# Patient Record
Sex: Male | Born: 1965 | Race: White | Hispanic: No | Smoking: Former smoker
Health system: Southern US, Community
[De-identification: ages and names within clinical notes are randomized; demographics above are authoritative.]

## PROBLEM LIST (undated history)

## (undated) DIAGNOSIS — E785 Hyperlipidemia, unspecified: Secondary | ICD-10-CM

## (undated) DIAGNOSIS — R350 Frequency of micturition: Secondary | ICD-10-CM

## (undated) DIAGNOSIS — B20 Human immunodeficiency virus [HIV] disease: Secondary | ICD-10-CM

## (undated) DIAGNOSIS — M1990 Unspecified osteoarthritis, unspecified site: Secondary | ICD-10-CM

## (undated) DIAGNOSIS — Z9889 Other specified postprocedural states: Secondary | ICD-10-CM

## (undated) DIAGNOSIS — K219 Gastro-esophageal reflux disease without esophagitis: Secondary | ICD-10-CM

## (undated) DIAGNOSIS — Z21 Asymptomatic human immunodeficiency virus [HIV] infection status: Secondary | ICD-10-CM

## (undated) DIAGNOSIS — I10 Essential (primary) hypertension: Secondary | ICD-10-CM

## (undated) DIAGNOSIS — R21 Rash and other nonspecific skin eruption: Secondary | ICD-10-CM

## (undated) DIAGNOSIS — K648 Other hemorrhoids: Secondary | ICD-10-CM

## (undated) DIAGNOSIS — N3941 Urge incontinence: Secondary | ICD-10-CM

## (undated) DIAGNOSIS — B181 Chronic viral hepatitis B without delta-agent: Secondary | ICD-10-CM

## (undated) DIAGNOSIS — R112 Nausea with vomiting, unspecified: Secondary | ICD-10-CM

## (undated) HISTORY — DX: Other hemorrhoids: K64.8

## (undated) HISTORY — PX: FOOT SURGERY: SHX648

## (undated) HISTORY — DX: Asymptomatic human immunodeficiency virus (hiv) infection status: Z21

## (undated) HISTORY — DX: Chronic viral hepatitis B without delta-agent: B18.1

## (undated) HISTORY — DX: Essential (primary) hypertension: I10

## (undated) HISTORY — DX: Hyperlipidemia, unspecified: E78.5

## (undated) HISTORY — DX: Human immunodeficiency virus (HIV) disease: B20

---
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
## 1995-11-25 HISTORY — PX: OTHER SURGICAL HISTORY: SHX169

## 1998-03-19 ENCOUNTER — Encounter: Admission: RE | Admit: 1998-03-19 | Discharge: 1998-03-19 | Payer: Self-pay | Admitting: Internal Medicine

## 1998-04-13 ENCOUNTER — Emergency Department (HOSPITAL_COMMUNITY): Admission: EM | Admit: 1998-04-13 | Discharge: 1998-04-13 | Payer: Self-pay | Admitting: *Deleted

## 1998-05-11 ENCOUNTER — Encounter: Admission: RE | Admit: 1998-05-11 | Discharge: 1998-05-11 | Payer: Self-pay | Admitting: Internal Medicine

## 1998-06-26 ENCOUNTER — Emergency Department (HOSPITAL_COMMUNITY): Admission: EM | Admit: 1998-06-26 | Discharge: 1998-06-26 | Payer: Self-pay | Admitting: Emergency Medicine

## 1998-07-09 ENCOUNTER — Encounter: Admission: RE | Admit: 1998-07-09 | Discharge: 1998-07-09 | Payer: Self-pay | Admitting: Internal Medicine

## 1998-08-14 ENCOUNTER — Emergency Department (HOSPITAL_COMMUNITY): Admission: EM | Admit: 1998-08-14 | Discharge: 1998-08-14 | Payer: Self-pay | Admitting: Emergency Medicine

## 1998-09-12 ENCOUNTER — Ambulatory Visit (HOSPITAL_COMMUNITY): Admission: RE | Admit: 1998-09-12 | Discharge: 1998-09-12 | Payer: Self-pay | Admitting: Internal Medicine

## 1998-09-16 ENCOUNTER — Emergency Department (HOSPITAL_COMMUNITY): Admission: EM | Admit: 1998-09-16 | Discharge: 1998-09-16 | Payer: Self-pay | Admitting: Emergency Medicine

## 1998-09-17 ENCOUNTER — Encounter: Payer: Self-pay | Admitting: Emergency Medicine

## 1998-09-19 ENCOUNTER — Emergency Department (HOSPITAL_COMMUNITY): Admission: EM | Admit: 1998-09-19 | Discharge: 1998-09-19 | Payer: Self-pay | Admitting: Emergency Medicine

## 1998-11-24 HISTORY — PX: OTHER SURGICAL HISTORY: SHX169

## 2004-11-24 HISTORY — PX: CHOLECYSTECTOMY: SHX55

## 2006-03-25 ENCOUNTER — Encounter: Payer: Self-pay | Admitting: Emergency Medicine

## 2008-11-24 HISTORY — PX: UMBILICAL HERNIA REPAIR: SHX196

## 2009-08-24 HISTORY — PX: HAMMER TOE SURGERY: SHX385

## 2012-02-09 ENCOUNTER — Emergency Department (HOSPITAL_COMMUNITY)
Admission: EM | Admit: 2012-02-09 | Discharge: 2012-02-10 | Disposition: A | Discharge status: Home / Self Care | Payer: PRIVATE HEALTH INSURANCE | Attending: Emergency Medicine | Admitting: Emergency Medicine

## 2012-02-09 ENCOUNTER — Other Ambulatory Visit (HOSPITAL_COMMUNITY): Payer: Self-pay | Admitting: Pharmacy Technician

## 2012-02-09 ENCOUNTER — Encounter (HOSPITAL_COMMUNITY): Payer: Self-pay | Admitting: *Deleted

## 2012-02-09 DIAGNOSIS — M79609 Pain in unspecified limb: Secondary | ICD-10-CM | POA: Insufficient documentation

## 2012-02-09 DIAGNOSIS — L299 Pruritus, unspecified: Secondary | ICD-10-CM | POA: Insufficient documentation

## 2012-02-09 DIAGNOSIS — Z21 Asymptomatic human immunodeficiency virus [HIV] infection status: Secondary | ICD-10-CM | POA: Insufficient documentation

## 2012-02-09 DIAGNOSIS — L0390 Cellulitis, unspecified: Secondary | ICD-10-CM

## 2012-02-09 HISTORY — DX: Asymptomatic human immunodeficiency virus (hiv) infection status: Z21

## 2012-02-09 LAB — POCT I-STAT, CHEM 8
BUN: 19 mg/dL (ref 6–23)
Creatinine, Ser: 1.2 mg/dL (ref 0.50–1.35)
Sodium: 144 mEq/L (ref 135–145)
TCO2: 22 mmol/L (ref 0–100)

## 2012-02-09 LAB — CBC
HCT: 40.7 % (ref 39.0–52.0)
Hemoglobin: 14.1 g/dL (ref 13.0–17.0)
MCH: 30.9 pg (ref 26.0–34.0)
MCHC: 34.6 g/dL (ref 30.0–36.0)
MCV: 89.3 fL (ref 78.0–100.0)
Platelets: 207 10*3/uL (ref 150–400)
RBC: 4.56 MIL/uL (ref 4.22–5.81)
RDW: 13 % (ref 11.5–15.5)
WBC: 10.4 10*3/uL (ref 4.0–10.5)

## 2012-02-09 LAB — DIFFERENTIAL
Basophils Absolute: 0 10*3/uL (ref 0.0–0.1)
Eosinophils Absolute: 0.2 10*3/uL (ref 0.0–0.7)
Eosinophils Relative: 2 % (ref 0–5)
Lymphocytes Relative: 37 % (ref 12–46)
Monocytes Absolute: 1.1 10*3/uL — ABNORMAL HIGH (ref 0.1–1.0)

## 2012-02-09 MED ORDER — HYDROMORPHONE HCL PF 1 MG/ML IJ SOLN
0.5000 mg | Freq: Once | INTRAMUSCULAR | Status: AC
  Administered 2012-02-09: 0.5 mg via INTRAVENOUS
  Filled 2012-02-09: qty 1

## 2012-02-09 MED ORDER — SODIUM CHLORIDE 0.9 % IV SOLN
Freq: Once | INTRAVENOUS | Status: AC
  Administered 2012-02-09: via INTRAVENOUS

## 2012-02-09 NOTE — ED Notes (Signed)
Pt states he has re occuring skin lesion.pt states they are painful at times and want he them examine

## 2012-02-10 ENCOUNTER — Ambulatory Visit (HOSPITAL_COMMUNITY)
Admission: RE | Admit: 2012-02-10 | Discharge: 2012-02-10 | Disposition: A | Discharge status: Home / Self Care | Payer: Medicare Other | Source: Ambulatory Visit | Attending: Emergency Medicine | Admitting: Emergency Medicine

## 2012-02-10 DIAGNOSIS — M79609 Pain in unspecified limb: Secondary | ICD-10-CM

## 2012-02-10 DIAGNOSIS — M79606 Pain in leg, unspecified: Secondary | ICD-10-CM

## 2012-02-10 DIAGNOSIS — M7989 Other specified soft tissue disorders: Secondary | ICD-10-CM | POA: Insufficient documentation

## 2012-02-10 MED ORDER — CEPHALEXIN 500 MG PO CAPS: 500.0000 mg | ORAL_CAPSULE | Freq: Four times a day (QID) | ORAL | Status: AC

## 2012-02-10 MED ORDER — OXYCODONE-ACETAMINOPHEN 10-325 MG PO TABS: 1.0000 | ORAL_TABLET | ORAL | Status: AC | PRN

## 2012-02-10 MED ORDER — ENOXAPARIN SODIUM 100 MG/ML ~~LOC~~ SOLN
100.0000 mg | Freq: Once | SUBCUTANEOUS | Status: AC
  Administered 2012-02-10: 100 mg via SUBCUTANEOUS
  Filled 2012-02-10: qty 1

## 2012-02-10 MED ORDER — CEPHALEXIN 500 MG PO CAPS
500.0000 mg | ORAL_CAPSULE | Freq: Once | ORAL | Status: AC
  Administered 2012-02-10: 500 mg via ORAL
  Filled 2012-02-10: qty 1

## 2012-02-10 NOTE — ED Provider Notes (Signed)
Pt had surgery on his left foot to repair a hammer toe and had a donor site from his left calf. Since then has had lesions/infections that have been treated by dermatologists in [place] and [place]. He states he has been on keflex/clindamycin oral and topical and other topical antibiotics, and he gets better but it returns. This episode started a week ago, no trauma or fever. He states he has itching deep inside and pain. He states his leg swells and gets better depending on how much he is up on it.  Pt has an area of firmness around the site that he states they obtained tissue to repair his hammer toe with mild redness and warmth.   Medical screening examination/treatment/procedure(s) were conducted as a shared visit with non-physician practitioner(s) and myself.  I personally evaluated the patient during the encounter Devoria Albe, MD, Franz Dell, MD 02/10/12 [phone]

## 2012-02-10 NOTE — ED Provider Notes (Signed)
See prior note   Ward Givens, MD 02/10/12 [phone]

## 2012-02-10 NOTE — Progress Notes (Signed)
VASCULAR LAB PRELIMINARY  PRELIMINARY  PRELIMINARY  PRELIMINARY  Left lower extremity venous duplex completed.    Preliminary report:  Left:  No evidence of DVT, superficial thrombosis, or Baker's cyst.  Carneshia Raker D, RVS 02/10/2012, 2:19 PM

## 2012-02-10 NOTE — ED Provider Notes (Signed)
History     CSN: [id number]  Arrival date & time 02/09/12  1819   First MD Initiated Contact with Patient 02/09/12 2115      Chief Complaint  Patient presents with  . Leg Pain    (Consider location/radiation/quality/duration/timing/severity/associated sxs/prior treatment) Patient is a 46 y.o. male presenting with leg pain. The history is provided by the patient.  Leg Pain  The incident occurred more than 2 days ago. There was no injury mechanism. Associated symptoms comments: The patient had a previous orthopedic procedure (left hammer toe correction with donor site from left leg) over 2 years ago and has had recurrent infections at or near the donor site since that time. He has been given topical as well as oral antibiotics at different times. Over the last week, he has had increasing pain and tightness in the leg similar to previous presentations without fever. He reports the pain is worse than usual. .    Past Medical History  Diagnosis Date  . HIV positive     Past Surgical History  Procedure Date  . Foot surgery     History reviewed. No pertinent family history.  History  Substance Use Topics  . Smoking status: Never Smoker   . Smokeless tobacco: Not on file  . Alcohol Use: No      Review of Systems  Constitutional: Negative for fever and chills.  HENT: Negative.   Respiratory: Negative.   Cardiovascular: Negative.   Gastrointestinal: Negative.   Musculoskeletal: Negative.        See HPI  Skin: Negative.   Neurological: Negative.     Allergies  Doxycycline and Lyrica  Home Medications   Current Outpatient Rx  Name Route Sig Dispense Refill  . DIAZEPAM 5 MG PO TABS Oral Take 10 mg by mouth at bedtime as needed.    . DULOXETINE HCL 60 MG PO CPEP Oral Take 120 mg by mouth at bedtime.    . EFAVIRENZ-EMTRICITAB-TENOFOVIR 600-200-300 MG PO TABS Oral Take 1 tablet by mouth at bedtime.    Marland Kitchen ESOMEPRAZOLE MAGNESIUM 40 MG PO CPDR Oral Take 40 mg by mouth daily  before breakfast.    . HYDROCODONE-ACETAMINOPHEN 7.5-325 MG PO TABS Oral Take 1 tablet by mouth every 6 (six) hours as needed. For pain relief    . ADULT MULTIVITAMIN W/MINERALS CH Oral Take 1 tablet by mouth daily.      BP 165/84  Pulse 86  Temp(Src) 98.8 F (37.1 C) (Oral)  Resp 16  Wt 221 lb (100.245 kg)  SpO2 98%  Physical Exam  Constitutional: He is oriented to person, place, and time. He appears well-developed and well-nourished.  Neck: Normal range of motion.  Pulmonary/Chest: Effort normal.  Musculoskeletal: Normal range of motion. He exhibits tenderness.       Left lower leg without gross swelling, or redness. There is a lesion medially at mid-shaft that is tender, tense in the immediate area surrounding the lesion. There is no drainage. Calf is soft but extremely tender. Distal pulses are present.   Neurological: He is alert and oriented to person, place, and time.  Skin: Skin is warm and dry. No erythema.  Psychiatric: He has a normal mood and affect.    ED Course  Procedures (including critical care time)  Labs Reviewed  DIFFERENTIAL - Abnormal; Notable for the following:    Monocytes Absolute 1.1 (*)    All other components within normal limits  CBC  D-DIMER, QUANTITATIVE  POCT I-STAT, CHEM 8  SEDIMENTATION  RATE  C-REACTIVE PROTEIN   No results found.   No diagnosis found.    MDM  Feel likely that this is recurrence of previous cutaneous infections given normal lab studies and history of same. Consider DVT despite negative D-dimer given degree of pain of calf so will get doppler study in the morning. Will discharge home with Keflex and increase pain medication to Percocet for better control.        Rodena Medin, PA-C 02/10/12 [phone]

## 2012-02-10 NOTE — Discharge Instructions (Signed)
GO TO [facility] TOMORROW AT 1:00 TO HAVE A DOPPLER STUDY OF LOWER LEG. STOP VICODIN AND USE PERCOCET FOR PAIN AS DIRECTED. TAKE KEFLEX AS DIRECTED FOR INFECTION. RETURN HERE WITH ANY SEVERE PAIN, HIGH FEVER OR NEW CONCERN.

## 2012-05-04 ENCOUNTER — Other Ambulatory Visit: Payer: Self-pay | Admitting: Urology

## 2012-06-11 ENCOUNTER — Encounter (HOSPITAL_BASED_OUTPATIENT_CLINIC_OR_DEPARTMENT_OTHER): Payer: Self-pay | Admitting: *Deleted

## 2012-06-11 NOTE — Progress Notes (Signed)
NPO AFTER MN. ARRIVES AT 0615. NEEDS ISTAT (HIV +)  AND EKG.

## 2012-06-16 NOTE — Anesthesia Preprocedure Evaluation (Addendum)
Anesthesia Evaluation  Patient identified by MRN, date of birth, ID band Patient awake    Reviewed: Allergy & Precautions, H&P , NPO status , Patient's Chart, lab work & pertinent test results  History of Anesthesia Complications (+) PONV  Airway Mallampati: II TM Distance: >3 FB Neck ROM: Full    Dental  (+) Teeth Intact, Caps, Partial Lower and Partial Upper   Pulmonary former smoker,  breath sounds clear to auscultation  Pulmonary exam normal       Cardiovascular negative cardio ROS  Rhythm:Regular Rate:Normal     Neuro/Psych negative neurological ROS  negative psych ROS   GI/Hepatic Neg liver ROS, GERD-  Medicated,  Endo/Other  Morbid obesity  Renal/GU negative Renal ROS  negative genitourinary   Musculoskeletal negative musculoskeletal ROS (+)   Abdominal   Peds negative pediatric ROS (+)  Hematology  (+) HIV,   Anesthesia Other Findings   Reproductive/Obstetrics negative OB ROS                           Anesthesia Physical Anesthesia Plan  ASA: III  Anesthesia Plan: MAC   Post-op Pain Management:    Induction: Intravenous  Airway Management Planned: Simple Face Mask  Additional Equipment:   Intra-op Plan:   Post-operative Plan:   Informed Consent: I have reviewed the patients History and Physical, chart, labs and discussed the procedure including the risks, benefits and alternatives for the proposed anesthesia with the patient or authorized representative who has indicated his/her understanding and acceptance.   Dental advisory given  Plan Discussed with: CRNA  Anesthesia Plan Comments:         Anesthesia Quick Evaluation

## 2012-06-16 NOTE — H&P (Signed)
History of Present Illness   Mr. Sky is ecstatic about the Interstim PNE test stimulation on the left side. He never tried the right. He is now voiding only 7 times a day and his urge incontinence and frequency was really severe, voiding up to 3-4 times in an hour. His urgency has settling as well. He has failed multiple medications. His mild decrease in flow is stable. Review of systems: No change in bowel or neurologic status.   Urinalysis: I reviewed, negative.  Incisions look fine and the PNE was removed.   He is allergic to penicillin and Vancomycin will be given preoperatively.    Past Medical History Problems  1. History of  Arthritis V13.4 2. History of  Hepatitis, C Virus 070.70 3. History of  HIV Infection 042 4. 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 History of  Nephrolithiasis V13.01 6. History of  Nocturia 788.43  Current Meds 1. Acetaminophen 325 MG Oral Tablet; Therapy: (Recorded:20Mar2013) to 2. Amitriptyline HCl 10 MG Oral Tablet; Therapy: (Recorded:20Mar2013) to 3. Atripla 600-200-300 MG Oral Tablet; Therapy: (Recorded:20Mar2013) to 4. Cialis 20 MG Oral Tablet; TAKE 1 TABLET 1 HOUR BEFORE ACTIVITY AS NEEDED; Therapy:  08May2013 to (Last Rx:08May2013) 5. Diazepam 5 MG Oral Tablet; Therapy: (Recorded:20Mar2013) to 6. Ditropan XL 15 MG Oral Tablet Extended Release 24 Hour; Therapy: (Recorded:20Mar2013) to 7. Flomax 0.4 MG Oral Capsule; Therapy: (Recorded:20Mar2013) to 8. Hydrocodone-Acetaminophen 7.5-500 MG Oral Tablet; Therapy: (Recorded:20Mar2013) to 9. NexIUM 40 MG Oral Capsule Delayed Release; Therapy: (Recorded:20Mar2013) to  Allergies Medication  1. Ampicillin CAPS 2. Doxycycline Hyclate CAPS 3. Penicillins 4. Tetracyclines  Social History Problems  1. Marital History - Single 2. Occupation: disabled  Results/Data  Urine [Data Includes: Last 1 Day]   10Jun2013  COLOR YELLOW   APPEARANCE CLEAR   SPECIFIC GRAVITY 1.030   pH 6.0     GLUCOSE NEG mg/dL  BILIRUBIN NEG   KETONE NEG mg/dL  BLOOD NEG   PROTEIN NEG mg/dL  UROBILINOGEN 0.2 mg/dL  NITRITE NEG   LEUKOCYTE ESTERASE NEG    Assessment Assessed  1. Urinary Frequency 788.41 2. 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 Brandis would like to have Interstim. It will be scheduled.   After a thorough review of the management options for the patient's condition the patient  elected to proceed with surgical therapy as noted above. We have discussed the potential benefits and risks of the procedure, side effects of the proposed treatment, the likelihood of the patient achieving the goals of the procedure, and any potential problems that might occur during the procedure or recuperation. Informed consent has been obtained.

## 2012-06-17 ENCOUNTER — Ambulatory Visit (HOSPITAL_BASED_OUTPATIENT_CLINIC_OR_DEPARTMENT_OTHER): Payer: Medicare Other | Admitting: Anesthesiology

## 2012-06-17 ENCOUNTER — Encounter (HOSPITAL_BASED_OUTPATIENT_CLINIC_OR_DEPARTMENT_OTHER)
Admission: RE | Disposition: A | Discharge status: Home / Self Care | Payer: Self-pay | Source: Ambulatory Visit | Attending: Urology

## 2012-06-17 ENCOUNTER — Encounter (HOSPITAL_BASED_OUTPATIENT_CLINIC_OR_DEPARTMENT_OTHER): Payer: Self-pay | Admitting: *Deleted

## 2012-06-17 ENCOUNTER — Ambulatory Visit (HOSPITAL_BASED_OUTPATIENT_CLINIC_OR_DEPARTMENT_OTHER)
Admission: RE | Admit: 2012-06-17 | Discharge: 2012-06-17 | Disposition: A | Discharge status: Home / Self Care | Payer: Medicare Other | Source: Ambulatory Visit | Attending: Urology | Admitting: Urology

## 2012-06-17 ENCOUNTER — Encounter (HOSPITAL_BASED_OUTPATIENT_CLINIC_OR_DEPARTMENT_OTHER): Payer: Self-pay | Admitting: Anesthesiology

## 2012-06-17 ENCOUNTER — Ambulatory Visit (HOSPITAL_COMMUNITY): Payer: Medicare Other

## 2012-06-17 DIAGNOSIS — Z87442 Personal history of urinary calculi: Secondary | ICD-10-CM | POA: Insufficient documentation

## 2012-06-17 DIAGNOSIS — B1920 Unspecified viral hepatitis C without hepatic coma: Secondary | ICD-10-CM | POA: Insufficient documentation

## 2012-06-17 DIAGNOSIS — R351 Nocturia: Secondary | ICD-10-CM | POA: Insufficient documentation

## 2012-06-17 DIAGNOSIS — N3941 Urge incontinence: Secondary | ICD-10-CM | POA: Insufficient documentation

## 2012-06-17 DIAGNOSIS — B20 Human immunodeficiency virus [HIV] disease: Secondary | ICD-10-CM | POA: Insufficient documentation

## 2012-06-17 DIAGNOSIS — R350 Frequency of micturition: Secondary | ICD-10-CM | POA: Insufficient documentation

## 2012-06-17 DIAGNOSIS — Z79899 Other long term (current) drug therapy: Secondary | ICD-10-CM | POA: Insufficient documentation

## 2012-06-17 DIAGNOSIS — N318 Other neuromuscular dysfunction of bladder: Secondary | ICD-10-CM | POA: Insufficient documentation

## 2012-06-17 HISTORY — DX: Rash and other nonspecific skin eruption: R21

## 2012-06-17 HISTORY — DX: Nausea with vomiting, unspecified: R11.2

## 2012-06-17 HISTORY — DX: Urge incontinence: N39.41

## 2012-06-17 HISTORY — DX: Other specified postprocedural states: Z98.890

## 2012-06-17 HISTORY — DX: Unspecified osteoarthritis, unspecified site: M19.90

## 2012-06-17 HISTORY — DX: Gastro-esophageal reflux disease without esophagitis: K21.9

## 2012-06-17 HISTORY — PX: INTERSTIM IMPLANT PLACEMENT: SHX5130

## 2012-06-17 HISTORY — DX: Frequency of micturition: R35.0

## 2012-06-17 LAB — POCT I-STAT 4, (NA,K, GLUC, HGB,HCT)
Glucose, Bld: 96 mg/dL (ref 70–99)
HCT: 45 % (ref 39.0–52.0)
Hemoglobin: 15.3 g/dL (ref 13.0–17.0)
Potassium: 3.9 mEq/L (ref 3.5–5.1)
Sodium: 144 mEq/L (ref 135–145)

## 2012-06-17 SURGERY — INSERTION, SACRAL NERVE STIMULATOR, INTERSTIM, STAGE 1
Anesthesia: Monitor Anesthesia Care | Site: Back | Wound class: Clean

## 2012-06-17 MED ORDER — CEPHALEXIN 250 MG PO CAPS: 250.0000 mg | ORAL_CAPSULE | Freq: Three times a day (TID) | ORAL | Status: AC

## 2012-06-17 MED ORDER — LACTATED RINGERS IV SOLN: INTRAVENOUS | Status: DC

## 2012-06-17 MED ORDER — HYDROCODONE-ACETAMINOPHEN 5-500 MG PO TABS: 1.0000 | ORAL_TABLET | Freq: Four times a day (QID) | ORAL | Status: DC | PRN

## 2012-06-17 MED ORDER — FENTANYL CITRATE 0.05 MG/ML IJ SOLN
INTRAMUSCULAR | Status: DC | PRN
  Administered 2012-06-17: 25 ug via INTRAVENOUS
  Administered 2012-06-17: 50 ug via INTRAVENOUS
  Administered 2012-06-17 (×3): 25 ug via INTRAVENOUS

## 2012-06-17 MED ORDER — PROMETHAZINE HCL 25 MG/ML IJ SOLN: 6.2500 mg | INTRAMUSCULAR | Status: DC | PRN

## 2012-06-17 MED ORDER — HYDROMORPHONE HCL PF 1 MG/ML IJ SOLN
0.2500 mg | INTRAMUSCULAR | Status: DC | PRN
  Administered 2012-06-17: 0.25 mg via INTRAVENOUS

## 2012-06-17 MED ORDER — SCOPOLAMINE 1 MG/3DAYS TD PT72
1.0000 | MEDICATED_PATCH | TRANSDERMAL | Status: DC
  Administered 2012-06-17: 1.5 mg via TRANSDERMAL

## 2012-06-17 MED ORDER — PROPOFOL 10 MG/ML IV EMUL
INTRAVENOUS | Status: DC | PRN
  Administered 2012-06-17: 25 ug/kg/min via INTRAVENOUS

## 2012-06-17 MED ORDER — PROPOFOL 10 MG/ML IV EMUL
INTRAVENOUS | Status: DC | PRN
  Administered 2012-06-17: 40 mg via INTRAVENOUS

## 2012-06-17 MED ORDER — BUPIVACAINE-EPINEPHRINE 0.5% -1:200000 IJ SOLN
INTRAMUSCULAR | Status: DC | PRN
  Administered 2012-06-17: 30 mL

## 2012-06-17 MED ORDER — VANCOMYCIN HCL IN DEXTROSE 1-5 GM/200ML-% IV SOLN
1000.0000 mg | INTRAVENOUS | Status: AC
  Administered 2012-06-17: 1000 mg via INTRAVENOUS

## 2012-06-17 MED ORDER — LACTATED RINGERS IV SOLN
INTRAVENOUS | Status: DC
  Administered 2012-06-17: 07:00:00 via INTRAVENOUS

## 2012-06-17 MED ORDER — STERILE WATER FOR IRRIGATION IR SOLN
Status: DC | PRN
  Administered 2012-06-17: 500 mL/h

## 2012-06-17 MED ORDER — ONDANSETRON HCL 4 MG/2ML IJ SOLN
INTRAMUSCULAR | Status: DC | PRN
  Administered 2012-06-17: 4 mg via INTRAVENOUS

## 2012-06-17 MED ORDER — MIDAZOLAM HCL 5 MG/5ML IJ SOLN
INTRAMUSCULAR | Status: DC | PRN
  Administered 2012-06-17: 2 mg via INTRAVENOUS

## 2012-06-17 MED ORDER — GENTAMICIN IN SALINE 1.6-0.9 MG/ML-% IV SOLN
80.0000 mg | INTRAVENOUS | Status: AC
  Administered 2012-06-17: 80 mg via INTRAVENOUS

## 2012-06-17 MED ORDER — LIDOCAINE-EPINEPHRINE (PF) 1 %-1:200000 IJ SOLN
INTRAMUSCULAR | Status: DC | PRN
  Administered 2012-06-17: 30 mL

## 2012-06-17 SURGICAL SUPPLY — 53 items
BAG URINE DRAINAGE (UROLOGICAL SUPPLIES) ×2 IMPLANT
BAG URINE LEG 500ML (DRAIN) IMPLANT
BANDAGE ADHESIVE 1X3 (GAUZE/BANDAGES/DRESSINGS) IMPLANT
BENZOIN TINCTURE PRP APPL 2/3 (GAUZE/BANDAGES/DRESSINGS) ×4 IMPLANT
BLADE HEX COATED 2.75 (ELECTRODE) ×2 IMPLANT
BLADE SURG 15 STRL LF DISP TIS (BLADE) ×1 IMPLANT
BLADE SURG 15 STRL SS (BLADE) ×1
CATH FOLEY 2WAY SLVR  5CC 16FR (CATHETERS) ×1
CATH FOLEY 2WAY SLVR 5CC 16FR (CATHETERS) ×1 IMPLANT
CLOTH BEACON ORANGE TIMEOUT ST (SAFETY) ×2 IMPLANT
COVER MAYO STAND STRL (DRAPES) ×2 IMPLANT
COVER PROBE W GEL 5X96 (DRAPES) ×2 IMPLANT
COVER TABLE BACK 60X90 (DRAPES) ×2 IMPLANT
DERMABOND ADVANCED (GAUZE/BANDAGES/DRESSINGS) ×1
DERMABOND ADVANCED .7 DNX12 (GAUZE/BANDAGES/DRESSINGS) ×1 IMPLANT
DRAPE C-ARM 42X72 X-RAY (DRAPES) ×2 IMPLANT
DRAPE INCISE 23X17 IOBAN STRL (DRAPES) ×1
DRAPE INCISE IOBAN 23X17 STRL (DRAPES) ×1 IMPLANT
DRAPE LAPAROSCOPIC ABDOMINAL (DRAPES) ×2 IMPLANT
DRAPE LG THREE QUARTER DISP (DRAPES) ×2 IMPLANT
DRESSING TELFA 8X3 (GAUZE/BANDAGES/DRESSINGS) ×2 IMPLANT
DRSG TEGADERM 4X4.75 (GAUZE/BANDAGES/DRESSINGS) ×2 IMPLANT
ELECT REM PT RETURN 9FT ADLT (ELECTROSURGICAL) ×2
ELECTRODE REM PT RTRN 9FT ADLT (ELECTROSURGICAL) ×1 IMPLANT
GAUZE SPONGE 4X4 12PLY STRL LF (GAUZE/BANDAGES/DRESSINGS) ×4 IMPLANT
GLOVE BIO SURGEON STRL SZ7.5 (GLOVE) ×2 IMPLANT
GLOVE INDICATOR 6.5 STRL GRN (GLOVE) ×2 IMPLANT
GOWN PREVENTION PLUS LG XLONG (DISPOSABLE) ×2 IMPLANT
GOWN STRL REIN XL XLG (GOWN DISPOSABLE) ×2 IMPLANT
HOLDER FOLEY CATH W/STRAP (MISCELLANEOUS) IMPLANT
INTRODUCER GUIDE DILATR SHEATH (SET/KITS/TRAYS/PACK) ×2 IMPLANT
LEAD (Lead) ×2 IMPLANT
NEEDLE FORAMEN 20GA 3.5  9CM (NEEDLE) IMPLANT
NEEDLE FORAMEN 20GA 5  12.5CM (NEEDLE) IMPLANT
NEEDLE HYPO 22GX1.5 SAFETY (NEEDLE) ×2 IMPLANT
PACK BASIN DAY SURGERY FS (CUSTOM PROCEDURE TRAY) ×2 IMPLANT
PENCIL BUTTON HOLSTER BLD 10FT (ELECTRODE) ×2 IMPLANT
PROGRAMMER ANTENNA EXT (UROLOGICAL SUPPLIES) ×2 IMPLANT
PROGRAMMER STIMUL 2.2X1.1X3.7 (UROLOGICAL SUPPLIES) ×2 IMPLANT
STAPLER VISISTAT 35W (STAPLE) IMPLANT
STIMULATOR INTERSTIM 2X1.7X.3 (Orthopedic Implant) ×2 IMPLANT
STRIP CLOSURE SKIN 1/2X4 (GAUZE/BANDAGES/DRESSINGS) ×2 IMPLANT
SUT SILK 2 0 (SUTURE) ×1
SUT SILK 2-0 18XBRD TIE 12 (SUTURE) ×1 IMPLANT
SUT VIC AB 3-0 SH 27 (SUTURE) ×2
SUT VIC AB 3-0 SH 27X BRD (SUTURE) ×2 IMPLANT
SUT VICRYL 4-0 PS2 18IN ABS (SUTURE) ×4 IMPLANT
SYR BULB IRRIGATION 50ML (SYRINGE) ×2 IMPLANT
SYR CONTROL 10ML LL (SYRINGE) ×2 IMPLANT
SYRINGE 10CC LL (SYRINGE) ×2 IMPLANT
TOWEL OR 17X24 6PK STRL BLUE (TOWEL DISPOSABLE) ×4 IMPLANT
TRAY DSU PREP LF (CUSTOM PROCEDURE TRAY) ×2 IMPLANT
WATER STERILE IRR 500ML POUR (IV SOLUTION) ×2 IMPLANT

## 2012-06-17 NOTE — Transfer of Care (Addendum)
Immediate Anesthesia Transfer of Care Note  Patient: Kevin Pena  Procedure(s) Performed: Procedure(s) (LRB): INTERSTIM IMPLANT FIRST STAGE (N/A) INTERSTIM IMPLANT SECOND STAGE (N/A)  Patient Location: PACU  Anesthesia Type: MAC  Level of Consciousness: awake, alert , oriented and patient cooperative  Airway & Oxygen Therapy: Patient Spontanous Breathing and Patient connected to face mask oxygen  Post-op Assessment: Report given to PACU RN and Post -op Vital signs reviewed and stable  Post vital signs: Reviewed and stable  Complications: No apparent anesthesia complications

## 2012-06-17 NOTE — Progress Notes (Signed)
Kevin Pena w [facility] @ bedside to adjust & discuss neurostimulator w pt. Pt voices understanding.

## 2012-06-17 NOTE — Interval H&P Note (Signed)
History and Physical Interval Note:  06/17/2012 7:26 AM  Kevin Pena  has presented today for surgery, with the diagnosis of URGE INCONTINENCE AND FREQUENCY  The various methods of treatment have been discussed with the patient and family. After consideration of risks, benefits and other options for treatment, the patient has consented to  Procedure(s) (LRB): INTERSTIM IMPLANT FIRST STAGE (N/A) INTERSTIM IMPLANT SECOND STAGE (N/A) as a surgical intervention .  The patient's history has been reviewed, patient examined, no change in status, stable for surgery.  I have reviewed the patient's chart and labs.  Questions were answered to the patient's satisfaction.     Kevin Pena A

## 2012-06-17 NOTE — H&P (View-Only) (Signed)
NPO AFTER MN. ARRIVES AT 0615. NEEDS ISTAT (HIV +)  AND EKG.  

## 2012-06-17 NOTE — Progress Notes (Signed)
Ready for d/c to home.  Waiting for Dr. Sherron Monday to see. Pt aware Dr. Sherron Monday in OR at this time.

## 2012-06-17 NOTE — Op Note (Signed)
Preoperative diagnosis: Refractory urge incontinence Postoperative diagnosis: Refractory urge incontinence Surgery: Implantation of InterStim device stage I and 2 Surgeon: Dr. Lorin Picket Merrill Villarruel  The patient has the above diagnoses. Preoperative antibiotics were given. He has hepatitis C and HIV positive. Preoperative skin prep was performed with the patient in the prone position.-like anesthesia was utilized as the patient had a lot of reflux and or coughing during the procedure  Under fluoroscopic guidance I marked the S3 foramina with 3.5 inch framing needles. With AP and lateral views I easily placed the framing needle through the S3 foramina. He had excellent toe and bellows response   I removed the inner wire and placed the introducer to the appropriate depth removing the formamin needle. I passed the white introducer to the appropriate depth. I removed the inner core and placed the lead to the appropriate depth with positioning to in 3 bridging the bone table. He had excellent bellows and toe at all 4-lead position. Fluoroscopically a removed the inner needle of the lead and the introducer sheath. Lead was retested  I made a 4 cm left buttock incision approximately at his hand pocket position. I made an appropriate pocket after instilling 15 cc of and epinephrine lidocaine mixture. I delivered the lead with the delivery device and connected to the IPG with a screwdriver. He was laid in place nicely. Impedance was checked in all 4 positions and there were normal.  All incisions were irrigated. I closed the midline incision with interrupted 4-0 Vicryl. I closed the lateral incision with 3-0 Vicryl subcutaneous followed by 4-0 subcuticular. Dermabond dressing was applied since the patient is not tolerate tape very well  X-rays were taken. I was very pleased the procedure.

## 2012-06-17 NOTE — Progress Notes (Signed)
Dr. Sherron Monday in and talked w/pt .  Verbal d/c instructions reviewed by dr. Sherron Monday.

## 2012-06-17 NOTE — Anesthesia Postprocedure Evaluation (Signed)
Anesthesia Post Note  Patient: Kevin Pena  Procedure(s) Performed: Procedure(s) (LRB): INTERSTIM IMPLANT FIRST STAGE (N/A) INTERSTIM IMPLANT SECOND STAGE (N/A)  Anesthesia type: MAC  Patient location: PACU  Post pain: Pain level controlled  Post assessment: Post-op Vital signs reviewed  Last Vitals:  Filed Vitals:   06/17/12 0930  BP: 110/72  Pulse: 73  Temp:   Resp: 24    Post vital signs: Reviewed  Level of consciousness: sedated  Complications: No apparent anesthesia complications

## 2012-06-19 ENCOUNTER — Encounter (HOSPITAL_COMMUNITY): Payer: Self-pay | Admitting: *Deleted

## 2012-06-19 ENCOUNTER — Other Ambulatory Visit: Payer: Self-pay

## 2012-06-19 ENCOUNTER — Emergency Department (HOSPITAL_COMMUNITY)
Admission: EM | Admit: 2012-06-19 | Discharge: 2012-06-20 | Disposition: A | Discharge status: Home / Self Care | Payer: Medicare Other | Attending: Emergency Medicine | Admitting: Emergency Medicine

## 2012-06-19 ENCOUNTER — Emergency Department (HOSPITAL_COMMUNITY): Payer: Medicare Other

## 2012-06-19 DIAGNOSIS — R05 Cough: Secondary | ICD-10-CM | POA: Insufficient documentation

## 2012-06-19 DIAGNOSIS — R059 Cough, unspecified: Secondary | ICD-10-CM | POA: Insufficient documentation

## 2012-06-19 DIAGNOSIS — Z79899 Other long term (current) drug therapy: Secondary | ICD-10-CM | POA: Insufficient documentation

## 2012-06-19 DIAGNOSIS — K219 Gastro-esophageal reflux disease without esophagitis: Secondary | ICD-10-CM | POA: Insufficient documentation

## 2012-06-19 DIAGNOSIS — Z21 Asymptomatic human immunodeficiency virus [HIV] infection status: Secondary | ICD-10-CM | POA: Insufficient documentation

## 2012-06-19 NOTE — ED Notes (Signed)
Patient transported to X-ray 

## 2012-06-19 NOTE — ED Notes (Signed)
Per pt report: pt had surgery earlier this week and had a stent put.  He now is c/o of coughing up white mucus and has pain in this epigastric area and radiates to the middle of his back every time he coughs.  No pain when he breathes in deeply.

## 2012-06-19 NOTE — ED Notes (Signed)
Pt reports coughing up white mucus.

## 2012-06-20 NOTE — ED Provider Notes (Signed)
History     CSN: [id number]  Arrival date & time 06/19/12  2133   First MD Initiated Contact with Patient 06/19/12 2248      No chief complaint on file.   (Consider location/radiation/quality/duration/timing/severity/associated sxs/prior treatment) HPI Comments: Patient state he had a neurostimulator placed July, 25th he was fine until early this evening when he developed a cough with clear to white sputum.  He denies any fever.  Tachycardia, tachypnea, runny nose.  He does, state when he coughs it hurts, his throat.  States he does not know if he was intubated during his surgical procedure, but was told they would try not to close.  It was a short procedure  The history is provided by the patient.    Past Medical History  Diagnosis Date  . HIV positive   . Urge urinary incontinence   . Frequency of urination   . GERD (gastroesophageal reflux disease)   . PONV (postoperative nausea and vomiting)   . Arthritis   . Rash left leg-- using topical cream    Past Surgical History  Procedure Date  . Foot surgery   . Appendectomy 1981  . Cholecystectomy 2006  . Right inguinal hernia repair 1997  . Repair recurrent right inguinal hernia 2000  . Umbilical hernia repair 2010  . Hammer toe surgery OCT 2010    No family history on file.  History  Substance Use Topics  . Smoking status: Former Smoker -- 1.0 packs/day for 30 years    Types: Cigarettes    Quit date: 06/11/2010  . Smokeless tobacco: Never Used  . Alcohol Use: Yes     RARE      Review of Systems  Constitutional: Negative for fever and chills.  HENT: Negative for rhinorrhea.   Respiratory: Positive for cough. Negative for shortness of breath.   Neurological: Negative for weakness.    Allergies  Adhesive; Ampicillin; Doxycycline; and Lyrica  Home Medications   Current Outpatient Rx  Name Route Sig Dispense Refill  . CEPHALEXIN 250 MG PO CAPS Oral Take 1 capsule (250 mg total) by mouth 3 (three) times  daily. 15 capsule 0  . DIAZEPAM 5 MG PO TABS Oral Take 10 mg by mouth at bedtime. scheduled    . DULOXETINE HCL 60 MG PO CPEP Oral Take 120 mg by mouth at bedtime.    . EFAVIRENZ-EMTRICITAB-TENOFOVIR 600-200-300 MG PO TABS Oral Take 1 tablet by mouth at bedtime.    Marland Kitchen ESOMEPRAZOLE MAGNESIUM 40 MG PO CPDR Oral Take 40 mg by mouth every evening.     Marland Kitchen HYDROCODONE-ACETAMINOPHEN 7.5-325 MG PO TABS Oral Take 2 tablets by mouth every 6 (six) hours as needed. For pain relief    . IBUPROFEN 800 MG PO TABS Oral Take 800 mg by mouth every 8 (eight) hours as needed. For knee pain      BP 137/87  Pulse 72  Temp 98.4 F (36.9 C) (Oral)  Resp 16  SpO2 99%  Physical Exam  Constitutional: He appears well-developed.  HENT:  Head: Normocephalic.  Eyes: Pupils are equal, round, and reactive to light.  Neck: Normal range of motion.  Cardiovascular: Normal rate.   Pulmonary/Chest: Effort normal. No respiratory distress. He has no wheezes. He exhibits no tenderness.  Abdominal: Soft. He exhibits no distension.  Musculoskeletal: Normal range of motion.  Neurological: He is alert.  Skin: Skin is warm and dry. No rash noted. No erythema.    ED Course  Procedures (including critical care time)  Labs Reviewed - No data to display Dg Chest 2 View  06/20/2012  *RADIOLOGY REPORT*  Clinical Data: Surgery.  Chest tightness.  CHEST - 2 VIEW  Comparison: 11/14/2011  Findings: Stable nodular density projecting over the posterolateral right fifth rib. Lungs otherwise clear.  Normal heart size.  No pneumothorax.  No pleural fluid.  IMPRESSION: No active cardiopulmonary disease.  Stable chronic changes.  Original Report Authenticated By: Donavan Burnet, M.D.     1. Cough       MDM  Physical exam, is normal.  No sign of any respiratory distress.  No rales, rhonchi, no coughing, while examination, taking, place, will obtain chest x-ray to to patient's history and recent surgical procedure if this is, normal.   We'll discharge home        Arman Filter, NP 06/20/12 0125  Arman Filter, NP 06/20/12 [phone]

## 2012-06-20 NOTE — ED Provider Notes (Signed)
Medical screening examination/treatment/procedure(s) were performed by non-physician practitioner and as supervising physician I was immediately available for consultation/collaboration.   Celene Kras, MD 06/20/12 [phone]

## 2012-06-21 ENCOUNTER — Encounter (HOSPITAL_BASED_OUTPATIENT_CLINIC_OR_DEPARTMENT_OTHER): Payer: Self-pay | Admitting: Urology

## 2012-06-22 ENCOUNTER — Encounter (HOSPITAL_BASED_OUTPATIENT_CLINIC_OR_DEPARTMENT_OTHER): Payer: Self-pay

## 2014-03-29 DIAGNOSIS — E785 Hyperlipidemia, unspecified: Secondary | ICD-10-CM | POA: Insufficient documentation

## 2014-03-29 DIAGNOSIS — E782 Mixed hyperlipidemia: Secondary | ICD-10-CM | POA: Insufficient documentation

## 2014-03-29 DIAGNOSIS — I10 Essential (primary) hypertension: Secondary | ICD-10-CM

## 2014-03-29 DIAGNOSIS — L03119 Cellulitis of unspecified part of limb: Secondary | ICD-10-CM | POA: Insufficient documentation

## 2014-03-29 HISTORY — DX: Essential (primary) hypertension: I10

## 2014-03-29 HISTORY — DX: Mixed hyperlipidemia: E78.2

## 2014-06-12 ENCOUNTER — Encounter: Payer: Self-pay | Admitting: Gastroenterology

## 2014-07-27 ENCOUNTER — Telehealth: Payer: Self-pay | Admitting: Gastroenterology

## 2014-07-27 ENCOUNTER — Ambulatory Visit (AMBULATORY_SURGERY_CENTER): Payer: Self-pay

## 2014-07-27 VITALS — Ht 68.5 in | Wt 211.4 lb

## 2014-07-27 DIAGNOSIS — Z1211 Encounter for screening for malignant neoplasm of colon: Secondary | ICD-10-CM

## 2014-07-27 MED ORDER — SUPREP BOWEL PREP KIT 17.5-3.13-1.6 GM/177ML PO SOLN: 1.0000 | Freq: Once | ORAL | Status: DC

## 2014-07-27 NOTE — Progress Notes (Signed)
No allergies to eggs or soy No past problems with anesthesia except PONV with general and cons sed No home oxygen No diet/weight loss meds  Has email  Emmi instructions given for colonoscopy

## 2014-07-27 NOTE — Telephone Encounter (Signed)
Pt will need to be cancelled until MD can receive and review records.  Pt plans to fax now to 3rd floor.  Attn Merri Ray.

## 2014-07-28 NOTE — Telephone Encounter (Signed)
We received records. Will put on Dr Nita Sells desk

## 2014-08-03 ENCOUNTER — Telehealth: Payer: Self-pay | Admitting: Gastroenterology

## 2014-08-03 NOTE — Telephone Encounter (Signed)
Rec'd from Dr. Lorita Officer forward 21 pages to Dr.Kaplan

## 2014-08-04 NOTE — Telephone Encounter (Signed)
Sent records to be scanned in [facility]

## 2014-08-04 NOTE — Telephone Encounter (Signed)
Put recall in for 2020  L/M for patient

## 2014-08-04 NOTE — Telephone Encounter (Signed)
Called pt to inform per Dr Arlyce Dice he is not due for a colonoscopy until 2020  L/M for him to return call

## 2014-08-10 ENCOUNTER — Encounter: Payer: PRIVATE HEALTH INSURANCE | Admitting: Gastroenterology

## 2014-09-01 ENCOUNTER — Encounter: Payer: Self-pay | Admitting: Diagnostic Neuroimaging

## 2014-09-01 ENCOUNTER — Encounter (INDEPENDENT_AMBULATORY_CARE_PROVIDER_SITE_OTHER): Payer: Self-pay

## 2014-09-01 ENCOUNTER — Ambulatory Visit (INDEPENDENT_AMBULATORY_CARE_PROVIDER_SITE_OTHER): Payer: PRIVATE HEALTH INSURANCE | Admitting: Diagnostic Neuroimaging

## 2014-09-01 VITALS — BP 147/85 | HR 71 | Ht 68.5 in | Wt 210.2 lb

## 2014-09-01 DIAGNOSIS — M545 Low back pain, unspecified: Secondary | ICD-10-CM

## 2014-09-01 DIAGNOSIS — R194 Change in bowel habit: Secondary | ICD-10-CM

## 2014-09-01 DIAGNOSIS — F419 Anxiety disorder, unspecified: Secondary | ICD-10-CM

## 2014-09-01 DIAGNOSIS — B20 Human immunodeficiency virus [HIV] disease: Secondary | ICD-10-CM | POA: Insufficient documentation

## 2014-09-01 DIAGNOSIS — N3281 Overactive bladder: Secondary | ICD-10-CM

## 2014-09-01 DIAGNOSIS — A609 Anogenital herpesviral infection, unspecified: Secondary | ICD-10-CM

## 2014-09-01 DIAGNOSIS — K589 Irritable bowel syndrome without diarrhea: Secondary | ICD-10-CM | POA: Insufficient documentation

## 2014-09-01 HISTORY — DX: Anxiety disorder, unspecified: F41.9

## 2014-09-01 HISTORY — DX: Overactive bladder: N32.81

## 2014-09-01 HISTORY — DX: Anogenital herpesviral infection, unspecified: A60.9

## 2014-09-01 NOTE — Progress Notes (Signed)
[facility]  PATIENT: Kevin Pena DOB: [birth year]  REFERRING CLINICIAN: Henrene Pastor HISTORY FROM: patient  REASON FOR VISIT: new consult    HISTORICAL  CHIEF COMPLAINT:  Chief Complaint  Patient presents with  . Fall    HISTORY OF PRESENT ILLNESS:   48 year old right-handed male with HIV, hepatitis B, hypertension, here for evaluation of low back pain, bowel urgency.  08/19/2014 patient was standing on concrete floor, went to sit down but missed the chair. He fell straight down onto his buttocks. It severe low back pain near his tailbone. Since that time he has had pain in his low back, numbness radiating into the legs, as well as increased urge to have bowel movements. He increased number of bowel movements over last 2 weeks. He had one or 2 bowel movements accidents. No change in bladder symptoms.  Patient developed increased urgency for urination, and was treated by urology with bladder stimulator which has significantly improved symptoms.  Patient had CT of the lumbar spine on 08/28/14 which showed mild disc bulging and facet hypertrophy at L3-4 with mild spinal stenosis. No evidence of fracture.   REVIEW OF SYSTEMS: Full 14 system review of systems performed and notable only for swelling of legs blood in stool feeling hot joint pain and numbness but no sleep changes appetite.   ALLERGIES: Allergies  Allergen Reactions  . Adhesive [Tape] Other (See Comments)    SEVERE IRRITAION  . Ampicillin Nausea And Vomiting  . Doxycycline Rash  . Lyrica [Pregabalin] Nausea And Vomiting and Rash    HOME MEDICATIONS: Outpatient Prescriptions Prior to Visit  Medication Sig Dispense Refill  . atorvastatin (LIPITOR) 20 MG tablet Take 20 mg by mouth daily.      . diazepam (VALIUM) 5 MG tablet Take 10 mg by mouth at bedtime. scheduled      . efavirenz-emtrictabine-tenofovir (ATRIPLA) 600-200-300 MG per tablet Take 1 tablet by mouth at bedtime.      Marland Kitchen  esomeprazole (NEXIUM) 40 MG capsule Take 40 mg by mouth every evening.       . hydrochlorothiazide (HYDRODIURIL) 25 MG tablet Take 25 mg by mouth daily.      Marland Kitchen HYDROcodone-acetaminophen (NORCO) 7.5-325 MG per tablet Take 2 tablets by mouth every 6 (six) hours as needed. For pain relief      . ibuprofen (ADVIL,MOTRIN) 800 MG tablet Take 800 mg by mouth every 8 (eight) hours as needed. For knee pain      . SUPREP BOWEL PREP SOLN Take 1 kit by mouth once.  354 mL  0   No facility-administered medications prior to visit.    PAST MEDICAL HISTORY: Past Medical History  Diagnosis Date  . HIV positive 1986  . Urge urinary incontinence   . Frequency of urination   . GERD (gastroesophageal reflux disease)   . PONV (postoperative nausea and vomiting)   . Arthritis   . Rash left leg-- using topical cream  . Hepatitis B carrier 1986  . Hypertension     PAST SURGICAL HISTORY: Past Surgical History  Procedure Laterality Date  . Foot surgery    . Appendectomy  1981  . Cholecystectomy  2006  . Right inguinal hernia repair  1997  . Repair recurrent right inguinal hernia  2000  . Umbilical hernia repair  2010  . Hammer toe surgery  OCT 2010  . Interstim implant placement  06/17/2012    Procedure: INTERSTIM IMPLANT FIRST STAGE;  Surgeon: Reece Packer, MD;  Location: [facility];  Service: Urology;  Laterality: N/A;  RAD TECH OK PER VICKIE AT MAIN OR   . Interstim implant placement  06/17/2012    Procedure: INTERSTIM IMPLANT SECOND STAGE;  Surgeon: Reece Packer, MD;  Location: [facility];  Service: Urology;  Laterality: N/A;    FAMILY HISTORY: Family History  Problem Relation Age of Onset  . Colon cancer Neg Hx   . Pancreatic cancer Neg Hx   . Rectal cancer Neg Hx   . Stomach cancer Neg Hx   . Other Mother     colon resection, tumor near liver  . Heart Problems Father     SOCIAL HISTORY:  History   Social History  . Marital Status:  Significant Other    Spouse Name: Huntley Dec    Number of Children: 1  . Years of Education: College   Occupational History  .  Other    disability   Social History Main Topics  . Smoking status: Former Smoker -- 1.00 packs/day for 30 years    Types: Cigarettes    Quit date: 06/11/2010  . Smokeless tobacco: Former Systems developer  . Alcohol Use: Yes     Comment: RARE  . Drug Use: No  . Sexual Activity: Not on file   Other Topics Concern  . Not on file   Social History Narrative   Patient lives at home with partner.   Caffeine Use: 32oz daily coffee/tea     PHYSICAL EXAM  Filed Vitals:   09/01/14 1112  BP: 147/85  Pulse: 71  Height: 5' 8.5" (1.74 m)  Weight: 210 lb 3.2 oz (95.346 kg)    Not recorded    Body mass index is 31.49 kg/(m^2).  GENERAL EXAM: Patient is in no distress; well developed, nourished and groomed; neck is supple  CARDIOVASCULAR: Regular rate and rhythm, no murmurs, no carotid bruits  NEUROLOGIC: MENTAL STATUS: awake, alert, oriented to person, place and time, recent and remote memory intact, normal attention and concentration, language fluent, comprehension intact, naming intact, fund of knowledge appropriate CRANIAL NERVE: no papilledema on fundoscopic exam, pupils equal and reactive to light, visual fields full to confrontation, extraocular muscles intact, no nystagmus, facial sensation and strength symmetric, hearing intact, palate elevates symmetrically, uvula midline, shoulder shrug symmetric, tongue midline. MOTOR: normal bulk and tone, full strength in the BUE; LEFT HF LIMITED BY PAIN (4+). RIGHT DF (4). SENSORY: normal and symmetric to light touch, pinprick, temperature, vibration and proprioception COORDINATION: finger-nose-finger, fine finger movements normal REFLEXES: BUE TRACE, BLE 0. GAIT/STATION: SLIGHTLY ANTALGIC GAIT; DIFF WITH TOE WALK ON RIGHT; HEEL WALK STABLE. Romberg is negative    DIAGNOSTIC DATA (LABS, IMAGING, TESTING) - I  reviewed patient records, labs, notes, testing and imaging myself where available.  Lab Results  Component Value Date   WBC 10.4 02/09/2012   HGB 15.3 06/17/2012   HCT 45.0 06/17/2012   MCV 89.3 02/09/2012   PLT 207 02/09/2012      Component Value Date/Time   NA 144 06/17/2012 0656   K 3.9 06/17/2012 0656   CL 110 02/09/2012 2359   GLUCOSE 96 06/17/2012 0656   BUN 19 02/09/2012 2359   CREATININE 1.20 02/09/2012 2359   No results found for this basename: CHOL, HDL, LDLCALC, LDLDIRECT, TRIG, CHOLHDL   No results found for this basename: HGBA1C   No results found for this basename: VITAMINB12   No results found for this basename: TSH    I reviewed images myself and agree with  interpretation. -VRP  08/28/14 CT LUMBAR SPINE - No acute abnormality. Negative for fracture. Mild convex right scoliosis, unchanged. Shallow disc bulge and ligamentum flavum thickening at L3-4 cause mild central canal narrowing.   ASSESSMENT AND PLAN  48 y.o. year old male here with increased bowel urgency, ever since fall on 08/19/2014 on this bottom. CT lumbar spine shows no acute findings. However there is mild spinal stenosis at L3-4 related to disc bulging, facet and ligamentum flavum hypertrophy. Bowel symptoms may be related to lumbosacral root strain/stretch injury vs other GI etiology. Overall symptoms stable to slightly improving.   PLAN: - Advised observation for now. Patient cannot have MRI lumbar spine due to bladder stimulator. If symptoms significantly worsen may consider referral to neurosurgery for consideration of CT myelogram and surgical options. - consider GI evaluation for altered bowel habits; of note patient has history of lower GI tumor resection from 1992, which may need follow up  Return in about 6 weeks (around 10/13/2014).    Penni Bombard, MD 33/03/8250, 89:84 PM Certified in Neurology, Neurophysiology and Neuroimaging  [facility] [address] [place], [place] [address] [phone]

## 2014-09-12 ENCOUNTER — Telehealth: Payer: Self-pay | Admitting: Gastroenterology

## 2014-09-12 NOTE — Telephone Encounter (Signed)
Patient fell on a concrete floor a few weeks ago.  He has been having excess BMs, they feel may be some compression from the fall.  Last two days he has been having bright red rectal bleeding, several times a day.  He will come in and see Willette Clusteraula Guenther RNP on 09/13/14 2:30

## 2014-09-13 ENCOUNTER — Ambulatory Visit (INDEPENDENT_AMBULATORY_CARE_PROVIDER_SITE_OTHER): Payer: PRIVATE HEALTH INSURANCE | Admitting: Nurse Practitioner

## 2014-09-13 ENCOUNTER — Encounter: Payer: Self-pay | Admitting: Nurse Practitioner

## 2014-09-13 VITALS — BP 140/76 | HR 72 | Ht 68.5 in | Wt 212.2 lb

## 2014-09-13 DIAGNOSIS — R152 Fecal urgency: Secondary | ICD-10-CM

## 2014-09-13 DIAGNOSIS — R198 Other specified symptoms and signs involving the digestive system and abdomen: Secondary | ICD-10-CM

## 2014-09-13 DIAGNOSIS — B20 Human immunodeficiency virus [HIV] disease: Secondary | ICD-10-CM

## 2014-09-13 DIAGNOSIS — K625 Hemorrhage of anus and rectum: Secondary | ICD-10-CM

## 2014-09-13 DIAGNOSIS — Z21 Asymptomatic human immunodeficiency virus [HIV] infection status: Secondary | ICD-10-CM

## 2014-09-13 MED ORDER — PRAMOXINE HCL 1 % RE FOAM: 1.0000 "application " | Freq: Two times a day (BID) | RECTAL | Status: DC

## 2014-09-13 NOTE — Patient Instructions (Signed)
You have been scheduled for a flexible sigmoidoscopy. Please follow the written instructions given to you at your visit today. If you use inhalers (even only as needed), please bring them with you on the day of your procedure.   We have sent the following medications to your pharmacy for you to pick up at your convenience: Proctofoam , If this is too expensive call us back.  We are going to obtain your records from your PCP- Dr. Verdia KubaLawrence E. Perry for Willette ClusterPaula Guenther NP to review.   I appreciate the opportunity to care for you.

## 2014-09-15 ENCOUNTER — Encounter: Payer: Self-pay | Admitting: Nurse Practitioner

## 2014-09-15 DIAGNOSIS — R152 Fecal urgency: Secondary | ICD-10-CM | POA: Insufficient documentation

## 2014-09-15 HISTORY — DX: Fecal urgency: R15.2

## 2014-09-15 NOTE — Progress Notes (Signed)
HPI :  Patient is a 48 year old male, new to this practice, referred for evaluation of rectal bleeding and bowel changes. Patient Patient is HIV positive, on treatment and tells me his viral load if undetectable. He has a history of adenomatous polyps. He was followed for years by Dr.   Jennye BoroughsMisenheimer in [place]. He has also been seen by Dr. Chales AbrahamsGupta. Per patient, PCP wanted him to have a second opinion for urgency / ongoing bleeding.       Patient gives a history of IBS. He has had several colonoscopies with findings of hemorrhoids and small adenomatous polyps. Patient fell on his buttocks about one month ago. Since then he has developed problems with frequent urges to defecate and sometimes it so urgent that he has accidents. Stools are formed, no diarrhea. Rectal bleeding, a chronic problem, is random.  Post fall a CTscan of lumbar spine revealed mild disc bulging and facet hypertrophy at L3-L4. No fractures seen. Patient was evaluated by Neurology who felt bowel symptoms could be related to lumbosacral root strain / stretch injury but recommended GI evaluation.   Patient is HIV +, on meds. He denies history of AIDS. He hasn't had anal intercourse since 1990's                    Past Medical History  Diagnosis Date  . HIV positive 1986  . Urge urinary incontinence   . Frequency of urination   . GERD (gastroesophageal reflux disease)   . PONV (postoperative nausea and vomiting)   . Arthritis   . Rash left leg-- using topical cream  . Hepatitis B carrier 1986  . Hypertension     Family History  Problem Relation Age of Onset  . Colon cancer Neg Hx   . Pancreatic cancer Neg Hx   . Rectal cancer Neg Hx   . Stomach cancer Neg Hx   . Other Mother     colon resection, tumor near liver  . Heart Problems Father    History  Substance Use Topics  . Smoking status: Former Smoker -- 1.00 packs/day for 30 years    Types: Cigarettes    Quit date: 06/11/2010  . Smokeless tobacco: Former NeurosurgeonUser  .  Alcohol Use: Yes     Comment: RARE   Current Outpatient Prescriptions  Medication Sig Dispense Refill  . atorvastatin (LIPITOR) 20 MG tablet Take 20 mg by mouth daily.      . diazepam (VALIUM) 5 MG tablet Take 10 mg by mouth at bedtime. scheduled      . efavirenz-emtrictabine-tenofovir (ATRIPLA) 600-200-300 MG per tablet Take 1 tablet by mouth at bedtime.      Marland Kitchen. esomeprazole (NEXIUM) 40 MG capsule Take 40 mg by mouth every evening.       . hydrochlorothiazide (HYDRODIURIL) 25 MG tablet Take 25 mg by mouth daily.      Marland Kitchen. HYDROcodone-acetaminophen (NORCO) 7.5-325 MG per tablet Take 2 tablets by mouth every 6 (six) hours as needed. For pain relief      . ibuprofen (ADVIL,MOTRIN) 800 MG tablet Take 800 mg by mouth every 8 (eight) hours as needed. For knee pain      . pramoxine (PROCTOFOAM) 1 % foam Place 1 application rectally 2 (two) times daily.  15 g  0   No current facility-administered medications for this visit.   Allergies  Allergen Reactions  . Adhesive [Tape] Other (See Comments)    SEVERE IRRITAION  . Ampicillin Nausea And  Vomiting  . Doxycycline Rash  . Lyrica [Pregabalin] Nausea And Vomiting and Rash   Endoscopic procedures:  Reports to be scanned in EPIC  1. EGD with biopsy July 2005 (Dr. Jennye BoroughsMisenheimer). Findings included the erosive esophagitis and gastritis  2. Flexible sigmoidoscopy July 2005 for diarrhea (Misenheimer) Findings included 2 small sigmoid polyps, exam otherwise normal. Polyp path compatible with adenoma  3. Colonoscopy September 2005. Exam to cecum, good prep. Findings included 3 diminutive colon and rectal polyps and small internal hemorrhoids. Colon pathology compatible with hyperplastic polyps   4. Colonoscopy for abdominal pain and diarrhea April 2007 by Dr. Estrella DeedsGibb. Internal and external hemorrhoids found, exam was normal. Random colon biopsies normal  5. Colonoscopy December 2010 indices surveillance of adenomatous polyps). Exam to cecum, good prep. 2  small polyps removed polyps were hyperplastic.  6. Colonoscopy August 2013 blood in stool and history of colon polyps. Exam to the cecum, prep fair. Findings include small internal hemorrhoids, mild perirectal inflammation, exam otherwise normal.  7. EGD August 2013 for reflux. Findings included a regular GE junction, hiatal hernia. Empirical dilation was done for dysphagia. Distal esophageal biopsies show chronically inflamed GE junction mucosa, no Barrett's esophagus.    Review of Systems: Positive for allergy/ sinus, anxiety, arthritis, back pain, vision changes, fatigue, night sweats, sleeping problems, sore throat, swelling of feet/legs and swollen lymph nodes. All other systems reviewed and negative except where noted in HPI.    Physical Exam: BP 140/76  Pulse 72  Ht 5' 8.5" (1.74 m)  Wt 212 lb 3.2 oz (96.253 kg)  BMI 31.79 kg/m2 Constitutional: Pleasant,well-developed, white male in no acute distress. HEENT: Normocephalic and atraumatic. Conjunctivae are normal. No scleral icterus. Neck supple.  Cardiovascular: Normal rate, regular rhythm.  Pulmonary/chest: Effort normal and breath sounds normal. No wheezing, rales or rhonchi. Abdominal: Soft, nondistended, nontender. Bowel sounds active throughout. There are no masses palpable. No hepatomegaly. Extremities: no edema Lymphadenopathy: No cervical adenopathy noted. Neurological: Alert and oriented to person place and time. Skin: Skin is warm and dry. No rashes noted. Psychiatric: Normal mood and affect. Behavior is normal.   ASSESSMENT AND PLAN:  191. 48 year old male with urgency / rectal discomfort over last few weeks following a fall on buttocks. Stools are formed, no diarrhea. He has IBS, symptoms could be functional. He did have some mild perirectal inflammation on last colonoscopy in 2013 so proctitis is a possibility. Neurology evaluated and mentioned bowel changes could be related to lumbosacral root strain / stretch  injury from fall but recommended GI evaluation. Trial of Protofoam. Will schedule him for a flexible sigmoidoscopy for further evaluation. If proctofoam helps patient will cancel sigmoidoscopy.    2. GERD / erosive esophagitis on EGD 2005.  3. HIV positive, on treatment and [facility].   4 Hx of adenomatous colon polyps 2005), he is up to date on surveillance colonoscopy. No polyps on last colonoscopy August 2013

## 2014-09-17 NOTE — Progress Notes (Signed)
Reviewed and agree with management. Byron Tipping D. Shaasia Odle, M.D., FACG  

## 2014-09-18 ENCOUNTER — Encounter: Payer: Self-pay | Admitting: Gastroenterology

## 2014-09-18 ENCOUNTER — Ambulatory Visit (AMBULATORY_SURGERY_CENTER): Payer: PRIVATE HEALTH INSURANCE | Admitting: Gastroenterology

## 2014-09-18 VITALS — BP 119/77 | HR 62 | Temp 96.3°F | Resp 20 | Ht 68.0 in | Wt 212.0 lb

## 2014-09-18 DIAGNOSIS — K625 Hemorrhage of anus and rectum: Secondary | ICD-10-CM

## 2014-09-18 DIAGNOSIS — K6289 Other specified diseases of anus and rectum: Secondary | ICD-10-CM

## 2014-09-18 DIAGNOSIS — K648 Other hemorrhoids: Secondary | ICD-10-CM

## 2014-09-18 MED ORDER — SODIUM CHLORIDE 0.9 % IV SOLN
500.0000 mL | INTRAVENOUS | Status: DC
Start: 2014-09-18 — End: 2014-09-19

## 2014-09-18 MED ORDER — HYDROCORTISONE ACETATE 25 MG RE SUPP: 25.0000 mg | Freq: Two times a day (BID) | RECTAL | Status: DC

## 2014-09-18 NOTE — Op Note (Signed)
[facility] [address] [place], [id number]   FLEXIBLE SIGMOIDOSCOPY PROCEDURE REPORT  PATIENT: Kevin Pena, Kevin Pena  MR#: [id number] BIRTHDATE: 1966/10/29 , 48  yrs. old GENDER: male ENDOSCOPIST: Louis Meckelobert D Kaplan, MD REFERRED BY: PROCEDURE DATE:  09/18/2014 PROCEDURE:   Sigmoidoscopy with biopsy ASA CLASS:   Class II INDICATIONS:anal bleeding. MEDICATIONS: Monitored anesthesia care and Propofol 150 mg IV  DESCRIPTION OF PROCEDURE:   After the risks benefits and alternatives of the procedure were thoroughly explained, informed consent was obtained.  Digital exam revealed no abnormalities of the rectum. The LB PFC-H190 [id number]  endoscope was introduced through the anus  and advanced to the descending colon , The exam was Without limitations.    The quality of the prep was The overall prep quality was good. .  The instrument was then slowly withdrawn as the mucosa was fully examined.         COLON FINDINGS: Internal hemorrhoids were found.   There is questionable faint erythema in the rectal vault.  Multiple biopsies were taken to rule out proctitis.  The remainder of the colon including sigmoid and descending colon were normal.    Retroflexed views revealed no abnormalities.    The scope was then withdrawn from the patient and the procedure terminated.  COMPLICATIONS: There were no immediate complications.  ENDOSCOPIC IMPRESSION: 1.   Internal hemorrhoids 2.   There is questionable faint erythema in the rectal vault. Multiple biopsies were taken to rule out proctitis.  The remainder of the colon including sigmoid and descending colon were normal  RECOMMENDATIONS: Await biopsy results Anusol HC suppositories office visit 3-4 weeks  REPEAT EXAM:  eSigned:  Louis Meckelobert D Kaplan, MD 09/18/2014 10:35 AM   CC: Jamelle RushingVikram Penumali, MD, Brent BullaLawrence Perry, MD  PATIENT NAME:  Kevin Pena, Kevin Pena MR#: [id number]

## 2014-09-18 NOTE — Patient Instructions (Addendum)

## 2014-09-18 NOTE — Progress Notes (Signed)
Procedure ends, to recovery, report given and VSS. 

## 2014-09-18 NOTE — Progress Notes (Signed)
Called to room to assist during endoscopic procedure.  Patient ID and intended procedure confirmed with present staff. Received instructions for my participation in the procedure from the performing physician.  

## 2014-09-19 ENCOUNTER — Telehealth: Payer: Self-pay | Admitting: *Deleted

## 2014-09-19 NOTE — Telephone Encounter (Signed)
  Follow up Call-  Call back number 09/18/2014  Post procedure Call Back phone  # [phone]  Permission to leave phone message Yes     Patient questions:  Do you have a fever, pain , or abdominal swelling? No. Pain Score  0 *  Have you tolerated food without any problems? Yes.    Have you been able to return to your normal activities? Yes.    Do you have any questions about your discharge instructions: Diet   No. Medications  Yes.   Follow up visit  No.  Do you have questions or concerns about your Care? No.  Actions: * If pain score is 4 or above: No action needed, pain <4.  Pt. Had question about medication and insurance. Pt. Instructed to notify office when they open concerning insurance question,he verbalize understanding.

## 2014-09-22 NOTE — Progress Notes (Signed)
Quick Note:  Please inform the patient that lab : Biopsies were normal and to continue current plan of action. There is no evidence for proctitis. ______

## 2014-10-05 ENCOUNTER — Ambulatory Visit: Payer: PRIVATE HEALTH INSURANCE | Admitting: Gastroenterology

## 2014-10-10 ENCOUNTER — Ambulatory Visit (INDEPENDENT_AMBULATORY_CARE_PROVIDER_SITE_OTHER): Payer: PRIVATE HEALTH INSURANCE | Admitting: Nurse Practitioner

## 2014-10-10 ENCOUNTER — Encounter: Payer: Self-pay | Admitting: Nurse Practitioner

## 2014-10-10 VITALS — BP 132/76 | HR 76 | Ht 69.0 in | Wt 209.2 lb

## 2014-10-10 DIAGNOSIS — R112 Nausea with vomiting, unspecified: Secondary | ICD-10-CM | POA: Insufficient documentation

## 2014-10-10 MED ORDER — ONDANSETRON HCL 4 MG PO TABS: 4.0000 mg | ORAL_TABLET | Freq: Four times a day (QID) | ORAL | Status: DC | PRN

## 2014-10-10 NOTE — Progress Notes (Signed)
Reviewed and agree with management. Robert D. Kaplan, M.D., FACG  

## 2014-10-10 NOTE — Patient Instructions (Signed)
We have sent the following medications to your pharmacy for you to pick up at your convenience:  Zofran  You have been scheduled for an endoscopy. Please follow written instructions given to you at your visit today. If you use inhalers (even only as needed), please bring them with you on the day of your procedure. Your physician has requested that you go to www.startemmi.com and enter the access code given to you at your visit today. This web site gives a general overview about your procedure. However, you should still follow specific instructions given to you by our office regarding your preparation for the procedure.   

## 2014-10-10 NOTE — Progress Notes (Signed)
     History of Present Illness:   Patient is a 48 year old male recently established care here. I saw patient several weeks back for evaluation of rectal bleeding and bowel changes. Patient is HIV positive, on treatment and tells me his viral load if undetectable. He has a history of adenomatous polyps and was followed for years by Dr. Jennye BoroughsMisenheimer in [place]. He had also been seen by Dr. Chales AbrahamsGupta but came here for a second opinion for urgency / ongoing bleeding.   He has had several colonoscopies with findings of hemorrhoids and small adenomatous polyps. Patient fell on his buttocks about one month ago. Since then he has developed problems with frequent urges to defecate and sometimes it so urgent that he has accidents. Stools are formed, no diarrhea. Rectal bleeding, a chronic problem, is random. Post fall a CTscan of lumbar spine revealed mild disc bulging and facet hypertrophy at L3-L4. No fractures seen. Patient was evaluated by Neurology who felt bowel symptoms could be related to lumbosacral root strain / stretch injury but recommended GI evaluation.   Patient underwent a sigmoidoscopy by Dr. Arlyce DiceKaplan. It was faint erythema in the rectum but biopsies were normal. Internal hemorrhoids were found. Patient cannot afford the prescription strength steroid suppositories, he's been using over-the-counter hemorrhoidal preparations. Bleeding has improved overall. He is not having diarrhea.  Patient is here to discuss nausea vomiting which started about 2 months ago. Patient takes chronic narcotics. He has no abdominal pain but complains of right upper quadrant tenderness. Emesis is nonbloody. Nausea worse in the morning. Patient has not started any new medications to account for the nausea. His testosterone level is low, will be starting replacement   Current Medications, Allergies, Past Medical History, Past Surgical History, Family History and Social History were reviewed in [facility] electronic  medical record.   Physical Exam: General: Pleasant, well developed , white male in no acute distress Head: Normocephalic and atraumatic Eyes:  sclerae anicteric, conjunctiva pink  Ears: Normal auditory acuity Lungs: Clear throughout to auscultation Heart: Regular rate and rhythm Abdomen: Soft, non distended, non-tender. No masses, no hepatomegaly. Normal bowel sounds Musculoskeletal: Symmetrical with no gross deformities  Extremities: No edema  Neurological: Alert oriented x 4, grossly nonfocal Psychological:  Alert and cooperative. Normal mood and affect  Assessment and Recommendations:  511. 48 year old male here with 2 month history of nausea vomiting. I saw the patient a few weeks back for rectal bleeding. Patient thought he discussed nausea and vomiting with me but I have no recollection of that nor did I mention it my office note. Patient takes chronic pain medications but the nausea and vomiting only started a couple of months ago.   For further evaluation patient will be scheduled for upper endoscopy. The benefits, risks, and potential complications of EGD with possible biopsies a were discussed with the patient and he agrees to proceed.   I've asked him to hold the NSAIDs until upper endoscopy complete.   Continue daily PPI.   Trial of Zofran.   Patient inquires about whether or not his stomach empties appropriately. We discussed the role of narcotics in gastroparesis. Will first rule out organic disease  2. HIV, followed [facility]. Viral load undetectable on medications per patient.

## 2014-10-13 ENCOUNTER — Ambulatory Visit: Payer: PRIVATE HEALTH INSURANCE | Admitting: Diagnostic Neuroimaging

## 2014-10-17 ENCOUNTER — Encounter: Payer: Self-pay | Admitting: Diagnostic Neuroimaging

## 2014-10-17 ENCOUNTER — Ambulatory Visit (INDEPENDENT_AMBULATORY_CARE_PROVIDER_SITE_OTHER): Payer: PRIVATE HEALTH INSURANCE | Admitting: Diagnostic Neuroimaging

## 2014-10-17 VITALS — BP 138/88 | HR 70 | Ht 69.5 in | Wt 213.0 lb

## 2014-10-17 DIAGNOSIS — R194 Change in bowel habit: Secondary | ICD-10-CM

## 2014-10-17 DIAGNOSIS — M545 Low back pain, unspecified: Secondary | ICD-10-CM

## 2014-10-17 NOTE — Patient Instructions (Signed)
Try physical therapy, massage therapy, water exercises/swimming.

## 2014-10-17 NOTE — Progress Notes (Signed)
[facility]  PATIENT: Kevin Pena DOB: 04/08/1966  REFERRING CLINICIAN: Marina Goodell HISTORY FROM: patient  REASON FOR VISIT: new consult    HISTORICAL  CHIEF COMPLAINT:  Chief Complaint  Patient presents with  . Follow-up    HISTORY OF PRESENT ILLNESS:   UPDATE 10/17/14: Since last visit, saw GI, dx'd with internal hemorrhoids. Doing a little better re: bowel urgency. Now with more nausea. On chronic opioids.  PRIOR HPI (09/01/14): 48 year old right-handed male with HIV, hepatitis B, hypertension, here for evaluation of low back pain, bowel urgency. 08/19/2014 patient was standing on concrete floor, went to sit down but missed the chair. He fell straight down onto his buttocks. Had severe low back pain near his tailbone. Since that time he has had pain in his low back, numbness radiating into the legs, as well as increased urge to have bowel movements. He increased number of bowel movements over last 2 weeks. He had one or 2 bowel movements accidents. No change in bladder symptoms. Patient developed increased urgency for urination, and was treated by urology with bladder stimulator which has significantly improved symptoms. Patient had CT of the lumbar spine on 08/28/14 which showed mild disc bulging and facet hypertrophy at L3-4 with mild spinal stenosis. No evidence of fracture.   REVIEW OF SYSTEMS: Full 14 system review of systems performed and notable only for chills fatigue nausea vomiting rectal pain joint pain back pain.    ALLERGIES: Allergies  Allergen Reactions  . Adhesive [Tape] Other (See Comments)    SEVERE IRRITAION  . Ampicillin Nausea And Vomiting  . Doxycycline Rash  . Lyrica [Pregabalin] Nausea And Vomiting and Rash    HOME MEDICATIONS: Outpatient Prescriptions Prior to Visit  Medication Sig Dispense Refill  . ANDROGEL 25 MG/2.5GM (1%) GEL   4  . cephALEXin (KEFLEX) 500 MG capsule Take 500 mg by mouth 3 (three) times daily.    .  diazepam (VALIUM) 5 MG tablet Take 10 mg by mouth at bedtime. scheduled    . efavirenz-emtrictabine-tenofovir (ATRIPLA) 600-200-300 MG per tablet Take 1 tablet by mouth at bedtime.    Marland Kitchen esomeprazole (NEXIUM) 40 MG capsule Take 40 mg by mouth every evening.     . hydrochlorothiazide (HYDRODIURIL) 25 MG tablet Take 25 mg by mouth daily.    Marland Kitchen HYDROcodone-acetaminophen (NORCO) 7.5-325 MG per tablet Take 2 tablets by mouth every 6 (six) hours as needed. For pain relief    . ibuprofen (ADVIL,MOTRIN) 800 MG tablet Take 800 mg by mouth every 8 (eight) hours as needed. For knee pain    . ondansetron (ZOFRAN) 4 MG tablet Take 1 tablet (4 mg total) by mouth every 6 (six) hours as needed for nausea or vomiting. 30 tablet 0  . Oxycodone HCl 10 MG TABS Take 10 mg by mouth 4 (four) times daily.  0  . PROAIR HFA 108 (90 BASE) MCG/ACT inhaler   11  . QVAR 40 MCG/ACT inhaler   6  . valACYclovir (VALTREX) 500 MG tablet Take 1 tablet by mouth as needed.    Marland Kitchen atorvastatin (LIPITOR) 20 MG tablet Take 20 mg by mouth daily.     No facility-administered medications prior to visit.    PAST MEDICAL HISTORY: Past Medical History  Diagnosis Date  . HIV positive 1986  . Urge urinary incontinence   . Frequency of urination   . GERD (gastroesophageal reflux disease)   . PONV (postoperative nausea and vomiting)   . Arthritis   . Rash left leg--  using topical cream  . Hepatitis B carrier 1986  . Hypertension   . Internal hemorrhoids     PAST SURGICAL HISTORY: Past Surgical History  Procedure Laterality Date  . Foot surgery    . Appendectomy  1981  . Cholecystectomy  2006  . Right inguinal hernia repair  1997  . Repair recurrent right inguinal hernia  2000  . Umbilical hernia repair  2010  . Hammer toe surgery  OCT 2010  . Interstim implant placement  06/17/2012    Procedure: INTERSTIM IMPLANT FIRST STAGE;  Surgeon: Martina SinnerScott A MacDiarmid, MD;  [facility];  Service: Urology;  Laterality:  N/A;  RAD TECH OK PER VICKIE AT MAIN OR   . Interstim implant placement  06/17/2012    Procedure: INTERSTIM IMPLANT SECOND STAGE;  Surgeon: Martina SinnerScott A MacDiarmid, MD;  [facility];  Service: Urology;  Laterality: N/A;    FAMILY HISTORY: Family History  Problem Relation Age of Onset  . Colon cancer Neg Hx   . Pancreatic cancer Neg Hx   . Rectal cancer Neg Hx   . Stomach cancer Neg Hx   . Other Mother     colon resection, tumor near liver  . Heart Problems Father     SOCIAL HISTORY:  History   Social History  . Marital Status: Significant Other    Spouse Name: Lavetta NielsenFrank Martinez    Number of Children: 1  . Years of Education: College   Occupational History  .  Other    disability   Social History Main Topics  . Smoking status: Former Smoker -- 1.00 packs/day for 30 years    Types: Cigarettes    Quit date: 06/11/2010  . Smokeless tobacco: Former NeurosurgeonUser  . Alcohol Use: 0.0 oz/week    0 Not specified per week  . Drug Use: No  . Sexual Activity: Not on file   Other Topics Concern  . Not on file   Social History Narrative   Patient lives at home with partner.   Caffeine Use: 32oz daily coffee/tea     PHYSICAL EXAM  Filed Vitals:   10/17/14 1517  BP: 138/88  Pulse: 70  Height: 5' 9.5" (1.765 m)  Weight: 213 lb (96.616 kg)    Not recorded      Body mass index is 31.01 kg/(m^2).  GENERAL EXAM: Patient is in no distress; well developed, nourished and groomed; neck is supple  CARDIOVASCULAR: Regular rate and rhythm, no murmurs, no carotid bruits  NEUROLOGIC: MENTAL STATUS: awake, alert, oriented to person, place and time, recent and remote memory intact, normal attention and concentration, language fluent, comprehension intact, naming intact, fund of knowledge appropriate CRANIAL NERVE: no papilledema on fundoscopic exam, pupils equal and reactive to light, visual fields full to confrontation, extraocular muscles intact, no nystagmus, facial  sensation and strength symmetric, hearing intact, palate elevates symmetrically, uvula midline, shoulder shrug symmetric, tongue midline. MOTOR: normal bulk and tone, full strength in the BUE and BLE; SOME PAIN WITH LOWER EXT MOVEMENTS.  SENSORY: normal and symmetric to light touch COORDINATION: finger-nose-finger, fine finger movements normal REFLEXES: BUE TRACE, BLE 0. GAIT/STATION: SLIGHTLY ANTALGIC GAIT; ABLE TO WALK ON TOES AND HEELS.    DIAGNOSTIC DATA (LABS, IMAGING, TESTING) - I reviewed patient records, labs, notes, testing and imaging myself where available.  Lab Results  Component Value Date   WBC 10.4 02/09/2012   HGB 15.3 06/17/2012   HCT 45.0 06/17/2012   MCV 89.3 02/09/2012  PLT 207 02/09/2012      Component Value Date/Time   NA 144 06/17/2012 0656   K 3.9 06/17/2012 0656   CL 110 02/09/2012 2359   GLUCOSE 96 06/17/2012 0656   BUN 19 02/09/2012 2359   CREATININE 1.20 02/09/2012 2359   No results found for: CHOL No results found for: HGBA1C No results found for: VITAMINB12 No results found for: TSH  I reviewed images myself and agree with interpretation. -VRP  08/28/14 CT LUMBAR SPINE - No acute abnormality. Negative for fracture. Mild convex right scoliosis, unchanged. Shallow disc bulge and ligamentum flavum thickening at L3-4 cause mild central canal narrowing.   ASSESSMENT AND PLAN  48 y.o. year old male here with increased bowel urgency, ever since fall on 08/19/2014 on this bottom. CT lumbar spine shows no acute findings. However there is mild spinal stenosis at L3-4 related to disc bulging, facet and ligamentum flavum hypertrophy. Bowel symptoms may be related to lumbosacral root strain/stretch injury vs other GI etiology. Overall symptoms stable to slightly improving.    PLAN: - Advised observation for now. Patient cannot have MRI lumbar spine due to bladder stimulator. If symptoms significantly worsen may consider referral to neurosurgery for  consideration of CT myelogram and surgical options. - try PT or massage therapy  Return for return to PCP.    Suanne MarkerVIKRAM R. PENUMALLI, MD 10/17/2014, 4:07 PM Certified in Neurology, Neurophysiology and Neuroimaging  [facility] [address], [address] [place], [place] [address] [phone]

## 2014-11-27 DIAGNOSIS — Z6831 Body mass index (BMI) 31.0-31.9, adult: Secondary | ICD-10-CM | POA: Diagnosis not present

## 2014-11-27 DIAGNOSIS — L03115 Cellulitis of right lower limb: Secondary | ICD-10-CM | POA: Diagnosis not present

## 2014-12-11 DIAGNOSIS — L03115 Cellulitis of right lower limb: Secondary | ICD-10-CM | POA: Diagnosis not present

## 2014-12-11 DIAGNOSIS — Z6841 Body Mass Index (BMI) 40.0 and over, adult: Secondary | ICD-10-CM | POA: Diagnosis not present

## 2014-12-12 ENCOUNTER — Ambulatory Visit (AMBULATORY_SURGERY_CENTER): Payer: Medicare Other | Admitting: Gastroenterology

## 2014-12-12 ENCOUNTER — Encounter: Payer: Self-pay | Admitting: Gastroenterology

## 2014-12-12 VITALS — BP 115/75 | HR 63 | Temp 96.8°F | Resp 19 | Ht 69.0 in | Wt 209.0 lb

## 2014-12-12 DIAGNOSIS — I10 Essential (primary) hypertension: Secondary | ICD-10-CM | POA: Diagnosis not present

## 2014-12-12 DIAGNOSIS — R112 Nausea with vomiting, unspecified: Secondary | ICD-10-CM | POA: Diagnosis not present

## 2014-12-12 DIAGNOSIS — R194 Change in bowel habit: Secondary | ICD-10-CM | POA: Diagnosis not present

## 2014-12-12 MED ORDER — SODIUM CHLORIDE 0.9 % IV SOLN: 500.0000 mL | INTRAVENOUS | Status: DC

## 2014-12-12 NOTE — Op Note (Signed)
[facility] [address] [place], [id number]   ENDOSCOPY PROCEDURE REPORT  PATIENT: Kevin Pena, Kevin Pena  MR#: [id number] BIRTHDATE: 09/14/1966 , 48  yrs. old GENDER: male ENDOSCOPIST: Louis Meckelobert D Nylen Creque, MD REFERRED BY:  Brent BullaLawrence Perry, M.D. PROCEDURE DATE:  12/12/2014 PROCEDURE:  EGD, diagnostic ASA CLASS:     Class II INDICATIONS:  nausea and vomiting. MEDICATIONS: Monitored anesthesia care and Propofol 200 mg IV TOPICAL ANESTHETIC:  DESCRIPTION OF PROCEDURE: After the risks benefits and alternatives of the procedure were thoroughly explained, informed consent was obtained.  The LB [id number] endoscope was introduced through the mouth and advanced to the third portion of the duodenum , Without limitations.  The instrument was slowly withdrawn as the mucosa was fully examined.    Normal EGD  Retroflexed views revealed no abnormalities.     The scope was then withdrawn from the patient and the procedure completed.  COMPLICATIONS: There were no immediate complications.  ENDOSCOPIC IMPRESSION: Normal appearing esophagus and GE junction, the stomach was well visualized and normal in appearance, normal appearing duodenum  RECOMMENDATIONS: 1.  Begin feeding tomorrow 2.  My office will arrange for you to have a Gastric Emptying Scan performed.  This is a radiology test that gives an idea of how well your stomach functions.  REPEAT EXAM:  eSigned:  Louis Meckelobert D Jonavin Seder, MD 12/12/2014 3:33 PM    CC:

## 2014-12-12 NOTE — Patient Instructions (Signed)
YOU HAD AN ENDOSCOPIC PROCEDURE TODAY AT THE [facility]: Refer to the procedure report that was given to you for any specific questions about what was found during the examination.  If the procedure report does not answer your questions, please call your gastroenterologist to clarify.  If you requested that your care partner not be given the details of your procedure findings, then the procedure report has been included in a sealed envelope for you to review at your convenience later.  YOU SHOULD EXPECT: Some feelings of bloating in the abdomen. Passage of more gas than usual.  Walking can help get rid of the air that was put into your GI tract during the procedure and reduce the bloating. If you had a lower endoscopy (such as a colonoscopy or flexible sigmoidoscopy) you may notice spotting of blood in your stool or on the toilet paper. If you underwent a bowel prep for your procedure, then you may not have a normal bowel movement for a few days.  DIET: Your first meal following the procedure should be a light meal and then it is ok to progress to your normal diet.  A half-sandwich or bowl of soup is an example of a good first meal.  Heavy or fried foods are harder to digest and may make you feel nauseous or bloated.  Likewise meals heavy in dairy and vegetables can cause extra gas to form and this can also increase the bloating.  Drink plenty of fluids but you should avoid alcoholic beverages for 24 hours.  ACTIVITY: Your care partner should take you home directly after the procedure.  You should plan to take it easy, moving slowly for the rest of the day.  You can resume normal activity the day after the procedure however you should NOT DRIVE or use heavy machinery for 24 hours (because of the sedation medicines used during the test).    SYMPTOMS TO REPORT IMMEDIATELY: A gastroenterologist can be reached at any hour.  During normal business hours, 8:30 AM to 5:00 PM Monday through Friday,  call [phone].  After hours and on weekends, please call the GI answering service at [phone] who will take a message and have the physician on call contact you.  Following upper endoscopy (EGD)  Vomiting of blood or coffee ground material  New chest pain or pain under the shoulder blades  Painful or persistently difficult swallowing  New shortness of breath  Fever of 100F or higher  Black, tarry-looking stools  FOLLOW UP: If any biopsies were taken you will be contacted by phone or by letter within the next 1-3 weeks.  Call your gastroenterologist if you have not heard about the biopsies in 3 weeks.  Our staff will call the home number listed on your records the next business day following your procedure to check on you and address any questions or concerns that you may have at that time regarding the information given to you following your procedure. This is a courtesy call and so if there is no answer at the home number and we have not heard from you through the emergency physician on call, we will assume that you have returned to your regular daily activities without incident.  SIGNATURES/CONFIDENTIALITY: You and/or your care partner have signed paperwork which will be entered into your electronic medical record.  These signatures attest to the fact that that the information above on your After Visit Summary has been reviewed and is understood.  Full responsibility of   the confidentiality of this discharge information lies with you and/or your care-partner. 

## 2014-12-12 NOTE — Progress Notes (Signed)
Report to PACU, RN, vss, BBS= Clear.  

## 2014-12-13 ENCOUNTER — Other Ambulatory Visit: Payer: Self-pay

## 2014-12-13 ENCOUNTER — Telehealth: Payer: Self-pay

## 2014-12-13 ENCOUNTER — Telehealth: Payer: Self-pay | Admitting: *Deleted

## 2014-12-13 DIAGNOSIS — R1110 Vomiting, unspecified: Secondary | ICD-10-CM

## 2014-12-13 NOTE — Telephone Encounter (Signed)
Patient aware.

## 2014-12-13 NOTE — Telephone Encounter (Signed)
  Follow up Call-  Call back number 12/12/2014 09/18/2014  Post procedure Call Back phone  # [phone] or [phone]  Permission to leave phone message Yes Yes     Patient questions:  Do you have a fever, pain , or abdominal swelling? No. Pain Score  0 *  Have you tolerated food without any problems? Yes.    Have you been able to return to your normal activities? Yes.    Do you have any questions about your discharge instructions: Diet   No. Medications  No. Follow up visit  No.  Do you have questions or concerns about your Care? No.  Actions: * If pain score is 4 or above: No action needed, pain <4.  Pt. St states he had a few streaks of blood in stool.  He also states he had some blood on toliet paper.  States he has hemorrhoids that could be bleeding.  Advised to call office if bleeding increases, and If he notes black tarry stools.

## 2014-12-13 NOTE — Telephone Encounter (Signed)
I have left message for the patient to call back. Need GES. Scheduled for 12/27/14 arrive at 7:15. NPO for 8 hours prior. Do not take stomach medications the day before or the day of the test.

## 2014-12-25 ENCOUNTER — Ambulatory Visit (HOSPITAL_COMMUNITY)
Admission: RE | Admit: 2014-12-25 | Discharge: 2014-12-25 | Disposition: A | Discharge status: Home / Self Care | Payer: Medicare Other | Source: Ambulatory Visit | Attending: Gastroenterology | Admitting: Gastroenterology

## 2014-12-25 DIAGNOSIS — R1110 Vomiting, unspecified: Secondary | ICD-10-CM

## 2014-12-25 DIAGNOSIS — K219 Gastro-esophageal reflux disease without esophagitis: Secondary | ICD-10-CM | POA: Insufficient documentation

## 2014-12-25 DIAGNOSIS — R109 Unspecified abdominal pain: Secondary | ICD-10-CM | POA: Diagnosis not present

## 2014-12-25 MED ORDER — TECHNETIUM TC 99M SULFUR COLLOID
2.2000 | Freq: Once | INTRAVENOUS | Status: AC
  Administered 2014-12-25: 2.2 via INTRAVENOUS

## 2015-01-09 ENCOUNTER — Encounter: Payer: Self-pay | Admitting: Gastroenterology

## 2015-01-09 ENCOUNTER — Ambulatory Visit (INDEPENDENT_AMBULATORY_CARE_PROVIDER_SITE_OTHER): Payer: Medicare Other | Admitting: Gastroenterology

## 2015-01-09 VITALS — BP 92/66 | HR 68 | Ht 69.0 in | Wt 213.0 lb

## 2015-01-09 DIAGNOSIS — Z1212 Encounter for screening for malignant neoplasm of rectum: Secondary | ICD-10-CM

## 2015-01-09 DIAGNOSIS — R1110 Vomiting, unspecified: Secondary | ICD-10-CM

## 2015-01-09 DIAGNOSIS — Z1211 Encounter for screening for malignant neoplasm of colon: Secondary | ICD-10-CM | POA: Diagnosis not present

## 2015-01-09 MED ORDER — TRAMADOL HCL 50 MG PO TABS: 50.0000 mg | ORAL_TABLET | Freq: Four times a day (QID) | ORAL | Status: DC | PRN

## 2015-01-09 MED ORDER — NA SULFATE-K SULFATE-MG SULF 17.5-3.13-1.6 GM/177ML PO SOLN
1.0000 | Freq: Once | ORAL | Status: DC
Start: 2015-01-09 — End: 2015-02-07

## 2015-01-09 NOTE — Assessment & Plan Note (Signed)
Patient does not think he has had full colonoscopy and there are no records to indicate that this has been done.  I recommended screening colonoscopy at some point this year

## 2015-01-09 NOTE — Progress Notes (Signed)
      History of Present Illness:  Mr. Kevin Pena itchiness to complain of nausea.  Nausea may occur anytime during the day and is not related to eating.  Upper endoscopy and gastric empty scan were unremarkable.  He continues to take oxycodone every 6 hours for chronic pain.    Review of Systems: Pertinent positive and negative review of systems were noted in the above HPI section. All other review of systems were otherwise negative.    Current Medications, Allergies, Past Medical History, Past Surgical History, Family History and Social History were reviewed in [facility] electronic medical record  Vital signs were reviewed in today's medical record. Physical Exam: General: Well developed , well nourished, no acute distress   See Assessment and Plan under Problem List

## 2015-01-09 NOTE — Patient Instructions (Addendum)
We are giving you a prescription of Tramadol today Call back in one week to let us know how you are doing You have been scheduled for a colonoscopy. Please follow written instructions given to you at your visit today.  Please pick up your prep kit at the pharmacy within the next 1-3 days. If you use inhalers (even only as needed), please bring them with you on the day of your procedure. Your physician has requested that you go to www.startemmi.com and enter the access code given to you at your visit today. This web site gives a general overview about your procedure. However, you should still follow specific instructions given to you by our office regarding your preparation for the procedure.

## 2015-01-09 NOTE — Assessment & Plan Note (Signed)
Chronic nausea is most likely medicine-related, specifically to oxycodone.  Recommendations #1 trial of tramadol for pain in lieu of Roxicodone

## 2015-01-09 NOTE — Addendum Note (Signed)
Addended by: Marlowe KaysSTALLINGS, Welcome Fults M on: 01/09/2015 11:01 AM   Modules accepted: Orders

## 2015-01-25 ENCOUNTER — Telehealth: Payer: Self-pay | Admitting: Gastroenterology

## 2015-01-26 NOTE — Telephone Encounter (Signed)
States he has no real improvement in his nausea, but it is not worse (no new sx's). He will awaken during his sleep feeling nauseous. His nausea is almost always mornings. Less often in the evenings. He is taking the Zofran as ordered. He has his first appointment with pain management this month. He is also scheduled for a colonoscopy. Normal GES and EGD.

## 2015-01-26 NOTE — Telephone Encounter (Signed)
Switch him from Nexium to Zegerid 40 mg daily at bedtime. Instruct him to try an acid as needed for nausea. Call back 1-2 weeks to report progress

## 2015-01-31 ENCOUNTER — Other Ambulatory Visit: Payer: Self-pay

## 2015-01-31 MED ORDER — OMEPRAZOLE-SODIUM BICARBONATE 40-1100 MG PO CAPS
1.0000 | ORAL_CAPSULE | Freq: Every day | ORAL | Status: DC
Start: 2015-01-31 — End: 2020-03-14

## 2015-01-31 NOTE — Telephone Encounter (Signed)
Patient instructed. He does mention that he wants to resume Vicodin. Stated to the patient that resuming this medication or the Percocet would be counter productive to trying to stop his nausea. Patient states he will wait and discuss options with pain management.

## 2015-02-05 DIAGNOSIS — G894 Chronic pain syndrome: Secondary | ICD-10-CM | POA: Diagnosis not present

## 2015-02-07 ENCOUNTER — Encounter: Payer: Self-pay | Admitting: Gastroenterology

## 2015-02-07 ENCOUNTER — Ambulatory Visit (AMBULATORY_SURGERY_CENTER): Payer: Medicare Other | Admitting: Gastroenterology

## 2015-02-07 VITALS — BP 155/107 | HR 87 | Temp 97.7°F | Resp 17 | Ht 69.0 in | Wt 213.0 lb

## 2015-02-07 DIAGNOSIS — F419 Anxiety disorder, unspecified: Secondary | ICD-10-CM | POA: Diagnosis not present

## 2015-02-07 DIAGNOSIS — Z1211 Encounter for screening for malignant neoplasm of colon: Secondary | ICD-10-CM

## 2015-02-07 DIAGNOSIS — I10 Essential (primary) hypertension: Secondary | ICD-10-CM | POA: Diagnosis not present

## 2015-02-07 DIAGNOSIS — G8929 Other chronic pain: Secondary | ICD-10-CM | POA: Diagnosis not present

## 2015-02-07 MED ORDER — SODIUM CHLORIDE 0.9 % IV SOLN: 500.0000 mL | INTRAVENOUS | Status: DC

## 2015-02-07 NOTE — Op Note (Signed)
[facility] [address] [place], [id number]   COLONOSCOPY PROCEDURE REPORT  PATIENT: Kevin Pena, Kevin Pena  MR#: [id number] BIRTHDATE: [birth year] , 48  yrs. old GENDER: male ENDOSCOPIST: Louis Meckelobert D Vashti Bolanos, MD REFERRED NF:[id number]:Lawrence Marina GoodellPerry, M.D. PROCEDURE DATE:  02/07/2015 PROCEDURE:   Colonoscopy, screening First Screening Colonoscopy - Avg.  risk and is 50 yrs.  old or older Yes.  Prior Negative Screening - Now for repeat screening. N/A  History of Adenoma - Now for follow-up colonoscopy & has been > or = to 3 yrs.  N/A ASA CLASS:   Class II INDICATIONS:Colorectal Neoplasm Risk Assessment for this procedure is average risk. MEDICATIONS: Monitored anesthesia care and Propofol 250 mg IV  DESCRIPTION OF PROCEDURE:   After the risks benefits and alternatives of the procedure were thoroughly explained, informed consent was obtained.  The digital rectal exam revealed no abnormalities of the rectum.   The LB [id number]  endoscope was introduced through the anus and advanced to the ileum. No adverse events experienced.   The quality of the prep  (Suprep was used) excellent.  The instrument was then slowly withdrawn as the colon was fully examined.      COLON FINDINGS: A normal appearing cecum, ileocecal valve, and appendiceal orifice were identified.  the ascending, transverse, descending, sigmoid colon, and rectum appeared unremarkable. Retroflexed views revealed no abnormalities. The time to cecum = 1.7 Withdrawal time = 6.7   The scope was withdrawn and the procedure completed. COMPLICATIONS: There were no complications.  ENDOSCOPIC IMPRESSION: Normal colonoscopy  RECOMMENDATIONS: Continue current colorectal screening recommendations for "routine risk" patients with a repeat colonoscopy in 10 years.  eSigned:  Louis Meckelobert D Jersey Espinoza, MD 02/07/2015 3:52 PM   cc:

## 2015-02-07 NOTE — Patient Instructions (Signed)
YOU HAD AN ENDOSCOPIC PROCEDURE TODAY AT THE [facility]:   Refer to the procedure report that was given to you for any specific questions about what was found during the examination.  If the procedure report does not answer your questions, please call your gastroenterologist to clarify.  If you requested that your care partner not be given the details of your procedure findings, then the procedure report has been included in a sealed envelope for you to review at your convenience later.  YOU SHOULD EXPECT: Some feelings of bloating in the abdomen. Passage of more gas than usual.  Walking can help get rid of the air that was put into your GI tract during the procedure and reduce the bloating. If you had a lower endoscopy (such as a colonoscopy or flexible sigmoidoscopy) you may notice spotting of blood in your stool or on the toilet paper. If you underwent a bowel prep for your procedure, you may not have a normal bowel movement for a few days.  Please Note:  You might notice some irritation and congestion in your nose or some drainage.  This is from the oxygen used during your procedure.  There is no need for concern and it should clear up in a day or so.  SYMPTOMS TO REPORT IMMEDIATELY:   Following lower endoscopy (colonoscopy or flexible sigmoidoscopy):  Excessive amounts of blood in the stool  Significant tenderness or worsening of abdominal pains  Swelling of the abdomen that is new, acute  Fever of 100F or higher   For urgent or emergent issues, a gastroenterologist can be reached at any hour by calling [phone].   DIET: Your first meal following the procedure should be a small meal and then it is ok to progress to your normal diet. Heavy or fried foods are harder to digest and may make you feel nauseous or bloated.  Likewise, meals heavy in dairy and vegetables can increase bloating.  Drink plenty of fluids but you should avoid alcoholic beverages for 24  hours.  ACTIVITY:  You should plan to take it easy for the rest of today and you should NOT DRIVE or use heavy machinery until tomorrow (because of the sedation medicines used during the test).    FOLLOW UP: Our staff will call the number listed on your records the next business day following your procedure to check on you and address any questions or concerns that you may have regarding the information given to you following your procedure. If we do not reach you, we will leave a message.  However, if you are feeling well and you are not experiencing any problems, there is no need to return our call.  We will assume that you have returned to your regular daily activities without incident.  If any biopsies were taken you will be contacted by phone or by letter within the next 1-3 weeks.  Please call us at [phone] if you have not heard about the biopsies in 3 weeks.    SIGNATURES/CONFIDENTIALITY: You and/or your care partner have signed paperwork which will be entered into your electronic medical record.  These signatures attest to the fact that that the information above on your After Visit Summary has been reviewed and is understood.  Full responsibility of the confidentiality of this discharge information lies with you and/or your care-partner.  Recommendations Discharge instructions given to patient and/or care partner. Next colonoscopy in 10 years. 

## 2015-02-08 ENCOUNTER — Telehealth: Payer: Self-pay | Admitting: *Deleted

## 2015-02-08 NOTE — Telephone Encounter (Signed)
No answer, message left for the patient. 

## 2015-02-12 DIAGNOSIS — F411 Generalized anxiety disorder: Secondary | ICD-10-CM | POA: Diagnosis not present

## 2015-02-12 DIAGNOSIS — E782 Mixed hyperlipidemia: Secondary | ICD-10-CM | POA: Diagnosis not present

## 2015-02-12 DIAGNOSIS — Z21 Asymptomatic human immunodeficiency virus [HIV] infection status: Secondary | ICD-10-CM | POA: Diagnosis not present

## 2015-02-13 DIAGNOSIS — E785 Hyperlipidemia, unspecified: Secondary | ICD-10-CM | POA: Diagnosis not present

## 2015-02-13 DIAGNOSIS — I739 Peripheral vascular disease, unspecified: Secondary | ICD-10-CM | POA: Diagnosis not present

## 2015-02-13 DIAGNOSIS — M519 Unspecified thoracic, thoracolumbar and lumbosacral intervertebral disc disorder: Secondary | ICD-10-CM | POA: Diagnosis not present

## 2015-02-13 DIAGNOSIS — G90529 Complex regional pain syndrome I of unspecified lower limb: Secondary | ICD-10-CM | POA: Diagnosis not present

## 2015-02-13 DIAGNOSIS — I10 Essential (primary) hypertension: Secondary | ICD-10-CM | POA: Diagnosis not present

## 2015-02-19 ENCOUNTER — Other Ambulatory Visit: Payer: Self-pay | Admitting: *Deleted

## 2015-02-19 DIAGNOSIS — M25561 Pain in right knee: Secondary | ICD-10-CM | POA: Diagnosis not present

## 2015-02-19 DIAGNOSIS — R202 Paresthesia of skin: Secondary | ICD-10-CM

## 2015-02-19 DIAGNOSIS — M79604 Pain in right leg: Secondary | ICD-10-CM

## 2015-02-19 DIAGNOSIS — G8929 Other chronic pain: Secondary | ICD-10-CM | POA: Diagnosis not present

## 2015-02-19 DIAGNOSIS — M545 Low back pain: Secondary | ICD-10-CM | POA: Diagnosis not present

## 2015-02-19 DIAGNOSIS — M79605 Pain in left leg: Secondary | ICD-10-CM | POA: Diagnosis not present

## 2015-02-21 DIAGNOSIS — F1914 Other psychoactive substance abuse with psychoactive substance-induced mood disorder: Secondary | ICD-10-CM | POA: Diagnosis not present

## 2015-02-21 DIAGNOSIS — Z5181 Encounter for therapeutic drug level monitoring: Secondary | ICD-10-CM | POA: Diagnosis not present

## 2015-02-21 DIAGNOSIS — F1190 Opioid use, unspecified, uncomplicated: Secondary | ICD-10-CM | POA: Diagnosis not present

## 2015-02-26 DIAGNOSIS — M5116 Intervertebral disc disorders with radiculopathy, lumbar region: Secondary | ICD-10-CM | POA: Diagnosis not present

## 2015-02-26 DIAGNOSIS — M6281 Muscle weakness (generalized): Secondary | ICD-10-CM | POA: Diagnosis not present

## 2015-02-26 DIAGNOSIS — S335XXD Sprain of ligaments of lumbar spine, subsequent encounter: Secondary | ICD-10-CM | POA: Diagnosis not present

## 2015-02-26 DIAGNOSIS — G90529 Complex regional pain syndrome I of unspecified lower limb: Secondary | ICD-10-CM | POA: Diagnosis not present

## 2015-02-26 DIAGNOSIS — G8929 Other chronic pain: Secondary | ICD-10-CM | POA: Diagnosis not present

## 2015-02-27 DIAGNOSIS — E291 Testicular hypofunction: Secondary | ICD-10-CM | POA: Diagnosis not present

## 2015-02-27 DIAGNOSIS — Z0000 Encounter for general adult medical examination without abnormal findings: Secondary | ICD-10-CM | POA: Diagnosis not present

## 2015-03-01 DIAGNOSIS — S335XXD Sprain of ligaments of lumbar spine, subsequent encounter: Secondary | ICD-10-CM | POA: Diagnosis not present

## 2015-03-01 DIAGNOSIS — M6281 Muscle weakness (generalized): Secondary | ICD-10-CM | POA: Diagnosis not present

## 2015-03-05 DIAGNOSIS — S335XXD Sprain of ligaments of lumbar spine, subsequent encounter: Secondary | ICD-10-CM | POA: Diagnosis not present

## 2015-03-05 DIAGNOSIS — M6281 Muscle weakness (generalized): Secondary | ICD-10-CM | POA: Diagnosis not present

## 2015-03-08 DIAGNOSIS — M6281 Muscle weakness (generalized): Secondary | ICD-10-CM | POA: Diagnosis not present

## 2015-03-08 DIAGNOSIS — S335XXD Sprain of ligaments of lumbar spine, subsequent encounter: Secondary | ICD-10-CM | POA: Diagnosis not present

## 2015-03-15 DIAGNOSIS — S335XXD Sprain of ligaments of lumbar spine, subsequent encounter: Secondary | ICD-10-CM | POA: Diagnosis not present

## 2015-03-15 DIAGNOSIS — M6281 Muscle weakness (generalized): Secondary | ICD-10-CM | POA: Diagnosis not present

## 2015-03-19 ENCOUNTER — Encounter: Payer: Self-pay | Admitting: Vascular Surgery

## 2015-03-19 DIAGNOSIS — S335XXD Sprain of ligaments of lumbar spine, subsequent encounter: Secondary | ICD-10-CM | POA: Diagnosis not present

## 2015-03-19 DIAGNOSIS — M6281 Muscle weakness (generalized): Secondary | ICD-10-CM | POA: Diagnosis not present

## 2015-03-20 ENCOUNTER — Ambulatory Visit (HOSPITAL_COMMUNITY)
Admission: RE | Admit: 2015-03-20 | Discharge: 2015-03-20 | Disposition: A | Discharge status: Home / Self Care | Payer: Medicare Other | Source: Ambulatory Visit | Attending: Vascular Surgery | Admitting: Vascular Surgery

## 2015-03-20 ENCOUNTER — Encounter: Payer: Self-pay | Admitting: Vascular Surgery

## 2015-03-20 ENCOUNTER — Ambulatory Visit (INDEPENDENT_AMBULATORY_CARE_PROVIDER_SITE_OTHER): Payer: Medicare Other | Admitting: Vascular Surgery

## 2015-03-20 VITALS — BP 138/81 | HR 75 | Resp 18 | Ht 69.0 in | Wt 213.0 lb

## 2015-03-20 DIAGNOSIS — M25561 Pain in right knee: Secondary | ICD-10-CM

## 2015-03-20 DIAGNOSIS — R202 Paresthesia of skin: Secondary | ICD-10-CM | POA: Diagnosis not present

## 2015-03-20 DIAGNOSIS — M79604 Pain in right leg: Secondary | ICD-10-CM | POA: Diagnosis not present

## 2015-03-20 NOTE — Progress Notes (Signed)
Patient name: Kevin Pena MRN: [id number] DOB: 09-27-1966 Sex: male   Referred by: Marina Goodell  Reason for referral:  Chief Complaint  Patient presents with  . New Evaluation    right leg pain  and swelling    HISTORY OF PRESENT ILLNESS: Patient is today for discussion of right leg discomfort. He is here today with his father. 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 Has a history of degenerative disc disease. Has had multiple prior venous duplexes which had been reportedly negative. I do not have these. Also has history of cellulitis in his right leg. He does report a chronic pain and cellulitis occasionally swelling as well. He has tried compression garments in the past but has difficulty wearing these. No history of lower extremity tissue loss. Denies cardiac disease. Interestingly he has been HIV positive for 30 years.  Past Medical History  Diagnosis Date  . HIV positive 1986  . Urge urinary incontinence   . Frequency of urination   . GERD (gastroesophageal reflux disease)   . PONV (postoperative nausea and vomiting)   . Arthritis   . Rash left leg-- using topical cream  . Hepatitis B carrier 1986  . Hypertension   . Internal hemorrhoids   . Hyperlipidemia     Past Surgical History  Procedure Laterality Date  . Foot surgery    . Appendectomy  1981  . Cholecystectomy  2006  . Right inguinal hernia repair  1997  . Repair recurrent right inguinal hernia  2000  . Umbilical hernia repair  2010  . Hammer toe surgery  OCT 2010  . Interstim implant placement  06/17/2012    Procedure: INTERSTIM IMPLANT FIRST STAGE;  Surgeon: Martina Sinner, MD;  Location: [facility];  Service: Urology;  Laterality: N/A;  RAD TECH OK PER VICKIE AT MAIN OR   . Interstim implant placement  06/17/2012    Procedure: INTERSTIM IMPLANT SECOND STAGE;  Surgeon: Martina Sinner, MD;  Location: [facility];   Service: Urology;  Laterality: N/A;    History   Social History  . Marital Status: Significant Other    Spouse Name: [name]  . Number of Children: 1  . Years of Education: College   Occupational History  .  Other    disability   Social History Main Topics  . Smoking status: Former Smoker -- 1.00 packs/day for 30 years    Types: Cigarettes    Quit date: 06/11/2010  . Smokeless tobacco: Former Neurosurgeon  . Alcohol Use: 0.0 oz/week    0 Standard drinks or equivalent per week     Comment: rarely  . Drug Use: No  . Sexual Activity: Not on file   Other Topics Concern  . Not on file   Social History Narrative   Patient lives at home with partner.   Caffeine Use: 32oz daily coffee/tea    Family History  Problem Relation Age of Onset  . Pancreatic cancer Neg Hx   . Rectal cancer Neg Hx   . Stomach cancer Neg Hx   . Colon cancer Neg Hx   . Other Mother     colon resection, tumor near liver  . Diabetes Mother   . Hyperlipidemia Mother   . Hypertension Mother   . Varicose Veins Mother   . Heart Problems Father   . Diabetes Father   . Heart disease Father   .  Hyperlipidemia Father   . Hypertension Father     Allergies as of 03/20/2015 - Review Complete 03/20/2015  Allergen Reaction Noted  . Adhesive [tape] Other (See Comments) 06/11/2012  . Ampicillin Nausea And Vomiting 06/11/2012  . Doxycycline Rash 02/09/2012  . Lyrica [pregabalin] Nausea And Vomiting and Rash 02/09/2012    Current Outpatient Prescriptions on File Prior to Visit  Medication Sig Dispense Refill  . ANDRODERM 4 MG/24HR PT24 patch   4  . atorvastatin (LIPITOR) 20 MG tablet Take 1 tablet by mouth daily.  2  . diazepam (VALIUM) 5 MG tablet Take 10 mg by mouth at bedtime. scheduled    . efavirenz-emtrictabine-tenofovir (ATRIPLA) 600-200-300 MG per tablet Take 1 tablet by mouth at bedtime.    . hydrochlorothiazide (HYDRODIURIL) 25 MG tablet Take 25 mg by mouth daily.    Marland Kitchen omeprazole-sodium  bicarbonate (ZEGERID) 40-1100 MG per capsule Take 1 capsule by mouth daily before breakfast. 90 capsule 3  . ondansetron (ZOFRAN) 4 MG tablet Take 1 tablet (4 mg total) by mouth every 6 (six) hours as needed for nausea or vomiting. 30 tablet 0  . PROAIR HFA 108 (90 BASE) MCG/ACT inhaler   11  . QVAR 40 MCG/ACT inhaler   6  . valACYclovir (VALTREX) 500 MG tablet Take 1 tablet by mouth as needed.    Marland Kitchen HYDROcodone-acetaminophen (NORCO) 7.5-325 MG per tablet Take 2 tablets by mouth every 6 (six) hours as needed. For pain relief    . ibuprofen (ADVIL,MOTRIN) 800 MG tablet Take 800 mg by mouth every 8 (eight) hours as needed. For knee pain     No current facility-administered medications on file prior to visit.     REVIEW OF SYSTEMS:  Positives indicated with an "X"  CARDIOVASCULAR:   chest pain    chest pressure    palpitations    orthopnea   [x ] dyspnea on exertion    claudication    rest pain    DVT    phlebitis PULMONARY:    productive cough    asthma    wheezing NEUROLOGIC:   [x ] weakness  [x ] paresthesias   aphasia   amaurosis  [x ] dizziness HEMATOLOGIC:    bleeding problems    clotting disorders MUSCULOSKELETAL:   joint pain    joint swelling GASTROINTESTINAL: [ x]  blood in stool    hematemesis GENITOURINARY:    dysuria    hematuria PSYCHIATRIC:   history of major depression INTEGUMENTARY:   rashes   ulcers CONSTITUTIONAL:   fever    chills  PHYSICAL EXAMINATION:  General: The patient is a well-nourished male, in no acute distress. Vital signs are BP 138/81 mmHg  Pulse 75  Resp 18  Ht  (1.753 m)  Wt 213 lb (96.616 kg)  BMI 31.44 kg/m2 Pulmonary: There is a good air exchange bilaterally without wheezing or rales. Abdomen: Soft and non-tender no masses noted Musculoskeletal: There are no major deformities.  There is no significant extremity pain. Neurologic: No focal weakness or paresthesias  are detected, Skin: There are no ulcer or rashes noted. No open venous ulcers. Does have some swelling in his right leg versus left leg with some mild diffuse erythema from his knee distally on the right Psychiatric: The patient has normal affect. Cardiovascular: There is a regular rate and rhythm without significant  murmur appreciated. 2+ radial and 2+ femoral and 2+ dorsalis pedis pulses bilaterally  VVS Vascular Lab Studies:  Ordered and Independently Reviewed . I discussed this with the patient and his father present. He underwent the duplex imaging of his arteries the right leg. This reveals normal triphasic waveforms from the level of his common femoral artery through his tibial vessels.  I imaged his right leg superficial veins with SonoSite ultrasound this shows no enlargement of saphenous vein  Impression and Plan:  Had long discussion with patient. Explained that his ankle arm index of 1.4 somewhat unusual. Explained anything above 1.0 is considered normal. I am unable to explain why the arterial branches in his legs were significantly higher than his arms. I explained that he has normal triphasic waveforms throughout the which essentially rules out any arterial insufficiency. I'm clear as to the cause of his leg swelling and discomfort. Do not see any evidence of arterial or venous pathology. Was reassured this discussion will see us again on as-needed basis    Emett Stapel [facility] [place] Office: [phone]

## 2015-03-23 DIAGNOSIS — M6281 Muscle weakness (generalized): Secondary | ICD-10-CM | POA: Diagnosis not present

## 2015-03-23 DIAGNOSIS — S335XXD Sprain of ligaments of lumbar spine, subsequent encounter: Secondary | ICD-10-CM | POA: Diagnosis not present

## 2015-03-26 DIAGNOSIS — S335XXD Sprain of ligaments of lumbar spine, subsequent encounter: Secondary | ICD-10-CM | POA: Diagnosis not present

## 2015-03-26 DIAGNOSIS — M6281 Muscle weakness (generalized): Secondary | ICD-10-CM | POA: Diagnosis not present

## 2015-04-04 DIAGNOSIS — S335XXD Sprain of ligaments of lumbar spine, subsequent encounter: Secondary | ICD-10-CM | POA: Diagnosis not present

## 2015-04-04 DIAGNOSIS — M6281 Muscle weakness (generalized): Secondary | ICD-10-CM | POA: Diagnosis not present

## 2015-04-06 DIAGNOSIS — M6281 Muscle weakness (generalized): Secondary | ICD-10-CM | POA: Diagnosis not present

## 2015-04-06 DIAGNOSIS — S335XXD Sprain of ligaments of lumbar spine, subsequent encounter: Secondary | ICD-10-CM | POA: Diagnosis not present

## 2015-04-16 DIAGNOSIS — L02419 Cutaneous abscess of limb, unspecified: Secondary | ICD-10-CM | POA: Diagnosis not present

## 2015-04-16 DIAGNOSIS — Z6831 Body mass index (BMI) 31.0-31.9, adult: Secondary | ICD-10-CM | POA: Diagnosis not present

## 2015-04-24 DIAGNOSIS — Z6831 Body mass index (BMI) 31.0-31.9, adult: Secondary | ICD-10-CM | POA: Diagnosis not present

## 2015-04-24 DIAGNOSIS — L02419 Cutaneous abscess of limb, unspecified: Secondary | ICD-10-CM | POA: Diagnosis not present

## 2015-04-24 DIAGNOSIS — G90529 Complex regional pain syndrome I of unspecified lower limb: Secondary | ICD-10-CM | POA: Diagnosis not present

## 2015-05-08 DIAGNOSIS — Z6831 Body mass index (BMI) 31.0-31.9, adult: Secondary | ICD-10-CM | POA: Diagnosis not present

## 2015-05-08 DIAGNOSIS — G90529 Complex regional pain syndrome I of unspecified lower limb: Secondary | ICD-10-CM | POA: Diagnosis not present

## 2015-05-08 DIAGNOSIS — M5116 Intervertebral disc disorders with radiculopathy, lumbar region: Secondary | ICD-10-CM | POA: Diagnosis not present

## 2015-05-08 DIAGNOSIS — G8929 Other chronic pain: Secondary | ICD-10-CM | POA: Diagnosis not present

## 2015-05-08 DIAGNOSIS — S33131D Dislocation of L3/L4 lumbar vertebra, subsequent encounter: Secondary | ICD-10-CM | POA: Diagnosis not present

## 2015-07-09 DIAGNOSIS — Z6832 Body mass index (BMI) 32.0-32.9, adult: Secondary | ICD-10-CM | POA: Diagnosis not present

## 2015-07-09 DIAGNOSIS — M654 Radial styloid tenosynovitis [de Quervain]: Secondary | ICD-10-CM | POA: Diagnosis not present

## 2015-07-12 DIAGNOSIS — M654 Radial styloid tenosynovitis [de Quervain]: Secondary | ICD-10-CM | POA: Diagnosis not present

## 2015-07-12 DIAGNOSIS — M25632 Stiffness of left wrist, not elsewhere classified: Secondary | ICD-10-CM | POA: Diagnosis not present

## 2015-07-12 DIAGNOSIS — M79632 Pain in left forearm: Secondary | ICD-10-CM | POA: Diagnosis not present

## 2015-07-12 DIAGNOSIS — M6281 Muscle weakness (generalized): Secondary | ICD-10-CM | POA: Diagnosis not present

## 2015-07-16 DIAGNOSIS — Z21 Asymptomatic human immunodeficiency virus [HIV] infection status: Secondary | ICD-10-CM | POA: Diagnosis not present

## 2015-07-16 DIAGNOSIS — F413 Other mixed anxiety disorders: Secondary | ICD-10-CM | POA: Diagnosis not present

## 2015-07-16 DIAGNOSIS — Z79899 Other long term (current) drug therapy: Secondary | ICD-10-CM | POA: Diagnosis not present

## 2015-07-20 DIAGNOSIS — M6281 Muscle weakness (generalized): Secondary | ICD-10-CM | POA: Diagnosis not present

## 2015-07-20 DIAGNOSIS — M79632 Pain in left forearm: Secondary | ICD-10-CM | POA: Diagnosis not present

## 2015-07-20 DIAGNOSIS — M25632 Stiffness of left wrist, not elsewhere classified: Secondary | ICD-10-CM | POA: Diagnosis not present

## 2015-07-20 DIAGNOSIS — M654 Radial styloid tenosynovitis [de Quervain]: Secondary | ICD-10-CM | POA: Diagnosis not present

## 2015-07-23 DIAGNOSIS — Z6831 Body mass index (BMI) 31.0-31.9, adult: Secondary | ICD-10-CM | POA: Diagnosis not present

## 2015-07-23 DIAGNOSIS — M654 Radial styloid tenosynovitis [de Quervain]: Secondary | ICD-10-CM | POA: Diagnosis not present

## 2015-07-25 DIAGNOSIS — M79632 Pain in left forearm: Secondary | ICD-10-CM | POA: Diagnosis not present

## 2015-07-25 DIAGNOSIS — M654 Radial styloid tenosynovitis [de Quervain]: Secondary | ICD-10-CM | POA: Diagnosis not present

## 2015-07-25 DIAGNOSIS — M25632 Stiffness of left wrist, not elsewhere classified: Secondary | ICD-10-CM | POA: Diagnosis not present

## 2015-07-25 DIAGNOSIS — M6281 Muscle weakness (generalized): Secondary | ICD-10-CM | POA: Diagnosis not present

## 2015-07-27 DIAGNOSIS — M654 Radial styloid tenosynovitis [de Quervain]: Secondary | ICD-10-CM | POA: Diagnosis not present

## 2015-07-27 DIAGNOSIS — M25632 Stiffness of left wrist, not elsewhere classified: Secondary | ICD-10-CM | POA: Diagnosis not present

## 2015-07-27 DIAGNOSIS — M79632 Pain in left forearm: Secondary | ICD-10-CM | POA: Diagnosis not present

## 2015-07-27 DIAGNOSIS — M6281 Muscle weakness (generalized): Secondary | ICD-10-CM | POA: Diagnosis not present

## 2015-07-28 DIAGNOSIS — K625 Hemorrhage of anus and rectum: Secondary | ICD-10-CM | POA: Diagnosis not present

## 2015-08-01 DIAGNOSIS — M79632 Pain in left forearm: Secondary | ICD-10-CM | POA: Diagnosis not present

## 2015-08-01 DIAGNOSIS — M25632 Stiffness of left wrist, not elsewhere classified: Secondary | ICD-10-CM | POA: Diagnosis not present

## 2015-08-01 DIAGNOSIS — M654 Radial styloid tenosynovitis [de Quervain]: Secondary | ICD-10-CM | POA: Diagnosis not present

## 2015-08-01 DIAGNOSIS — M6281 Muscle weakness (generalized): Secondary | ICD-10-CM | POA: Diagnosis not present

## 2015-08-02 DIAGNOSIS — M654 Radial styloid tenosynovitis [de Quervain]: Secondary | ICD-10-CM | POA: Diagnosis not present

## 2015-08-02 DIAGNOSIS — M25632 Stiffness of left wrist, not elsewhere classified: Secondary | ICD-10-CM | POA: Diagnosis not present

## 2015-08-02 DIAGNOSIS — M6281 Muscle weakness (generalized): Secondary | ICD-10-CM | POA: Diagnosis not present

## 2015-08-02 DIAGNOSIS — M79632 Pain in left forearm: Secondary | ICD-10-CM | POA: Diagnosis not present

## 2015-08-07 DIAGNOSIS — E785 Hyperlipidemia, unspecified: Secondary | ICD-10-CM | POA: Diagnosis not present

## 2015-08-07 DIAGNOSIS — N400 Enlarged prostate without lower urinary tract symptoms: Secondary | ICD-10-CM | POA: Diagnosis not present

## 2015-08-07 DIAGNOSIS — M5116 Intervertebral disc disorders with radiculopathy, lumbar region: Secondary | ICD-10-CM | POA: Diagnosis not present

## 2015-08-07 DIAGNOSIS — B20 Human immunodeficiency virus [HIV] disease: Secondary | ICD-10-CM | POA: Diagnosis not present

## 2015-08-07 DIAGNOSIS — G90529 Complex regional pain syndrome I of unspecified lower limb: Secondary | ICD-10-CM | POA: Diagnosis not present

## 2015-08-08 DIAGNOSIS — M25632 Stiffness of left wrist, not elsewhere classified: Secondary | ICD-10-CM | POA: Diagnosis not present

## 2015-08-08 DIAGNOSIS — M654 Radial styloid tenosynovitis [de Quervain]: Secondary | ICD-10-CM | POA: Diagnosis not present

## 2015-08-08 DIAGNOSIS — M6281 Muscle weakness (generalized): Secondary | ICD-10-CM | POA: Diagnosis not present

## 2015-08-08 DIAGNOSIS — M79632 Pain in left forearm: Secondary | ICD-10-CM | POA: Diagnosis not present

## 2015-08-09 DIAGNOSIS — M79632 Pain in left forearm: Secondary | ICD-10-CM | POA: Diagnosis not present

## 2015-08-09 DIAGNOSIS — M6281 Muscle weakness (generalized): Secondary | ICD-10-CM | POA: Diagnosis not present

## 2015-08-09 DIAGNOSIS — M25632 Stiffness of left wrist, not elsewhere classified: Secondary | ICD-10-CM | POA: Diagnosis not present

## 2015-08-09 DIAGNOSIS — M654 Radial styloid tenosynovitis [de Quervain]: Secondary | ICD-10-CM | POA: Diagnosis not present

## 2015-08-13 DIAGNOSIS — M25632 Stiffness of left wrist, not elsewhere classified: Secondary | ICD-10-CM | POA: Diagnosis not present

## 2015-08-13 DIAGNOSIS — M79632 Pain in left forearm: Secondary | ICD-10-CM | POA: Diagnosis not present

## 2015-08-13 DIAGNOSIS — M654 Radial styloid tenosynovitis [de Quervain]: Secondary | ICD-10-CM | POA: Diagnosis not present

## 2015-08-13 DIAGNOSIS — M6281 Muscle weakness (generalized): Secondary | ICD-10-CM | POA: Diagnosis not present

## 2015-08-15 DIAGNOSIS — M654 Radial styloid tenosynovitis [de Quervain]: Secondary | ICD-10-CM | POA: Diagnosis not present

## 2015-08-15 DIAGNOSIS — M25632 Stiffness of left wrist, not elsewhere classified: Secondary | ICD-10-CM | POA: Diagnosis not present

## 2015-08-15 DIAGNOSIS — M79632 Pain in left forearm: Secondary | ICD-10-CM | POA: Diagnosis not present

## 2015-08-15 DIAGNOSIS — M6281 Muscle weakness (generalized): Secondary | ICD-10-CM | POA: Diagnosis not present

## 2015-08-21 DIAGNOSIS — M6281 Muscle weakness (generalized): Secondary | ICD-10-CM | POA: Diagnosis not present

## 2015-08-21 DIAGNOSIS — M25632 Stiffness of left wrist, not elsewhere classified: Secondary | ICD-10-CM | POA: Diagnosis not present

## 2015-08-21 DIAGNOSIS — M654 Radial styloid tenosynovitis [de Quervain]: Secondary | ICD-10-CM | POA: Diagnosis not present

## 2015-08-21 DIAGNOSIS — M79632 Pain in left forearm: Secondary | ICD-10-CM | POA: Diagnosis not present

## 2015-08-28 DIAGNOSIS — M25632 Stiffness of left wrist, not elsewhere classified: Secondary | ICD-10-CM | POA: Diagnosis not present

## 2015-08-28 DIAGNOSIS — M6281 Muscle weakness (generalized): Secondary | ICD-10-CM | POA: Diagnosis not present

## 2015-08-28 DIAGNOSIS — M79632 Pain in left forearm: Secondary | ICD-10-CM | POA: Diagnosis not present

## 2015-08-28 DIAGNOSIS — M654 Radial styloid tenosynovitis [de Quervain]: Secondary | ICD-10-CM | POA: Diagnosis not present

## 2015-09-04 DIAGNOSIS — M654 Radial styloid tenosynovitis [de Quervain]: Secondary | ICD-10-CM | POA: Diagnosis not present

## 2015-09-04 DIAGNOSIS — M79632 Pain in left forearm: Secondary | ICD-10-CM | POA: Diagnosis not present

## 2015-09-04 DIAGNOSIS — M25632 Stiffness of left wrist, not elsewhere classified: Secondary | ICD-10-CM | POA: Diagnosis not present

## 2015-09-04 DIAGNOSIS — M6281 Muscle weakness (generalized): Secondary | ICD-10-CM | POA: Diagnosis not present

## 2015-09-05 DIAGNOSIS — E785 Hyperlipidemia, unspecified: Secondary | ICD-10-CM | POA: Diagnosis not present

## 2015-09-05 DIAGNOSIS — Z23 Encounter for immunization: Secondary | ICD-10-CM | POA: Diagnosis not present

## 2015-09-05 DIAGNOSIS — I10 Essential (primary) hypertension: Secondary | ICD-10-CM | POA: Diagnosis not present

## 2015-09-05 DIAGNOSIS — B20 Human immunodeficiency virus [HIV] disease: Secondary | ICD-10-CM | POA: Diagnosis not present

## 2015-09-05 DIAGNOSIS — M4722 Other spondylosis with radiculopathy, cervical region: Secondary | ICD-10-CM | POA: Diagnosis not present

## 2015-10-01 ENCOUNTER — Other Ambulatory Visit: Payer: Self-pay | Admitting: *Deleted

## 2015-10-01 DIAGNOSIS — R200 Anesthesia of skin: Secondary | ICD-10-CM

## 2015-10-04 ENCOUNTER — Encounter: Payer: Self-pay | Admitting: Diagnostic Neuroimaging

## 2015-10-08 DIAGNOSIS — M722 Plantar fascial fibromatosis: Secondary | ICD-10-CM | POA: Diagnosis not present

## 2015-10-09 ENCOUNTER — Ambulatory Visit (INDEPENDENT_AMBULATORY_CARE_PROVIDER_SITE_OTHER): Payer: Medicare Other | Admitting: Neurology

## 2015-10-09 DIAGNOSIS — R200 Anesthesia of skin: Secondary | ICD-10-CM | POA: Diagnosis not present

## 2015-10-09 NOTE — Procedures (Signed)
[facility]  [address], [address]  [place], [place] [address] Tel: [phone] Fax:  [phone] Test Date:  10/09/2015  Patient: Kevin GeraldsChristopher Pena DOB: [birth year] Physician: Nita Sickleonika Patel  Sex: Male Height: 5\' 9"  Ref Phys: Brent BullaLawrence Perry, M.D.  ID#: [id number] Temp: 32.6C Technician: Judie PetitM. Dean   Patient Complaints: This is a 49 year old gentleman referred for evaluation of bilateral arm pain and shooting paresthesias.  NCV & EMG Findings: Extensive electrodiagnostic testing of the right upper extremity and additional studies of the left shows: 1. Bilateral median, ulnar, and palmar sensory responses are within normal limits. 2. Bilateral median and ulnar motor responses are within normal limits. 3. There is no evidence of active or chronic motor axon loss changes affecting any of the tested muscles. Motor unit configuration and recruitment pattern is within normal limits.  Impression: This is a normal study of the upper extremities.   In particular, there is no evidence of a cervical radiculopathy, sensorimotor polyneuropathy, carpal tunnel syndrome.   _____________________________ Nita Sickleonika Patel, D.O.    Nerve Conduction Studies Anti Sensory Summary Table   Stim Site NR Peak (ms) Norm Peak (ms) P-T Amp (V) Norm P-T Amp  Left Median Anti Sensory (2nd Digit)  Wrist    2.9 <3.6 20.1 >15  Right Median Anti Sensory (2nd Digit)  Wrist    3.3 <3.6 20.2 >15  Left Ulnar Anti Sensory (5th Digit)  Wrist    2.8 <3.1 11.2 >10  Right Ulnar Anti Sensory (5th Digit)  Wrist    2.9 <3.1 18.1 >10   Motor Summary Table   Stim Site NR Onset (ms) Norm Onset (ms) O-P Amp (mV) Norm O-P Amp Site1 Site2 Delta-0 (ms) Dist (cm) Vel (m/s) Norm Vel (m/s)  Left Median Motor (Abd Poll Brev)  Wrist    3.2 <4.0 9.1 >6 Elbow Wrist 4.1 24.0 59 >50  Elbow    7.3  8.2         Right Median Motor (Abd Poll Brev)  Wrist    3.0 <4.0 10.2 >6 Elbow Wrist 5.0 27.0 54 >50  Elbow    8.0  9.8          Left Ulnar Motor (Abd Dig Minimi)  Wrist    2.6 <3.1 7.1 >7 B Elbow Wrist 3.5 19.0 54 >50  B Elbow    6.1  6.0  A Elbow B Elbow 1.6 10.0 62 >50  A Elbow    7.7  5.8         Right Ulnar Motor (Abd Dig Minimi)  Wrist    2.5 <3.1 7.1 >7 B Elbow Wrist 3.7 20.5 55 >50  B Elbow    6.2  7.0  A Elbow B Elbow 1.6 10.0 63 >50  A Elbow    7.8  6.9          Comparison Summary Table   Stim Site NR Peak (ms) Norm Peak (ms) P-T Amp (V) Site1 Site2 Delta-P (ms) Norm Delta (ms)  Left Median/Ulnar Palm Comparison (Wrist - 8cm)  Median Palm    1.9 <2.2 51.1 Median Palm Ulnar Palm 0.0   Ulnar Palm    1.9 <2.2 20.3      Right Median/Ulnar Palm Comparison (Wrist - 8cm)  Median Palm    1.9 <2.2 65.0 Median Palm Ulnar Palm 0.1   Ulnar Palm    1.8 <2.2 23.7       EMG   Side Muscle Ins Act Fibs Psw Fasc Number Recrt Dur  Dur. Amp Amp. Poly Poly. Comment  Right 1stDorInt Nml Nml Nml Nml Nml Nml Nml Nml Nml Nml Nml Nml N/A  Right Abd Poll Brev Nml Nml Nml Nml Nml Nml Nml Nml Nml Nml Nml Nml N/A  Right Ext Indicis Nml Nml Nml Nml Nml Nml Nml Nml Nml Nml Nml Nml N/A  Right PronatorTeres Nml Nml Nml Nml Nml Nml Nml Nml Nml Nml Nml Nml N/A  Right Biceps Nml Nml Nml Nml Nml Nml Nml Nml Nml Nml Nml Nml N/A  Right Triceps Nml Nml Nml Nml Nml Nml Nml Nml Nml Nml Nml Nml N/A  Right Deltoid Nml Nml Nml Nml Nml Nml Nml Nml Nml Nml Nml Nml N/A  Left 1stDorInt Nml Nml Nml Nml Nml Nml Nml Nml Nml Nml Nml Nml N/A  Left Ext Indicis Nml Nml Nml Nml Nml Nml Nml Nml Nml Nml Nml Nml N/A  Left PronatorTeres Nml Nml Nml Nml Nml Nml Nml Nml Nml Nml Nml Nml N/A  Left Biceps Nml Nml Nml Nml Nml Nml Nml Nml Nml Nml Nml Nml N/A  Left Triceps Nml Nml Nml Nml Nml Nml Nml Nml Nml Nml Nml Nml N/A  Left Deltoid Nml Nml Nml Nml Nml Nml Nml Nml Nml Nml Nml Nml N/A      Waveforms:

## 2015-10-17 ENCOUNTER — Telehealth: Payer: Self-pay | Admitting: Diagnostic Neuroimaging

## 2015-10-17 NOTE — Telephone Encounter (Signed)
Pt called and was just hoping that he could get the results for his NCV/EMG study.

## 2015-10-17 NOTE — Telephone Encounter (Signed)
Reviewed chart. It appears that pt has not been seen by [facility] since 2015. Pt had a NCV/EMG performed by Dr. Allena KatzPatel at [facility]. Pt would need to call [facility] at [phone] to get those results. Dr. Marina GoodellPerry was the referring physician and Dr. Allena KatzPatel performed the NCV/EMG. [facility] at [facility].  Left message asking the pt to call us back. If he calls back, please advise him that he needs to call [facility]. Thank you!

## 2015-10-24 DIAGNOSIS — M722 Plantar fascial fibromatosis: Secondary | ICD-10-CM | POA: Diagnosis not present

## 2015-10-24 DIAGNOSIS — M24571 Contracture, right ankle: Secondary | ICD-10-CM | POA: Diagnosis not present

## 2015-11-06 DIAGNOSIS — M5116 Intervertebral disc disorders with radiculopathy, lumbar region: Secondary | ICD-10-CM | POA: Diagnosis not present

## 2015-11-06 DIAGNOSIS — G90529 Complex regional pain syndrome I of unspecified lower limb: Secondary | ICD-10-CM | POA: Diagnosis not present

## 2015-11-06 DIAGNOSIS — B20 Human immunodeficiency virus [HIV] disease: Secondary | ICD-10-CM | POA: Diagnosis not present

## 2015-11-06 DIAGNOSIS — M4722 Other spondylosis with radiculopathy, cervical region: Secondary | ICD-10-CM | POA: Diagnosis not present

## 2015-11-06 DIAGNOSIS — G8929 Other chronic pain: Secondary | ICD-10-CM | POA: Diagnosis not present

## 2015-11-28 DIAGNOSIS — M722 Plantar fascial fibromatosis: Secondary | ICD-10-CM | POA: Diagnosis not present

## 2015-11-28 DIAGNOSIS — M24571 Contracture, right ankle: Secondary | ICD-10-CM | POA: Diagnosis not present

## 2015-12-10 DIAGNOSIS — H0016 Chalazion left eye, unspecified eyelid: Secondary | ICD-10-CM | POA: Diagnosis not present

## 2015-12-17 DIAGNOSIS — Z21 Asymptomatic human immunodeficiency virus [HIV] infection status: Secondary | ICD-10-CM | POA: Diagnosis not present

## 2016-01-04 DIAGNOSIS — M722 Plantar fascial fibromatosis: Secondary | ICD-10-CM | POA: Insufficient documentation

## 2016-01-04 HISTORY — DX: Plantar fascial fibromatosis: M72.2

## 2016-01-24 DIAGNOSIS — K648 Other hemorrhoids: Secondary | ICD-10-CM | POA: Diagnosis not present

## 2016-01-28 DIAGNOSIS — E66811 Other obesity due to excess calories: Secondary | ICD-10-CM | POA: Insufficient documentation

## 2016-01-28 DIAGNOSIS — E669 Obesity, unspecified: Secondary | ICD-10-CM | POA: Insufficient documentation

## 2016-01-28 DIAGNOSIS — K6289 Other specified diseases of anus and rectum: Secondary | ICD-10-CM | POA: Insufficient documentation

## 2016-01-28 DIAGNOSIS — K645 Perianal venous thrombosis: Secondary | ICD-10-CM | POA: Insufficient documentation

## 2016-01-28 HISTORY — DX: Perianal venous thrombosis: K64.5

## 2016-02-05 DIAGNOSIS — I10 Essential (primary) hypertension: Secondary | ICD-10-CM | POA: Diagnosis not present

## 2016-02-05 DIAGNOSIS — E785 Hyperlipidemia, unspecified: Secondary | ICD-10-CM | POA: Diagnosis not present

## 2016-02-05 DIAGNOSIS — M5116 Intervertebral disc disorders with radiculopathy, lumbar region: Secondary | ICD-10-CM | POA: Diagnosis not present

## 2016-02-05 DIAGNOSIS — G90529 Complex regional pain syndrome I of unspecified lower limb: Secondary | ICD-10-CM | POA: Diagnosis not present

## 2016-02-05 DIAGNOSIS — M4722 Other spondylosis with radiculopathy, cervical region: Secondary | ICD-10-CM | POA: Diagnosis not present

## 2016-02-27 DIAGNOSIS — N189 Chronic kidney disease, unspecified: Secondary | ICD-10-CM | POA: Diagnosis not present

## 2016-02-27 DIAGNOSIS — K645 Perianal venous thrombosis: Secondary | ICD-10-CM | POA: Diagnosis not present

## 2016-02-27 DIAGNOSIS — K648 Other hemorrhoids: Secondary | ICD-10-CM | POA: Diagnosis not present

## 2016-02-27 DIAGNOSIS — I129 Hypertensive chronic kidney disease with stage 1 through stage 4 chronic kidney disease, or unspecified chronic kidney disease: Secondary | ICD-10-CM | POA: Diagnosis not present

## 2016-02-27 DIAGNOSIS — Z87891 Personal history of nicotine dependence: Secondary | ICD-10-CM | POA: Diagnosis not present

## 2016-02-27 DIAGNOSIS — K219 Gastro-esophageal reflux disease without esophagitis: Secondary | ICD-10-CM | POA: Diagnosis not present

## 2016-02-27 DIAGNOSIS — E785 Hyperlipidemia, unspecified: Secondary | ICD-10-CM | POA: Diagnosis not present

## 2016-02-27 DIAGNOSIS — J45909 Unspecified asthma, uncomplicated: Secondary | ICD-10-CM | POA: Diagnosis not present

## 2016-03-10 DIAGNOSIS — Z09 Encounter for follow-up examination after completed treatment for conditions other than malignant neoplasm: Secondary | ICD-10-CM

## 2016-03-10 HISTORY — DX: Encounter for follow-up examination after completed treatment for conditions other than malignant neoplasm: Z09

## 2016-05-13 DIAGNOSIS — E785 Hyperlipidemia, unspecified: Secondary | ICD-10-CM | POA: Diagnosis not present

## 2016-05-13 DIAGNOSIS — I10 Essential (primary) hypertension: Secondary | ICD-10-CM | POA: Diagnosis not present

## 2016-05-13 DIAGNOSIS — M5116 Intervertebral disc disorders with radiculopathy, lumbar region: Secondary | ICD-10-CM | POA: Diagnosis not present

## 2016-05-13 DIAGNOSIS — G90529 Complex regional pain syndrome I of unspecified lower limb: Secondary | ICD-10-CM | POA: Diagnosis not present

## 2016-05-13 DIAGNOSIS — G8929 Other chronic pain: Secondary | ICD-10-CM | POA: Diagnosis not present

## 2016-06-16 DIAGNOSIS — F411 Generalized anxiety disorder: Secondary | ICD-10-CM | POA: Diagnosis not present

## 2016-06-16 DIAGNOSIS — Z21 Asymptomatic human immunodeficiency virus [HIV] infection status: Secondary | ICD-10-CM | POA: Diagnosis not present

## 2016-07-23 DIAGNOSIS — E7800 Pure hypercholesterolemia, unspecified: Secondary | ICD-10-CM | POA: Diagnosis not present

## 2016-07-23 DIAGNOSIS — Z881 Allergy status to other antibiotic agents status: Secondary | ICD-10-CM | POA: Diagnosis not present

## 2016-07-23 DIAGNOSIS — N179 Acute kidney failure, unspecified: Secondary | ICD-10-CM | POA: Diagnosis not present

## 2016-07-23 DIAGNOSIS — M79661 Pain in right lower leg: Secondary | ICD-10-CM | POA: Diagnosis not present

## 2016-07-23 DIAGNOSIS — M1990 Unspecified osteoarthritis, unspecified site: Secondary | ICD-10-CM | POA: Diagnosis not present

## 2016-07-23 DIAGNOSIS — R590 Localized enlarged lymph nodes: Secondary | ICD-10-CM | POA: Diagnosis not present

## 2016-07-23 DIAGNOSIS — L03115 Cellulitis of right lower limb: Secondary | ICD-10-CM | POA: Diagnosis not present

## 2016-07-23 DIAGNOSIS — I10 Essential (primary) hypertension: Secondary | ICD-10-CM | POA: Diagnosis not present

## 2016-07-23 DIAGNOSIS — M25551 Pain in right hip: Secondary | ICD-10-CM | POA: Diagnosis not present

## 2016-07-23 DIAGNOSIS — Z888 Allergy status to other drugs, medicaments and biological substances status: Secondary | ICD-10-CM | POA: Diagnosis not present

## 2016-07-23 DIAGNOSIS — Z8614 Personal history of Methicillin resistant Staphylococcus aureus infection: Secondary | ICD-10-CM | POA: Diagnosis not present

## 2016-07-23 DIAGNOSIS — Z79899 Other long term (current) drug therapy: Secondary | ICD-10-CM | POA: Diagnosis not present

## 2016-07-23 DIAGNOSIS — A419 Sepsis, unspecified organism: Secondary | ICD-10-CM | POA: Diagnosis not present

## 2016-07-23 DIAGNOSIS — K219 Gastro-esophageal reflux disease without esophagitis: Secondary | ICD-10-CM | POA: Diagnosis not present

## 2016-07-23 DIAGNOSIS — M7989 Other specified soft tissue disorders: Secondary | ICD-10-CM | POA: Diagnosis not present

## 2016-07-31 DIAGNOSIS — L03115 Cellulitis of right lower limb: Secondary | ICD-10-CM | POA: Diagnosis not present

## 2016-08-12 DIAGNOSIS — I10 Essential (primary) hypertension: Secondary | ICD-10-CM | POA: Diagnosis not present

## 2016-08-12 DIAGNOSIS — E785 Hyperlipidemia, unspecified: Secondary | ICD-10-CM | POA: Diagnosis not present

## 2016-08-12 DIAGNOSIS — G8929 Other chronic pain: Secondary | ICD-10-CM | POA: Diagnosis not present

## 2016-08-12 DIAGNOSIS — G90529 Complex regional pain syndrome I of unspecified lower limb: Secondary | ICD-10-CM | POA: Diagnosis not present

## 2016-08-12 DIAGNOSIS — G20 Parkinson's disease: Secondary | ICD-10-CM | POA: Diagnosis not present

## 2016-08-14 DIAGNOSIS — G4730 Sleep apnea, unspecified: Secondary | ICD-10-CM | POA: Diagnosis not present

## 2016-08-15 DIAGNOSIS — R51 Headache: Secondary | ICD-10-CM | POA: Diagnosis not present

## 2016-08-15 DIAGNOSIS — H04123 Dry eye syndrome of bilateral lacrimal glands: Secondary | ICD-10-CM | POA: Diagnosis not present

## 2016-09-13 DIAGNOSIS — G4733 Obstructive sleep apnea (adult) (pediatric): Secondary | ICD-10-CM | POA: Diagnosis not present

## 2016-09-25 DIAGNOSIS — G4733 Obstructive sleep apnea (adult) (pediatric): Secondary | ICD-10-CM

## 2016-09-25 HISTORY — DX: Obstructive sleep apnea (adult) (pediatric): G47.33

## 2016-11-06 DIAGNOSIS — Z720 Tobacco use: Secondary | ICD-10-CM

## 2016-11-06 HISTORY — DX: Tobacco use: Z72.0

## 2017-04-13 DIAGNOSIS — G4733 Obstructive sleep apnea (adult) (pediatric): Secondary | ICD-10-CM | POA: Diagnosis not present

## 2017-04-18 DIAGNOSIS — G4733 Obstructive sleep apnea (adult) (pediatric): Secondary | ICD-10-CM | POA: Diagnosis not present

## 2017-05-13 DIAGNOSIS — I10 Essential (primary) hypertension: Secondary | ICD-10-CM | POA: Diagnosis not present

## 2017-05-13 DIAGNOSIS — E782 Mixed hyperlipidemia: Secondary | ICD-10-CM | POA: Diagnosis not present

## 2017-05-13 DIAGNOSIS — R7303 Prediabetes: Secondary | ICD-10-CM | POA: Diagnosis not present

## 2017-05-13 DIAGNOSIS — M4716 Other spondylosis with myelopathy, lumbar region: Secondary | ICD-10-CM | POA: Diagnosis not present

## 2017-05-13 DIAGNOSIS — G4730 Sleep apnea, unspecified: Secondary | ICD-10-CM | POA: Diagnosis not present

## 2017-05-19 DIAGNOSIS — G4733 Obstructive sleep apnea (adult) (pediatric): Secondary | ICD-10-CM | POA: Diagnosis not present

## 2017-06-18 DIAGNOSIS — G4733 Obstructive sleep apnea (adult) (pediatric): Secondary | ICD-10-CM | POA: Diagnosis not present

## 2017-06-18 DIAGNOSIS — K589 Irritable bowel syndrome without diarrhea: Secondary | ICD-10-CM | POA: Diagnosis not present

## 2017-06-18 DIAGNOSIS — M79672 Pain in left foot: Secondary | ICD-10-CM | POA: Diagnosis not present

## 2017-06-18 DIAGNOSIS — I10 Essential (primary) hypertension: Secondary | ICD-10-CM | POA: Diagnosis not present

## 2017-06-18 DIAGNOSIS — Z79899 Other long term (current) drug therapy: Secondary | ICD-10-CM | POA: Diagnosis not present

## 2017-06-18 DIAGNOSIS — Z87891 Personal history of nicotine dependence: Secondary | ICD-10-CM | POA: Diagnosis not present

## 2017-06-18 DIAGNOSIS — G629 Polyneuropathy, unspecified: Secondary | ICD-10-CM | POA: Insufficient documentation

## 2017-06-18 DIAGNOSIS — M79671 Pain in right foot: Secondary | ICD-10-CM | POA: Diagnosis not present

## 2017-06-18 DIAGNOSIS — K219 Gastro-esophageal reflux disease without esophagitis: Secondary | ICD-10-CM | POA: Diagnosis not present

## 2017-06-18 HISTORY — DX: Polyneuropathy, unspecified: G62.9

## 2017-07-16 DIAGNOSIS — G4733 Obstructive sleep apnea (adult) (pediatric): Secondary | ICD-10-CM | POA: Diagnosis not present

## 2017-07-19 DIAGNOSIS — G4733 Obstructive sleep apnea (adult) (pediatric): Secondary | ICD-10-CM | POA: Diagnosis not present

## 2017-07-30 DIAGNOSIS — M79671 Pain in right foot: Secondary | ICD-10-CM | POA: Diagnosis not present

## 2017-07-30 DIAGNOSIS — Q6622 Congenital metatarsus adductus: Secondary | ICD-10-CM | POA: Diagnosis not present

## 2017-07-30 DIAGNOSIS — M2060 Acquired deformities of toe(s), unspecified, unspecified foot: Secondary | ICD-10-CM

## 2017-07-30 DIAGNOSIS — Q66229 Congenital metatarsus adductus, unspecified foot: Secondary | ICD-10-CM

## 2017-07-30 DIAGNOSIS — R600 Localized edema: Secondary | ICD-10-CM | POA: Diagnosis not present

## 2017-07-30 HISTORY — DX: Congenital metatarsus adductus, unspecified foot: Q66.229

## 2017-07-30 HISTORY — DX: Acquired deformities of toe(s), unspecified, unspecified foot: M20.60

## 2017-08-02 DIAGNOSIS — Q667 Congenital pes cavus, unspecified foot: Secondary | ICD-10-CM | POA: Insufficient documentation

## 2017-08-02 HISTORY — DX: Congenital pes cavus, unspecified foot: Q66.70

## 2017-08-13 DIAGNOSIS — G4730 Sleep apnea, unspecified: Secondary | ICD-10-CM | POA: Diagnosis not present

## 2017-08-13 DIAGNOSIS — M4716 Other spondylosis with myelopathy, lumbar region: Secondary | ICD-10-CM | POA: Diagnosis not present

## 2017-08-13 DIAGNOSIS — R7303 Prediabetes: Secondary | ICD-10-CM | POA: Diagnosis not present

## 2017-08-13 DIAGNOSIS — I10 Essential (primary) hypertension: Secondary | ICD-10-CM | POA: Diagnosis not present

## 2017-08-13 DIAGNOSIS — E782 Mixed hyperlipidemia: Secondary | ICD-10-CM | POA: Diagnosis not present

## 2017-08-19 DIAGNOSIS — G4733 Obstructive sleep apnea (adult) (pediatric): Secondary | ICD-10-CM | POA: Diagnosis not present

## 2017-09-26 DIAGNOSIS — R51 Headache: Secondary | ICD-10-CM | POA: Diagnosis not present

## 2017-10-19 DIAGNOSIS — G4733 Obstructive sleep apnea (adult) (pediatric): Secondary | ICD-10-CM | POA: Diagnosis not present

## 2017-10-20 DIAGNOSIS — G4733 Obstructive sleep apnea (adult) (pediatric): Secondary | ICD-10-CM | POA: Diagnosis not present

## 2017-11-04 ENCOUNTER — Encounter: Payer: Self-pay | Admitting: *Deleted

## 2017-11-04 ENCOUNTER — Ambulatory Visit (INDEPENDENT_AMBULATORY_CARE_PROVIDER_SITE_OTHER): Payer: Medicare Other | Admitting: Diagnostic Neuroimaging

## 2017-11-04 VITALS — BP 130/74 | HR 68 | Wt 216.0 lb

## 2017-11-04 DIAGNOSIS — G4489 Other headache syndrome: Secondary | ICD-10-CM | POA: Diagnosis not present

## 2017-11-04 DIAGNOSIS — R51 Headache: Secondary | ICD-10-CM | POA: Diagnosis not present

## 2017-11-04 DIAGNOSIS — B20 Human immunodeficiency virus [HIV] disease: Secondary | ICD-10-CM

## 2017-11-04 DIAGNOSIS — G43109 Migraine with aura, not intractable, without status migrainosus: Secondary | ICD-10-CM | POA: Diagnosis not present

## 2017-11-04 DIAGNOSIS — R519 Headache, unspecified: Secondary | ICD-10-CM

## 2017-11-04 MED ORDER — AMITRIPTYLINE HCL 25 MG PO TABS: 25.0000 mg | ORAL_TABLET | Freq: Every day | ORAL | 12 refills | Status: DC

## 2017-11-04 NOTE — Patient Instructions (Signed)
NEW ONSET HEADACHE (tension HA, migraine HA vs other secondary HA) - check MRI brain (has bladder stimulator; will need to see if stimulator is MRI brain compatible) - check ESR, CRP labs - continue CPAP treatments - follow up with ID clinic - start amitriptyline 95m at bedtime for HA prevention - may consider migraine infusion next week if needed
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
## 2017-11-05 ENCOUNTER — Telehealth: Payer: Self-pay | Admitting: *Deleted

## 2017-11-05 LAB — SEDIMENTATION RATE: Sed Rate: 9 mm/hr (ref 0–30)

## 2017-11-05 LAB — C-REACTIVE PROTEIN: CRP: 5.8 mg/L — ABNORMAL HIGH (ref 0.0–4.9)

## 2017-11-05 NOTE — Telephone Encounter (Signed)
LVM requesting call back re: lab results.  

## 2017-11-05 NOTE — Telephone Encounter (Signed)
Received call back from patient. Informed him that his labs results are unremarkable labs. The  ESR is normal, but the  CRP is slightly high but non-specific. Dr Leta Baptist will continue the current plan. Patient has MRI ordered, has not heard about scheduling., This RN advised it may take a few days to go through insurance. Patient stated he started amitriptyline last night, still has headache today. He questioned about what is in the infusion Dr Leta Baptist discussed with him. This RN reviewed Depacon, Toradol and compazine, advised he must have a driver if he receives compazine. Patient stated he will give amitriptyline over the weekend to see how effective it is. He will call next week if he still wakes with headache. He verbalized understanding, appreciation.

## 2017-11-10 NOTE — Telephone Encounter (Signed)
Discussed with Dr Marjory LiesPenumalli. Called patient and advised Dr Marjory LiesPenumalli stated he can order a lower dose of amitriptyline. Patient stated the 25 mg wasn't helping his headaches. He stated he still has same headache. Advised him  Dr Marjory LiesPenumalli stated he could come for an infusion as well. Patient stated he would be able to come tomorrow after 2 pm or Friday morning. This RN advised will discuss with infusion RN and call him back. Patient also stated in conversation that his CPAP mask is leaking, so he pulls it tighter but eventually removes it during the night. This RN suggested he call his DME company and request to be seen/fitted for new mask. Advised that not using his CPAP can cause and/or contribute to headaches. Patient stated he would call today.

## 2017-11-10 NOTE — Telephone Encounter (Signed)
Pt called he will take the appt this Friday at 11:30.  FYI

## 2017-11-10 NOTE — Telephone Encounter (Signed)
Pt calling amitriptyline (ELAVIL) 25 MG tablet makes him feel very hung over the next day. He did not take it last night and is wanting to try something else. He can be reached at [phone]

## 2017-11-10 NOTE — Telephone Encounter (Signed)
Called patient and advised him the only opening for infusion this week is Friday, 11:30. Patient stated he will have to find a ride, stated he would call back. This RN advised he may let phone staff know if he can come. He verbalized understanding.

## 2017-11-10 NOTE — Telephone Encounter (Signed)
Infusion orders signed, given to Chubb Corporationina RN, infusion room.

## 2017-11-13 DIAGNOSIS — G43009 Migraine without aura, not intractable, without status migrainosus: Secondary | ICD-10-CM | POA: Diagnosis not present

## 2017-11-18 ENCOUNTER — Ambulatory Visit (HOSPITAL_COMMUNITY)
Admission: RE | Admit: 2017-11-18 | Discharge: 2017-11-18 | Disposition: A | Discharge status: Home / Self Care | Payer: Medicare Other | Source: Ambulatory Visit | Attending: Diagnostic Neuroimaging | Admitting: Diagnostic Neuroimaging

## 2017-11-18 DIAGNOSIS — G9389 Other specified disorders of brain: Secondary | ICD-10-CM | POA: Insufficient documentation

## 2017-11-18 DIAGNOSIS — G4489 Other headache syndrome: Secondary | ICD-10-CM | POA: Diagnosis not present

## 2017-11-18 DIAGNOSIS — G4733 Obstructive sleep apnea (adult) (pediatric): Secondary | ICD-10-CM | POA: Diagnosis not present

## 2017-11-18 DIAGNOSIS — R51 Headache: Secondary | ICD-10-CM | POA: Diagnosis not present

## 2017-11-18 LAB — CREATININE, SERUM
Creatinine, Ser: 1.31 mg/dL — ABNORMAL HIGH (ref 0.61–1.24)
GFR calc Af Amer: >60 mL/min (ref >60)
GFR calc non Af Amer: >60 mL/min (ref >60)

## 2017-11-18 MED ORDER — GADOBENATE DIMEGLUMINE 529 MG/ML IV SOLN
20.0000 mL | Freq: Once | INTRAVENOUS | Status: AC | PRN
  Administered 2017-11-18: 20 mL via INTRAVENOUS

## 2017-11-19 DIAGNOSIS — Z87891 Personal history of nicotine dependence: Secondary | ICD-10-CM | POA: Diagnosis not present

## 2017-11-19 DIAGNOSIS — Z23 Encounter for immunization: Secondary | ICD-10-CM | POA: Diagnosis not present

## 2017-11-19 DIAGNOSIS — Z79899 Other long term (current) drug therapy: Secondary | ICD-10-CM | POA: Diagnosis not present

## 2017-11-19 DIAGNOSIS — I10 Essential (primary) hypertension: Secondary | ICD-10-CM | POA: Diagnosis not present

## 2017-11-19 DIAGNOSIS — R51 Headache: Secondary | ICD-10-CM | POA: Diagnosis not present

## 2017-11-20 DIAGNOSIS — E782 Mixed hyperlipidemia: Secondary | ICD-10-CM | POA: Diagnosis not present

## 2017-11-20 DIAGNOSIS — R7303 Prediabetes: Secondary | ICD-10-CM | POA: Diagnosis not present

## 2017-11-20 DIAGNOSIS — I10 Essential (primary) hypertension: Secondary | ICD-10-CM | POA: Diagnosis not present

## 2017-11-20 DIAGNOSIS — G4730 Sleep apnea, unspecified: Secondary | ICD-10-CM | POA: Diagnosis not present

## 2017-11-20 DIAGNOSIS — M4716 Other spondylosis with myelopathy, lumbar region: Secondary | ICD-10-CM | POA: Diagnosis not present

## 2017-11-30 DIAGNOSIS — R51 Headache: Secondary | ICD-10-CM | POA: Diagnosis not present

## 2017-11-30 DIAGNOSIS — M542 Cervicalgia: Secondary | ICD-10-CM | POA: Diagnosis not present

## 2017-11-30 DIAGNOSIS — I6782 Cerebral ischemia: Secondary | ICD-10-CM | POA: Diagnosis not present

## 2017-12-02 DIAGNOSIS — M542 Cervicalgia: Secondary | ICD-10-CM | POA: Diagnosis not present

## 2017-12-02 DIAGNOSIS — I6782 Cerebral ischemia: Secondary | ICD-10-CM | POA: Diagnosis not present

## 2017-12-02 DIAGNOSIS — R51 Headache: Secondary | ICD-10-CM | POA: Diagnosis not present

## 2017-12-04 DIAGNOSIS — M542 Cervicalgia: Secondary | ICD-10-CM | POA: Diagnosis not present

## 2017-12-04 DIAGNOSIS — R51 Headache: Secondary | ICD-10-CM | POA: Diagnosis not present

## 2017-12-04 DIAGNOSIS — I6782 Cerebral ischemia: Secondary | ICD-10-CM | POA: Diagnosis not present

## 2017-12-07 DIAGNOSIS — R51 Headache: Secondary | ICD-10-CM | POA: Diagnosis not present

## 2017-12-07 DIAGNOSIS — M542 Cervicalgia: Secondary | ICD-10-CM | POA: Diagnosis not present

## 2017-12-07 DIAGNOSIS — I6782 Cerebral ischemia: Secondary | ICD-10-CM | POA: Diagnosis not present

## 2017-12-09 DIAGNOSIS — M542 Cervicalgia: Secondary | ICD-10-CM | POA: Diagnosis not present

## 2017-12-09 DIAGNOSIS — S0501XA Injury of conjunctiva and corneal abrasion without foreign body, right eye, initial encounter: Secondary | ICD-10-CM | POA: Diagnosis not present

## 2017-12-09 DIAGNOSIS — I6782 Cerebral ischemia: Secondary | ICD-10-CM | POA: Diagnosis not present

## 2017-12-09 DIAGNOSIS — R51 Headache: Secondary | ICD-10-CM | POA: Diagnosis not present

## 2017-12-11 DIAGNOSIS — I6782 Cerebral ischemia: Secondary | ICD-10-CM | POA: Diagnosis not present

## 2017-12-11 DIAGNOSIS — R51 Headache: Secondary | ICD-10-CM | POA: Diagnosis not present

## 2017-12-11 DIAGNOSIS — M542 Cervicalgia: Secondary | ICD-10-CM | POA: Diagnosis not present

## 2017-12-14 DIAGNOSIS — I6782 Cerebral ischemia: Secondary | ICD-10-CM | POA: Diagnosis not present

## 2017-12-14 DIAGNOSIS — M542 Cervicalgia: Secondary | ICD-10-CM | POA: Diagnosis not present

## 2017-12-14 DIAGNOSIS — R51 Headache: Secondary | ICD-10-CM | POA: Diagnosis not present

## 2017-12-16 DIAGNOSIS — M542 Cervicalgia: Secondary | ICD-10-CM | POA: Diagnosis not present

## 2017-12-16 DIAGNOSIS — I6782 Cerebral ischemia: Secondary | ICD-10-CM | POA: Diagnosis not present

## 2017-12-16 DIAGNOSIS — R51 Headache: Secondary | ICD-10-CM | POA: Diagnosis not present

## 2017-12-18 DIAGNOSIS — M542 Cervicalgia: Secondary | ICD-10-CM | POA: Diagnosis not present

## 2017-12-18 DIAGNOSIS — I6782 Cerebral ischemia: Secondary | ICD-10-CM | POA: Diagnosis not present

## 2017-12-18 DIAGNOSIS — R51 Headache: Secondary | ICD-10-CM | POA: Diagnosis not present

## 2017-12-19 DIAGNOSIS — G4733 Obstructive sleep apnea (adult) (pediatric): Secondary | ICD-10-CM | POA: Diagnosis not present

## 2017-12-23 DIAGNOSIS — R51 Headache: Secondary | ICD-10-CM | POA: Diagnosis not present

## 2017-12-23 DIAGNOSIS — M542 Cervicalgia: Secondary | ICD-10-CM | POA: Diagnosis not present

## 2017-12-23 DIAGNOSIS — I6782 Cerebral ischemia: Secondary | ICD-10-CM | POA: Diagnosis not present

## 2017-12-25 DIAGNOSIS — I6782 Cerebral ischemia: Secondary | ICD-10-CM | POA: Diagnosis not present

## 2017-12-25 DIAGNOSIS — M542 Cervicalgia: Secondary | ICD-10-CM | POA: Diagnosis not present

## 2017-12-25 DIAGNOSIS — R51 Headache: Secondary | ICD-10-CM | POA: Diagnosis not present

## 2017-12-28 DIAGNOSIS — R51 Headache: Secondary | ICD-10-CM | POA: Diagnosis not present

## 2017-12-28 DIAGNOSIS — M542 Cervicalgia: Secondary | ICD-10-CM | POA: Diagnosis not present

## 2017-12-28 DIAGNOSIS — I6782 Cerebral ischemia: Secondary | ICD-10-CM | POA: Diagnosis not present

## 2017-12-29 ENCOUNTER — Telehealth: Payer: Self-pay | Admitting: *Deleted

## 2017-12-29 DIAGNOSIS — G43109 Migraine with aura, not intractable, without status migrainosus: Secondary | ICD-10-CM

## 2017-12-29 DIAGNOSIS — R519 Headache, unspecified: Secondary | ICD-10-CM

## 2017-12-29 DIAGNOSIS — R51 Headache: Principal | ICD-10-CM

## 2017-12-29 MED ORDER — AMITRIPTYLINE HCL 25 MG PO TABS: 25.0000 mg | ORAL_TABLET | Freq: Every day | ORAL | 2 refills | Status: DC

## 2017-12-29 NOTE — Telephone Encounter (Signed)
Received fax from [facility]; patient requested 90 day supply. Reordered for 90 day supply, 2 refills. Pt has FU in June 2019.

## 2017-12-30 DIAGNOSIS — R51 Headache: Secondary | ICD-10-CM | POA: Diagnosis not present

## 2017-12-30 DIAGNOSIS — M542 Cervicalgia: Secondary | ICD-10-CM | POA: Diagnosis not present

## 2017-12-30 DIAGNOSIS — I6782 Cerebral ischemia: Secondary | ICD-10-CM | POA: Diagnosis not present

## 2018-01-04 DIAGNOSIS — I6782 Cerebral ischemia: Secondary | ICD-10-CM | POA: Diagnosis not present

## 2018-01-04 DIAGNOSIS — M542 Cervicalgia: Secondary | ICD-10-CM | POA: Diagnosis not present

## 2018-01-04 DIAGNOSIS — R51 Headache: Secondary | ICD-10-CM | POA: Diagnosis not present

## 2018-01-06 DIAGNOSIS — R51 Headache: Secondary | ICD-10-CM | POA: Diagnosis not present

## 2018-01-06 DIAGNOSIS — I6782 Cerebral ischemia: Secondary | ICD-10-CM | POA: Diagnosis not present

## 2018-01-06 DIAGNOSIS — M542 Cervicalgia: Secondary | ICD-10-CM | POA: Diagnosis not present

## 2018-01-08 DIAGNOSIS — R51 Headache: Secondary | ICD-10-CM | POA: Diagnosis not present

## 2018-01-08 DIAGNOSIS — I6782 Cerebral ischemia: Secondary | ICD-10-CM | POA: Diagnosis not present

## 2018-01-08 DIAGNOSIS — M542 Cervicalgia: Secondary | ICD-10-CM | POA: Diagnosis not present

## 2018-01-11 DIAGNOSIS — I6782 Cerebral ischemia: Secondary | ICD-10-CM | POA: Diagnosis not present

## 2018-01-11 DIAGNOSIS — R51 Headache: Secondary | ICD-10-CM | POA: Diagnosis not present

## 2018-01-11 DIAGNOSIS — M542 Cervicalgia: Secondary | ICD-10-CM | POA: Diagnosis not present

## 2018-01-13 DIAGNOSIS — I6782 Cerebral ischemia: Secondary | ICD-10-CM | POA: Diagnosis not present

## 2018-01-13 DIAGNOSIS — R51 Headache: Secondary | ICD-10-CM | POA: Diagnosis not present

## 2018-01-13 DIAGNOSIS — M542 Cervicalgia: Secondary | ICD-10-CM | POA: Diagnosis not present

## 2018-01-15 DIAGNOSIS — R51 Headache: Secondary | ICD-10-CM | POA: Diagnosis not present

## 2018-01-15 DIAGNOSIS — M542 Cervicalgia: Secondary | ICD-10-CM | POA: Diagnosis not present

## 2018-01-15 DIAGNOSIS — I6782 Cerebral ischemia: Secondary | ICD-10-CM | POA: Diagnosis not present

## 2018-01-18 DIAGNOSIS — R51 Headache: Secondary | ICD-10-CM | POA: Diagnosis not present

## 2018-01-18 DIAGNOSIS — I6782 Cerebral ischemia: Secondary | ICD-10-CM | POA: Diagnosis not present

## 2018-01-18 DIAGNOSIS — M542 Cervicalgia: Secondary | ICD-10-CM | POA: Diagnosis not present

## 2018-01-19 DIAGNOSIS — G4733 Obstructive sleep apnea (adult) (pediatric): Secondary | ICD-10-CM | POA: Diagnosis not present

## 2018-01-20 DIAGNOSIS — M542 Cervicalgia: Secondary | ICD-10-CM | POA: Diagnosis not present

## 2018-01-20 DIAGNOSIS — I6782 Cerebral ischemia: Secondary | ICD-10-CM | POA: Diagnosis not present

## 2018-01-20 DIAGNOSIS — R51 Headache: Secondary | ICD-10-CM | POA: Diagnosis not present

## 2018-01-22 DIAGNOSIS — I6782 Cerebral ischemia: Secondary | ICD-10-CM | POA: Diagnosis not present

## 2018-01-22 DIAGNOSIS — M542 Cervicalgia: Secondary | ICD-10-CM | POA: Diagnosis not present

## 2018-01-22 DIAGNOSIS — R51 Headache: Secondary | ICD-10-CM | POA: Diagnosis not present

## 2018-01-25 DIAGNOSIS — I6782 Cerebral ischemia: Secondary | ICD-10-CM | POA: Diagnosis not present

## 2018-01-25 DIAGNOSIS — R51 Headache: Secondary | ICD-10-CM | POA: Diagnosis not present

## 2018-01-25 DIAGNOSIS — M542 Cervicalgia: Secondary | ICD-10-CM | POA: Diagnosis not present

## 2018-01-27 DIAGNOSIS — I6782 Cerebral ischemia: Secondary | ICD-10-CM | POA: Diagnosis not present

## 2018-01-27 DIAGNOSIS — M542 Cervicalgia: Secondary | ICD-10-CM | POA: Diagnosis not present

## 2018-01-27 DIAGNOSIS — R51 Headache: Secondary | ICD-10-CM | POA: Diagnosis not present

## 2018-01-29 DIAGNOSIS — I6782 Cerebral ischemia: Secondary | ICD-10-CM | POA: Diagnosis not present

## 2018-01-29 DIAGNOSIS — M542 Cervicalgia: Secondary | ICD-10-CM | POA: Diagnosis not present

## 2018-01-29 DIAGNOSIS — R51 Headache: Secondary | ICD-10-CM | POA: Diagnosis not present

## 2018-02-01 DIAGNOSIS — R51 Headache: Secondary | ICD-10-CM | POA: Diagnosis not present

## 2018-02-01 DIAGNOSIS — M542 Cervicalgia: Secondary | ICD-10-CM | POA: Diagnosis not present

## 2018-02-01 DIAGNOSIS — I6782 Cerebral ischemia: Secondary | ICD-10-CM | POA: Diagnosis not present

## 2018-02-02 DIAGNOSIS — G4733 Obstructive sleep apnea (adult) (pediatric): Secondary | ICD-10-CM | POA: Diagnosis not present

## 2018-02-05 DIAGNOSIS — R51 Headache: Secondary | ICD-10-CM | POA: Diagnosis not present

## 2018-02-05 DIAGNOSIS — I6782 Cerebral ischemia: Secondary | ICD-10-CM | POA: Diagnosis not present

## 2018-02-05 DIAGNOSIS — M542 Cervicalgia: Secondary | ICD-10-CM | POA: Diagnosis not present

## 2018-02-08 DIAGNOSIS — R51 Headache: Secondary | ICD-10-CM | POA: Diagnosis not present

## 2018-02-08 DIAGNOSIS — I6782 Cerebral ischemia: Secondary | ICD-10-CM | POA: Diagnosis not present

## 2018-02-08 DIAGNOSIS — M542 Cervicalgia: Secondary | ICD-10-CM | POA: Diagnosis not present

## 2018-02-10 DIAGNOSIS — I6782 Cerebral ischemia: Secondary | ICD-10-CM | POA: Diagnosis not present

## 2018-02-10 DIAGNOSIS — R51 Headache: Secondary | ICD-10-CM | POA: Diagnosis not present

## 2018-02-10 DIAGNOSIS — M542 Cervicalgia: Secondary | ICD-10-CM | POA: Diagnosis not present

## 2018-02-12 DIAGNOSIS — R51 Headache: Secondary | ICD-10-CM | POA: Diagnosis not present

## 2018-02-12 DIAGNOSIS — I6782 Cerebral ischemia: Secondary | ICD-10-CM | POA: Diagnosis not present

## 2018-02-12 DIAGNOSIS — M542 Cervicalgia: Secondary | ICD-10-CM | POA: Diagnosis not present

## 2018-02-15 DIAGNOSIS — R51 Headache: Secondary | ICD-10-CM | POA: Diagnosis not present

## 2018-02-15 DIAGNOSIS — M542 Cervicalgia: Secondary | ICD-10-CM | POA: Diagnosis not present

## 2018-02-15 DIAGNOSIS — I6782 Cerebral ischemia: Secondary | ICD-10-CM | POA: Diagnosis not present

## 2018-02-16 DIAGNOSIS — G4733 Obstructive sleep apnea (adult) (pediatric): Secondary | ICD-10-CM | POA: Diagnosis not present

## 2018-02-17 DIAGNOSIS — I6782 Cerebral ischemia: Secondary | ICD-10-CM | POA: Diagnosis not present

## 2018-02-17 DIAGNOSIS — R51 Headache: Secondary | ICD-10-CM | POA: Diagnosis not present

## 2018-02-17 DIAGNOSIS — M542 Cervicalgia: Secondary | ICD-10-CM | POA: Diagnosis not present

## 2018-02-19 DIAGNOSIS — E782 Mixed hyperlipidemia: Secondary | ICD-10-CM | POA: Diagnosis not present

## 2018-02-19 DIAGNOSIS — R7303 Prediabetes: Secondary | ICD-10-CM | POA: Diagnosis not present

## 2018-02-19 DIAGNOSIS — G4730 Sleep apnea, unspecified: Secondary | ICD-10-CM | POA: Diagnosis not present

## 2018-02-19 DIAGNOSIS — M4716 Other spondylosis with myelopathy, lumbar region: Secondary | ICD-10-CM | POA: Diagnosis not present

## 2018-02-19 DIAGNOSIS — I10 Essential (primary) hypertension: Secondary | ICD-10-CM | POA: Diagnosis not present

## 2018-02-24 DIAGNOSIS — M542 Cervicalgia: Secondary | ICD-10-CM | POA: Diagnosis not present

## 2018-02-24 DIAGNOSIS — R51 Headache: Secondary | ICD-10-CM | POA: Diagnosis not present

## 2018-02-24 DIAGNOSIS — I6782 Cerebral ischemia: Secondary | ICD-10-CM | POA: Diagnosis not present

## 2018-03-04 DIAGNOSIS — J301 Allergic rhinitis due to pollen: Secondary | ICD-10-CM | POA: Diagnosis not present

## 2018-03-04 DIAGNOSIS — R590 Localized enlarged lymph nodes: Secondary | ICD-10-CM | POA: Diagnosis not present

## 2018-03-10 DIAGNOSIS — M542 Cervicalgia: Secondary | ICD-10-CM | POA: Diagnosis not present

## 2018-03-10 DIAGNOSIS — R51 Headache: Secondary | ICD-10-CM | POA: Diagnosis not present

## 2018-03-10 DIAGNOSIS — I6782 Cerebral ischemia: Secondary | ICD-10-CM | POA: Diagnosis not present

## 2018-03-11 DIAGNOSIS — G4733 Obstructive sleep apnea (adult) (pediatric): Secondary | ICD-10-CM | POA: Diagnosis not present

## 2018-04-22 DIAGNOSIS — Z79899 Other long term (current) drug therapy: Secondary | ICD-10-CM | POA: Diagnosis not present

## 2018-05-03 DIAGNOSIS — G4733 Obstructive sleep apnea (adult) (pediatric): Secondary | ICD-10-CM | POA: Diagnosis not present

## 2018-05-19 ENCOUNTER — Ambulatory Visit: Payer: Self-pay | Admitting: Diagnostic Neuroimaging

## 2018-05-25 DIAGNOSIS — R7303 Prediabetes: Secondary | ICD-10-CM | POA: Diagnosis not present

## 2018-05-25 DIAGNOSIS — E782 Mixed hyperlipidemia: Secondary | ICD-10-CM | POA: Diagnosis not present

## 2018-05-25 DIAGNOSIS — I10 Essential (primary) hypertension: Secondary | ICD-10-CM | POA: Diagnosis not present

## 2018-05-25 DIAGNOSIS — M4716 Other spondylosis with myelopathy, lumbar region: Secondary | ICD-10-CM | POA: Diagnosis not present

## 2018-05-25 DIAGNOSIS — G4730 Sleep apnea, unspecified: Secondary | ICD-10-CM | POA: Diagnosis not present

## 2018-05-26 ENCOUNTER — Ambulatory Visit (INDEPENDENT_AMBULATORY_CARE_PROVIDER_SITE_OTHER): Payer: Medicare Other | Admitting: Diagnostic Neuroimaging

## 2018-05-26 ENCOUNTER — Encounter: Payer: Self-pay | Admitting: Diagnostic Neuroimaging

## 2018-05-26 VITALS — BP 101/65 | HR 56 | Ht 70.0 in | Wt 190.4 lb

## 2018-05-26 DIAGNOSIS — G4489 Other headache syndrome: Secondary | ICD-10-CM | POA: Diagnosis not present
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
## 2018-06-22 DIAGNOSIS — M4716 Other spondylosis with myelopathy, lumbar region: Secondary | ICD-10-CM | POA: Diagnosis not present

## 2018-06-22 DIAGNOSIS — R5383 Other fatigue: Secondary | ICD-10-CM | POA: Diagnosis not present

## 2018-06-23 ENCOUNTER — Other Ambulatory Visit: Payer: Self-pay

## 2018-06-23 ENCOUNTER — Encounter (HOSPITAL_COMMUNITY): Payer: Self-pay | Admitting: Emergency Medicine

## 2018-06-23 ENCOUNTER — Emergency Department (HOSPITAL_COMMUNITY)
Admission: EM | Admit: 2018-06-23 | Discharge: 2018-06-23 | Disposition: A | Discharge status: Home / Self Care | Payer: Medicare Other | Attending: Emergency Medicine | Admitting: Emergency Medicine

## 2018-06-23 DIAGNOSIS — Z79899 Other long term (current) drug therapy: Secondary | ICD-10-CM | POA: Insufficient documentation

## 2018-06-23 DIAGNOSIS — R5383 Other fatigue: Secondary | ICD-10-CM | POA: Diagnosis not present

## 2018-06-23 DIAGNOSIS — Z21 Asymptomatic human immunodeficiency virus [HIV] infection status: Secondary | ICD-10-CM | POA: Diagnosis not present

## 2018-06-23 DIAGNOSIS — M791 Myalgia, unspecified site: Secondary | ICD-10-CM | POA: Diagnosis not present

## 2018-06-23 DIAGNOSIS — Z87891 Personal history of nicotine dependence: Secondary | ICD-10-CM | POA: Insufficient documentation

## 2018-06-23 DIAGNOSIS — I10 Essential (primary) hypertension: Secondary | ICD-10-CM | POA: Diagnosis not present

## 2018-06-23 LAB — COMPREHENSIVE METABOLIC PANEL
ALBUMIN: 4.1 g/dL (ref 3.5–5.0)
ALK PHOS: 76 U/L (ref 38–126)
ALT: 28 U/L (ref 0–44)
AST: 26 U/L (ref 15–41)
Anion gap: 8 (ref 5–15)
BILIRUBIN TOTAL: 0.4 mg/dL (ref 0.3–1.2)
BUN: 17 mg/dL (ref 6–20)
CALCIUM: 9.5 mg/dL (ref 8.9–10.3)
CO2: 25 mmol/L (ref 22–32)
CREATININE: 1.08 mg/dL (ref 0.61–1.24)
Chloride: 110 mmol/L (ref 98–111)
GFR calc Af Amer: >60 mL/min (ref >60)
GLUCOSE: 92 mg/dL (ref 70–99)
Potassium: 4.7 mmol/L (ref 3.5–5.1)
Sodium: 143 mmol/L (ref 135–145)
TOTAL PROTEIN: 7 g/dL (ref 6.5–8.1)

## 2018-06-23 LAB — URINALYSIS, ROUTINE W REFLEX MICROSCOPIC
BILIRUBIN URINE: NEGATIVE
Glucose, UA: NEGATIVE mg/dL
Hgb urine dipstick: NEGATIVE
KETONES UR: NEGATIVE mg/dL
Leukocytes, UA: NEGATIVE
NITRITE: NEGATIVE
PROTEIN: NEGATIVE mg/dL
Specific Gravity, Urine: 1.018 (ref 1.005–1.030)
pH: 5 (ref 5.0–8.0)

## 2018-06-23 LAB — CBC
HCT: 46.2 % (ref 39.0–52.0)
Hemoglobin: 16 g/dL (ref 13.0–17.0)
MCH: 33 pg (ref 26.0–34.0)
MCHC: 34.6 g/dL (ref 30.0–36.0)
MCV: 95.3 fL (ref 78.0–100.0)
PLATELETS: 206 10*3/uL (ref 150–400)
RBC: 4.85 MIL/uL (ref 4.22–5.81)
RDW: 14.2 % (ref 11.5–15.5)
WBC: 9.2 10*3/uL (ref 4.0–10.5)

## 2018-06-23 LAB — CK: CK TOTAL: 100 U/L (ref 49–397)

## 2018-06-23 LAB — LIPASE, BLOOD: Lipase: 34 U/L (ref 11–51)

## 2018-06-23 NOTE — ED Provider Notes (Signed)
[facility]-EMERGENCY DEPT Provider Note   CSN: [id number] Arrival date & time: 06/23/18  1812     History   Chief Complaint Chief Complaint  Patient presents with  . Fatigue  . Generalized Body Aches    HPI Kevin Pena is a 52 y.o. male.  HPI Patient reports he has had general fatigue and all of his muscles ache.  This is been going on for about the past week.  He reports he usually gets out and walks to get exercise.  He is trying to eat healthy.  He reports over this past week though when he takes his usual walk he has to come home and rest for a long time and is sleeping a lot.  He has not developed any fever no rash, no abdominal pain or headache.  Patient has long history of HIV and has been compliant with medications.  He reports he is been on the same medications for many years without problems.  No new medications.  Patient's nieces and nephews had all had a febrile illness several weeks ago.  They had a lot of fatigue but then all got better.  They had been checked at the hospital but there was no specific diagnosis except viral illness. Past Medical History:  Diagnosis Date  . Arthritis   . Frequency of urination   . GERD (gastroesophageal reflux disease)   . 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 HIV positive (HCC) 1986  . Hyperlipidemia   . Hypertension   . Internal hemorrhoids   . PONV (postoperative nausea and vomiting)   . Rash left leg-- using topical cream  . Urge urinary incontinence     Patient Active Problem List   Diagnosis Date Noted  . Encounter for colorectal cancer screening 01/09/2015  . Nausea with vomiting 10/10/2014  . Fecal urgency 09/15/2014  . Anxiety disorder 09/01/2014  . HIV (human immunodeficiency virus infection) (HCC) 09/01/2014  . 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 Cellulitis of lower leg 03/29/2014  . BP  (high blood pressure) 03/29/2014  . HLD (hyperlipidemia) 03/29/2014    Past Surgical History:  Procedure Laterality Date  . APPENDECTOMY  1981  . CHOLECYSTECTOMY  2006  . FOOT SURGERY    . HAMMER TOE SURGERY  OCT 2010  . INTERSTIM IMPLANT PLACEMENT  06/17/2012   Procedure: Leane Platt IMPLANT FIRST STAGE;  Surgeon: Martina Sinner, MD;  Location[facility];  Service: Urology;  Laterality: N/A;  RAD TECH OK PER VICKIE AT MAIN OR   . INTERSTIM IMPLANT PLACEMENT  06/17/2012   Procedure: Leane Platt IMPLANT SECOND STAGE;  Surgeon: Martina Sinner, MD;  Location: [facility];  Service: Urology;  Laterality: N/A;  . REPAIR RECURRENT RIGHT INGUINAL HERNIA  2000  . RIGHT INGUINAL HERNIA REPAIR  1997  . UMBILICAL HERNIA REPAIR  2010        Home Medications    Prior to Admission medications   Medication Sig Start Date End Date Taking? Authorizing Provider  atorvastatin (LIPITOR) 20 MG tablet Take 1 tablet by mouth at bedtime.  08/01/14  Yes [provider]  diazepam (VALIUM) 5 MG tablet Take 10 mg by mouth at bedtime. scheduled   Yes [provider]  efavirenz-emtrictabine-tenofovir (ATRIPLA) 600-200-300 MG per tablet Take 1 tablet by mouth at bedtime.   Yes [provider]  esomeprazole (NEXIUM) 40 MG capsule Take 40 mg  by mouth at bedtime.  05/07/18  Yes [provider]  hydrochlorothiazide (HYDRODIURIL) 25 MG tablet Take 25 mg by mouth every evening.    Yes [provider]  oxyCODONE (ROXICODONE) 15 MG immediate release tablet TAKE 1 TAB. EVERY 6 HOURS.1T BY MOUTH EVERY 6 HOURS AS NEEDED FOR PAIN 10/12/17  Yes [provider]  amitriptyline (ELAVIL) 25 MG tablet Take 1 tablet (25 mg total) by mouth at bedtime. Patient not taking: Reported on 05/26/2018 12/29/17   Penumalli, Glenford Bayley, MD  fluticasone (FLONASE) 50 MCG/ACT nasal spray SPRAY 1 SPRAY INTO EACH NOSTRIL EVERY DAY AS NEEDED FOR ALLERGIES 05/25/18    [provider]  omeprazole-sodium bicarbonate (ZEGERID) 40-1100 MG per capsule Take 1 capsule by mouth daily before breakfast. Patient not taking: Reported on 06/23/2018 01/31/15   Louis Meckel, MD  PROAIR HFA 108 (90 BASE) MCG/ACT inhaler Inhale 2 puffs into the lungs every 6 (six) hours as needed for wheezing or shortness of breath.  09/05/14   [provider]  QVAR 40 MCG/ACT inhaler Inhale 1 puff into the lungs daily as needed (wheezing and sob).  09/05/14   [provider]  valACYclovir (VALTREX) 500 MG tablet Take 1 tablet by mouth daily as needed (outbreaks).  09/14/14   [provider]    Family History Family History  Problem Relation Age of Onset  . Other Mother        colon resection, tumor near liver  . Diabetes Mother   . Hyperlipidemia Mother   . Hypertension Mother   . Varicose Veins Mother   . Heart Problems Father   . Diabetes Father   . Heart disease Father   . Hyperlipidemia Father   . Hypertension Father   . Pancreatic cancer Neg Hx   . Rectal cancer Neg Hx   . Stomach cancer Neg Hx   . Colon cancer Neg Hx     Social History Social History   Tobacco Use  . Smoking status: Former Smoker    Packs/day: 1.00    Years: 30.00    Pack years: 30.00    Types: Cigarettes    Last attempt to quit: 06/11/2010    Years since quitting: 8.0  . Smokeless tobacco: Never Used  Substance Use Topics  . Alcohol use: Yes    Alcohol/week: 0.0 oz    Comment: rarely  . Drug use: No     Allergies   Adhesive [tape]; Ampicillin; Doxycycline; and Lyrica [pregabalin]   Review of Systems Review of Systems 10 Systems reviewed and are negative for acute change except as noted in the HPI.   Physical Exam Updated Vital Signs BP 112/82 (BP Location: Right Arm)   Pulse 60   Temp 98.1 F (36.7 C) (Oral)   Resp 18   Ht 5\' 9"  (1.753 m)   Wt 86.2 kg (190 lb)   SpO2 100%   BMI 28.06 kg/m   Physical Exam  Constitutional: He is  oriented to person, place, and time. He appears well-developed and well-nourished.  HENT:  Head: Normocephalic and atraumatic.  Mouth/Throat: Oropharynx is clear and moist.  Posterior oropharynx widely patent no erythema.  Eyes: Pupils are equal, round, and reactive to light. EOM are normal.  Neck: Neck supple.  Cardiovascular: Normal rate, regular rhythm, normal heart sounds and intact distal pulses.  Pulmonary/Chest: Effort normal and breath sounds normal.  Abdominal: Soft. Bowel sounds are normal. He exhibits no distension. There is no tenderness.  Musculoskeletal: Normal range of  motion. He exhibits no edema or tenderness.  Neurological: He is alert and oriented to person, place, and time. He has normal strength. He exhibits normal muscle tone. Coordination normal. GCS eye subscore is 4. GCS verbal subscore is 5. GCS motor subscore is 6.  Skin: Skin is warm, dry and intact. No rash noted.  Psychiatric: He has a normal mood and affect.     ED Treatments / Results  Labs (all labs ordered are listed, but only abnormal results are displayed) Labs Reviewed  LIPASE, BLOOD  COMPREHENSIVE METABOLIC PANEL  CBC  URINALYSIS, ROUTINE W REFLEX MICROSCOPIC  CK    EKG None  Radiology No results found.  Procedures Procedures (including critical care time)  Medications Ordered in ED Medications - No data to display   Initial Impression / Assessment and Plan / ED Course  I have reviewed the triage vital signs and the nursing notes.  Pertinent labs & imaging results that were available during my care of the patient were reviewed by me and considered in my medical decision making (see chart for details).      Final Clinical Impressions(s) / ED Diagnoses   Final diagnoses:  Myalgia  Fatigue, unspecified type  Patient is clinically well in appearance and he reports he has been improving slightly.  He had labs done as an outpatient yesterday and had a leukocytosis of approximately  12,000.  That has improved and normalized today.  Patient does have history of HIV but has been well controlled and is compliant with medications.  At this time I do not see indication of any acute complications.  Lab work is normal.  Patient does not have elevation in CK.  Vital signs are normal.  I most suspect resolving viral syndrome.  Currently I feel patient stable for home management with Tylenol if needed and follow-up with PCP.  ED Discharge Orders    None       Arby BarrettePfeiffer, Meriem Lemieux, MD 06/23/18 2241

## 2018-06-23 NOTE — ED Notes (Addendum)
Pt is alert and orinted x 4 and is verbally responsive. Pt is c/o generalized pain 6/10 and reports leg and arm heaviness/ body aches, fatigue and weakness x 3-4 days. . Pt reports constant nausea.Ptis escorted with his mother at bedside.

## 2018-06-23 NOTE — ED Triage Notes (Signed)
Patient here from home with complaints of generalized body aches and fatigue. [facility], blood draw reported high WBC.

## 2018-06-25 IMAGING — MR MR HEAD WO/W CM
8 of 12 series · 32 of 48 positions shown · IV contrast (multihance)
Comparison: Head CTs without contrast 09/26/2017 and 09/15/2017.

CLINICAL DATA: 51-year-old male with chronic headaches, not
improved with conventional therapy.

Indwelling pelvic neurostimulator device, patient was scanned
according to manufacturer specifications with no adverse events
reported.
EXAM:
MRI HEAD WITHOUT AND WITH CONTRAST
TECHNIQUE: Multiplanar, multiecho pulse sequences of the brain and surrounding
structures were obtained without and with intravenous contrast.
CONTRAST:  20mL MULTIHANCE GADOBENATE DIMEGLUMINE 529 MG/ML IV SOLN

[Series 2: DWI · coronal · 5.0mm · 1.25mm/px · 5 of 56 slices shown (1 of 4)]
[im 1/56]
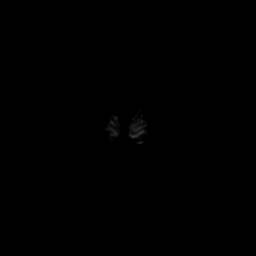
[im 14/56]
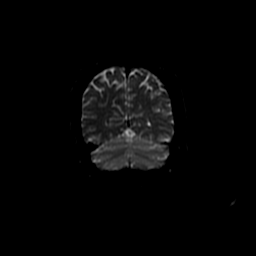
[im 28/56]
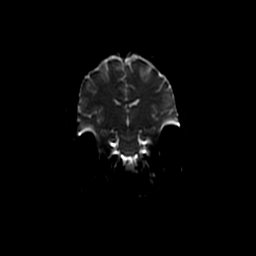
[im 42/56]
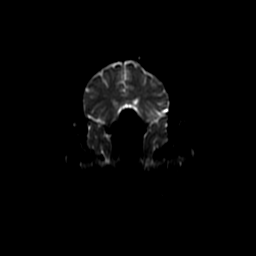
[im 56/56]
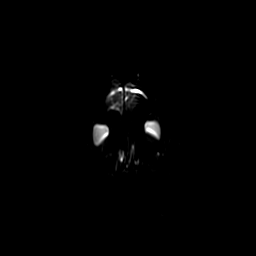

[Series 3: DWI · axial · 3.0mm · 1.25mm/px · z∈[-77,+59]mm · 8 of 80 slices shown (2 of 4)]
[im 1/80]
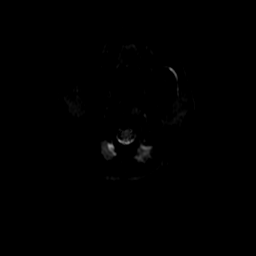
[im 12/80]
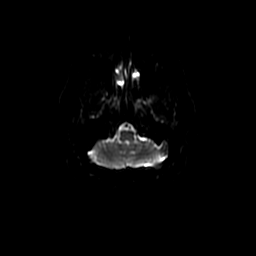
[im 23/80]
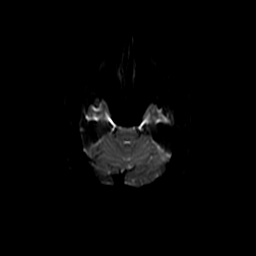
[im 34/80]
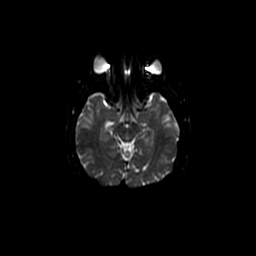
[im 46/80]
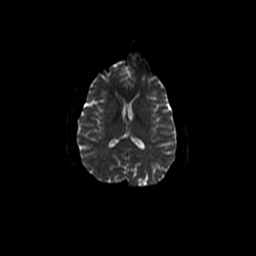
[im 57/80]
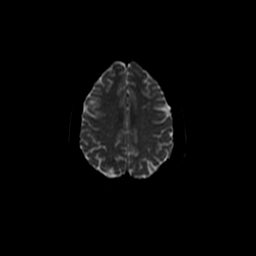
[im 68/80]
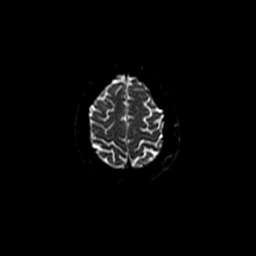
[im 80/80]
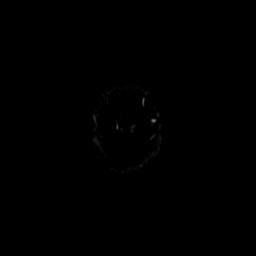

[Series 4: T2 · axial · 5.0mm · 0.43mm/px · z∈[-76,+56]mm · 3 of 23 slices shown (1 of 2)]
[im 1/23]
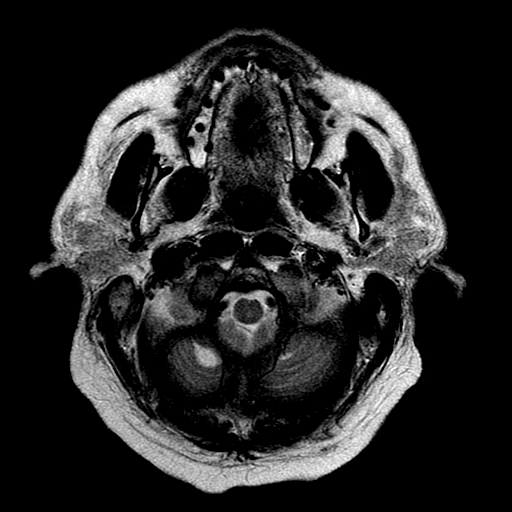
[im 12/23]
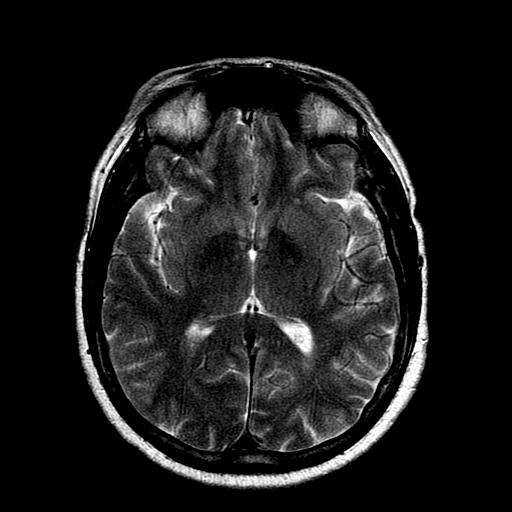
[im 23/23]
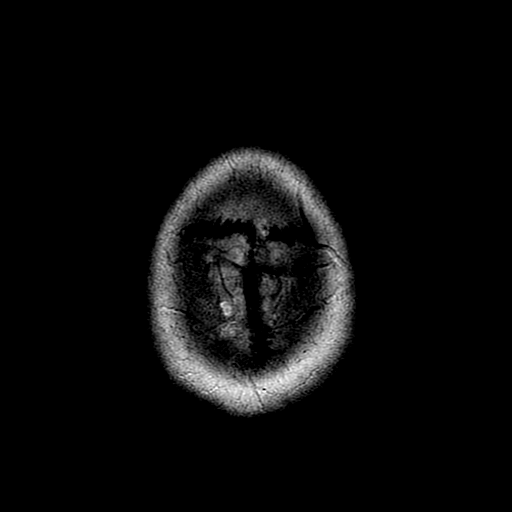

[Series 6: FLAIR · axial · 3.0mm · 0.43mm/px · z∈[-76,+56]mm · 3 of 23 slices shown]
[im 1/23]
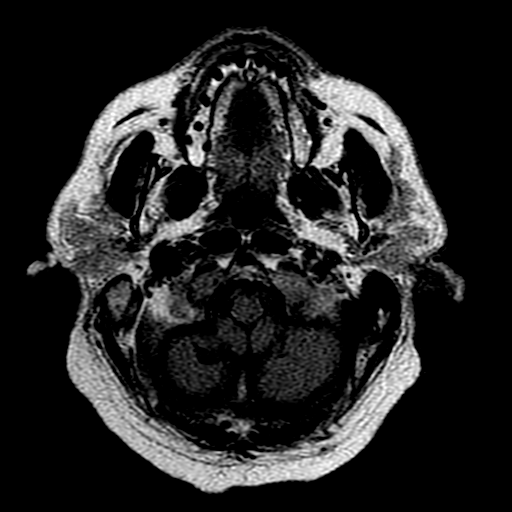
[im 12/23]
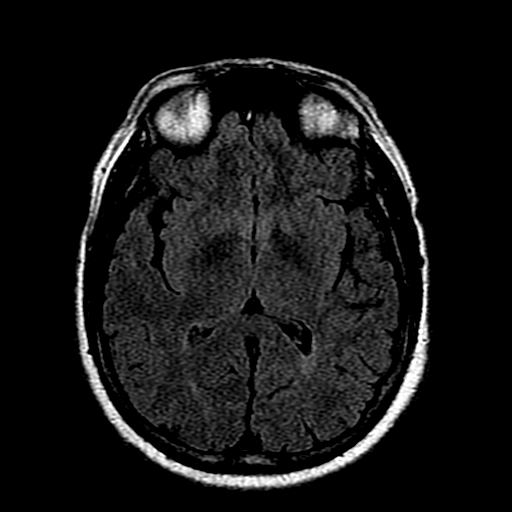
[im 23/23]
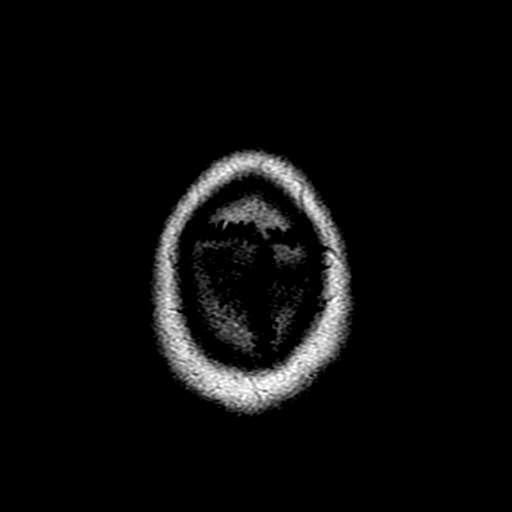

[Series 9: T2 · coronal · 5.0mm · 0.39mm/px · 3 of 26 slices shown (2 of 2)]
[im 1/26]
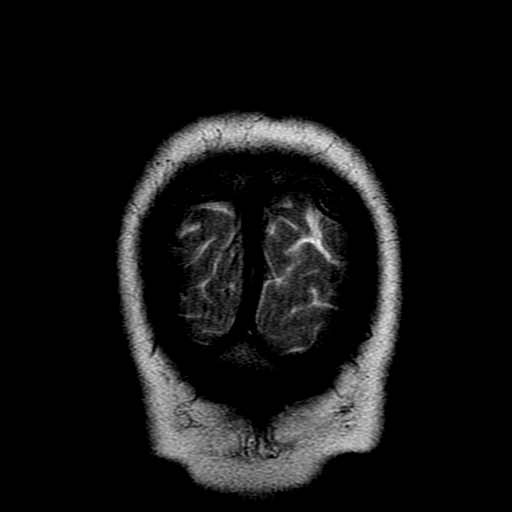
[im 13/26]
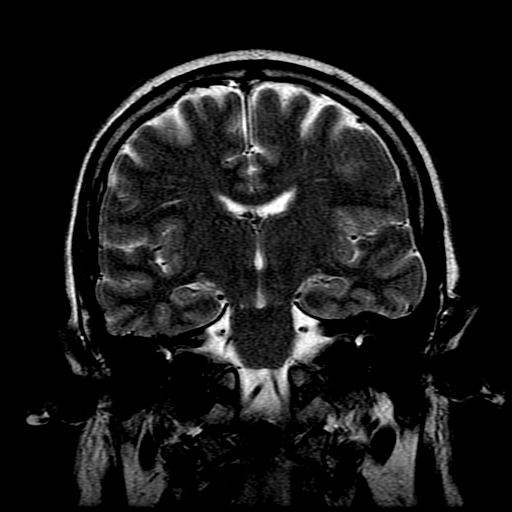
[im 26/26]
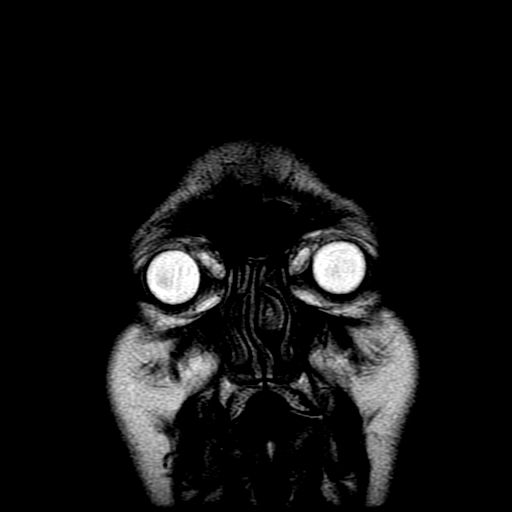

[Series 11: T1 post-contrast · coronal · 5.0mm · 0.39mm/px · 3 of 26 slices shown]
[im 1/26]
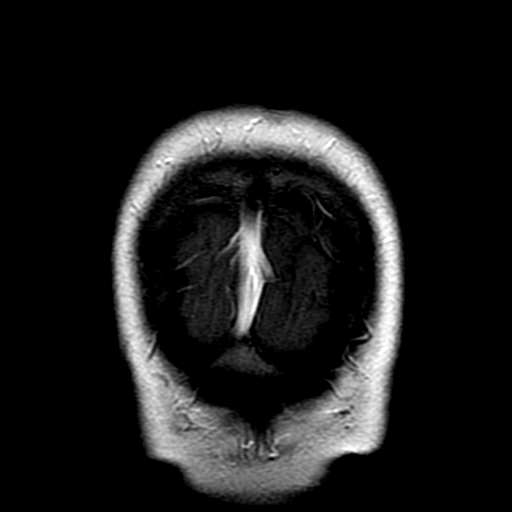
[im 13/26]
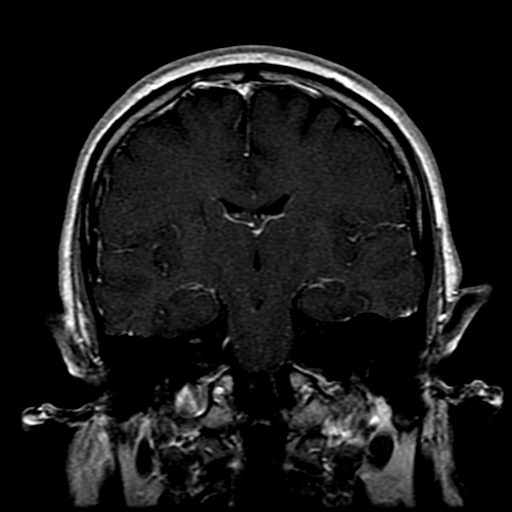
[im 26/26]
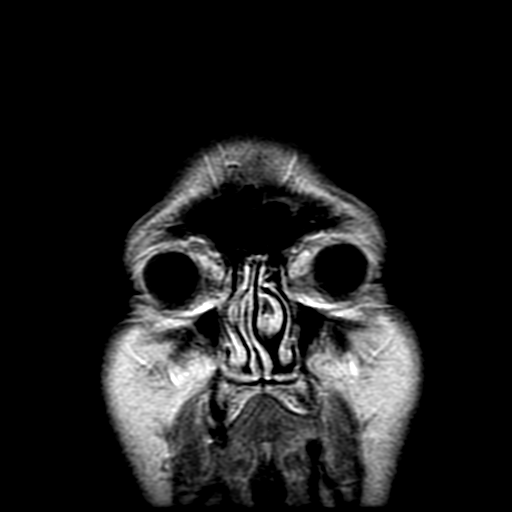

[Series 200: DWI · coronal · 5.0mm · 1.25mm/px · 3 of 28 slices shown (3 of 4)]
[im 1/28]
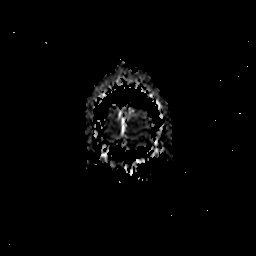
[im 14/28]
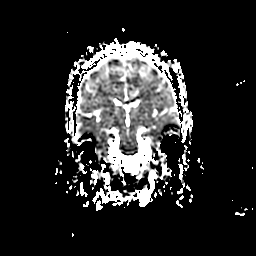
[im 28/28]
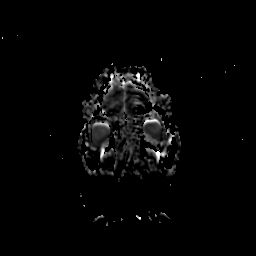

[Series 300: DWI · axial · 3.0mm · 1.25mm/px · z∈[-77,+59]mm · 4 of 40 slices shown (4 of 4)]
[im 1/40]
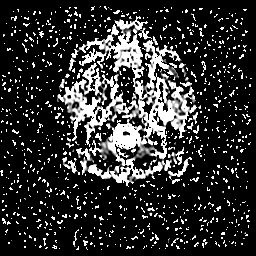
[im 14/40]
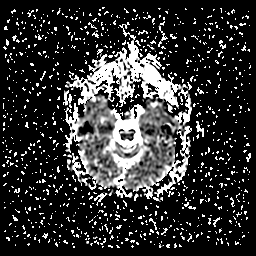
[im 27/40]
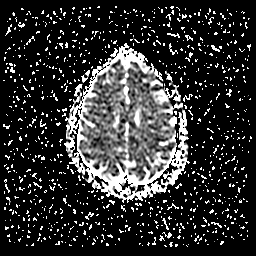
[im 40/40]
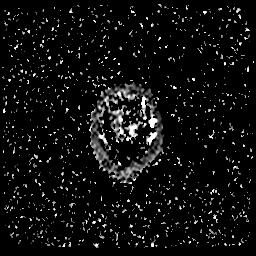

[32 of 48 positions shown; findings below may reference images not displayed]

Prior 6003/8553 Dorota Dariusz Wenta MRI images and report could
not be made available.
FINDINGS: Brain: Diffusion-weighted imaging is limited due to the manufacturer
recommended scan technique. No definite restricted diffusion is
evident on ADC maps.

Small area of chronic encephalomalacia in the right inferior
cerebellum (series 4, image 1 and series 9, image 8) unchanged from
the recent CTs. Possible subtle contralateral left inferior
cerebellar encephalomalacia also (same images). Normal for age gray
and white matter signal elsewhere throughout the brain. No cerebral
cortical encephalomalacia or chronic cerebral blood products
identified.

No midline shift, mass effect, evidence of mass lesion,
ventriculomegaly, extra-axial collection or acute intracranial
hemorrhage. Cervicomedullary junction and pituitary are within
normal limits.

No abnormal enhancement identified.  No dural thickening.

Vascular: Major intracranial vascular flow voids are preserved.

Skull and upper cervical spine: Grossly negative visualized upper
cervical spine. Normal skull bone marrow signal.

Sinuses/Orbits: Normal orbits soft tissues. Paranasal sinuses and
mastoids are stable and well pneumatized.

Other: Visible internal auditory structures appear normal. Scalp and
face soft tissues appear negative.
IMPRESSION: 1. Small areas of chronic encephalomalacia in the inferior
cerebellum, subtle on the left, are nonspecific but suggestive of
chronic PICA territory infarcts.
2. Otherwise normal for age MRI appearance of the brain.
3. Pelvic neurostimulator device. The patient was scanned according
to manufacturer specifications with no adverse events reported.

## 2018-07-16 DIAGNOSIS — M79675 Pain in left toe(s): Secondary | ICD-10-CM | POA: Diagnosis not present

## 2018-07-16 DIAGNOSIS — S93505A Unspecified sprain of left lesser toe(s), initial encounter: Secondary | ICD-10-CM | POA: Diagnosis not present

## 2018-07-20 DIAGNOSIS — L03032 Cellulitis of left toe: Secondary | ICD-10-CM | POA: Diagnosis not present

## 2018-08-02 DIAGNOSIS — G4733 Obstructive sleep apnea (adult) (pediatric): Secondary | ICD-10-CM | POA: Diagnosis not present

## 2018-08-19 DIAGNOSIS — J301 Allergic rhinitis due to pollen: Secondary | ICD-10-CM | POA: Diagnosis not present

## 2018-08-25 DIAGNOSIS — G4730 Sleep apnea, unspecified: Secondary | ICD-10-CM | POA: Diagnosis not present

## 2018-08-25 DIAGNOSIS — R7303 Prediabetes: Secondary | ICD-10-CM | POA: Diagnosis not present

## 2018-08-25 DIAGNOSIS — I10 Essential (primary) hypertension: Secondary | ICD-10-CM | POA: Diagnosis not present

## 2018-08-25 DIAGNOSIS — E782 Mixed hyperlipidemia: Secondary | ICD-10-CM | POA: Diagnosis not present

## 2018-08-25 DIAGNOSIS — M4716 Other spondylosis with myelopathy, lumbar region: Secondary | ICD-10-CM | POA: Diagnosis not present

## 2018-09-01 DIAGNOSIS — G4733 Obstructive sleep apnea (adult) (pediatric): Secondary | ICD-10-CM | POA: Diagnosis not present

## 2018-10-01 DIAGNOSIS — G4733 Obstructive sleep apnea (adult) (pediatric): Secondary | ICD-10-CM | POA: Diagnosis not present

## 2018-10-25 DIAGNOSIS — R110 Nausea: Secondary | ICD-10-CM | POA: Diagnosis not present

## 2018-10-25 DIAGNOSIS — K589 Irritable bowel syndrome without diarrhea: Secondary | ICD-10-CM | POA: Diagnosis not present

## 2018-10-25 DIAGNOSIS — K219 Gastro-esophageal reflux disease without esophagitis: Secondary | ICD-10-CM | POA: Diagnosis not present

## 2018-10-25 DIAGNOSIS — Z87891 Personal history of nicotine dependence: Secondary | ICD-10-CM | POA: Diagnosis not present

## 2018-10-25 DIAGNOSIS — Z23 Encounter for immunization: Secondary | ICD-10-CM | POA: Diagnosis not present

## 2018-10-25 DIAGNOSIS — Z79899 Other long term (current) drug therapy: Secondary | ICD-10-CM | POA: Diagnosis not present

## 2018-10-25 DIAGNOSIS — I10 Essential (primary) hypertension: Secondary | ICD-10-CM | POA: Diagnosis not present

## 2018-10-25 DIAGNOSIS — Z9682 Presence of neurostimulator: Secondary | ICD-10-CM | POA: Diagnosis not present

## 2018-10-25 DIAGNOSIS — S32039D Unspecified fracture of third lumbar vertebra, subsequent encounter for fracture with routine healing: Secondary | ICD-10-CM | POA: Diagnosis not present

## 2018-10-25 DIAGNOSIS — S32049D Unspecified fracture of fourth lumbar vertebra, subsequent encounter for fracture with routine healing: Secondary | ICD-10-CM | POA: Diagnosis not present

## 2018-10-25 DIAGNOSIS — G4730 Sleep apnea, unspecified: Secondary | ICD-10-CM | POA: Diagnosis not present

## 2018-11-02 DIAGNOSIS — G4733 Obstructive sleep apnea (adult) (pediatric): Secondary | ICD-10-CM | POA: Diagnosis not present

## 2018-11-11 DIAGNOSIS — G4730 Sleep apnea, unspecified: Secondary | ICD-10-CM | POA: Diagnosis not present

## 2018-11-11 DIAGNOSIS — J018 Other acute sinusitis: Secondary | ICD-10-CM | POA: Diagnosis not present

## 2018-11-29 DIAGNOSIS — E782 Mixed hyperlipidemia: Secondary | ICD-10-CM | POA: Diagnosis not present

## 2018-11-29 DIAGNOSIS — I10 Essential (primary) hypertension: Secondary | ICD-10-CM | POA: Diagnosis not present

## 2018-11-29 DIAGNOSIS — M4716 Other spondylosis with myelopathy, lumbar region: Secondary | ICD-10-CM | POA: Diagnosis not present

## 2018-11-29 DIAGNOSIS — R7303 Prediabetes: Secondary | ICD-10-CM | POA: Diagnosis not present

## 2018-12-02 DIAGNOSIS — G4733 Obstructive sleep apnea (adult) (pediatric): Secondary | ICD-10-CM | POA: Diagnosis not present

## 2018-12-06 DIAGNOSIS — R05 Cough: Secondary | ICD-10-CM | POA: Diagnosis not present

## 2018-12-06 DIAGNOSIS — R509 Fever, unspecified: Secondary | ICD-10-CM | POA: Diagnosis not present

## 2018-12-06 DIAGNOSIS — J209 Acute bronchitis, unspecified: Secondary | ICD-10-CM | POA: Diagnosis not present

## 2018-12-06 DIAGNOSIS — N189 Chronic kidney disease, unspecified: Secondary | ICD-10-CM | POA: Diagnosis not present

## 2018-12-06 DIAGNOSIS — I129 Hypertensive chronic kidney disease with stage 1 through stage 4 chronic kidney disease, or unspecified chronic kidney disease: Secondary | ICD-10-CM | POA: Diagnosis not present

## 2018-12-14 DIAGNOSIS — N3281 Overactive bladder: Secondary | ICD-10-CM | POA: Diagnosis not present

## 2018-12-15 DIAGNOSIS — J189 Pneumonia, unspecified organism: Secondary | ICD-10-CM | POA: Diagnosis not present

## 2018-12-15 DIAGNOSIS — J180 Bronchopneumonia, unspecified organism: Secondary | ICD-10-CM | POA: Diagnosis not present

## 2019-01-03 DIAGNOSIS — G4733 Obstructive sleep apnea (adult) (pediatric): Secondary | ICD-10-CM | POA: Diagnosis not present

## 2019-02-02 DIAGNOSIS — G4733 Obstructive sleep apnea (adult) (pediatric): Secondary | ICD-10-CM | POA: Diagnosis not present

## 2019-02-04 DIAGNOSIS — T85193A Other mechanical complication of implanted electronic neurostimulator, generator, initial encounter: Secondary | ICD-10-CM | POA: Diagnosis not present

## 2019-02-17 DIAGNOSIS — R350 Frequency of micturition: Secondary | ICD-10-CM | POA: Diagnosis not present

## 2019-03-03 DIAGNOSIS — E782 Mixed hyperlipidemia: Secondary | ICD-10-CM | POA: Diagnosis not present

## 2019-03-03 DIAGNOSIS — G4730 Sleep apnea, unspecified: Secondary | ICD-10-CM | POA: Diagnosis not present

## 2019-03-03 DIAGNOSIS — I10 Essential (primary) hypertension: Secondary | ICD-10-CM | POA: Diagnosis not present

## 2019-03-03 DIAGNOSIS — G90521 Complex regional pain syndrome I of right lower limb: Secondary | ICD-10-CM | POA: Diagnosis not present

## 2019-03-04 DIAGNOSIS — G4733 Obstructive sleep apnea (adult) (pediatric): Secondary | ICD-10-CM | POA: Diagnosis not present

## 2019-03-28 DIAGNOSIS — Z87891 Personal history of nicotine dependence: Secondary | ICD-10-CM | POA: Diagnosis not present

## 2019-03-28 DIAGNOSIS — L03115 Cellulitis of right lower limb: Secondary | ICD-10-CM | POA: Diagnosis not present

## 2019-03-28 DIAGNOSIS — K589 Irritable bowel syndrome without diarrhea: Secondary | ICD-10-CM | POA: Diagnosis not present

## 2019-03-28 DIAGNOSIS — K219 Gastro-esophageal reflux disease without esophagitis: Secondary | ICD-10-CM | POA: Diagnosis not present

## 2019-03-28 DIAGNOSIS — G4730 Sleep apnea, unspecified: Secondary | ICD-10-CM | POA: Diagnosis not present

## 2019-03-28 DIAGNOSIS — I10 Essential (primary) hypertension: Secondary | ICD-10-CM | POA: Diagnosis not present

## 2019-03-28 DIAGNOSIS — Z79899 Other long term (current) drug therapy: Secondary | ICD-10-CM | POA: Diagnosis not present

## 2019-03-28 DIAGNOSIS — R52 Pain, unspecified: Secondary | ICD-10-CM | POA: Diagnosis not present

## 2019-03-28 DIAGNOSIS — Z23 Encounter for immunization: Secondary | ICD-10-CM | POA: Diagnosis not present

## 2019-04-03 DIAGNOSIS — G4733 Obstructive sleep apnea (adult) (pediatric): Secondary | ICD-10-CM | POA: Diagnosis not present

## 2019-04-08 DIAGNOSIS — N3281 Overactive bladder: Secondary | ICD-10-CM | POA: Diagnosis not present

## 2019-05-09 DIAGNOSIS — L03113 Cellulitis of right upper limb: Secondary | ICD-10-CM | POA: Diagnosis not present

## 2019-05-24 DIAGNOSIS — Z1159 Encounter for screening for other viral diseases: Secondary | ICD-10-CM | POA: Diagnosis not present

## 2019-05-24 DIAGNOSIS — Z0000 Encounter for general adult medical examination without abnormal findings: Secondary | ICD-10-CM | POA: Diagnosis not present

## 2019-06-16 DIAGNOSIS — G4733 Obstructive sleep apnea (adult) (pediatric): Secondary | ICD-10-CM | POA: Diagnosis not present

## 2019-07-11 DIAGNOSIS — Z87891 Personal history of nicotine dependence: Secondary | ICD-10-CM | POA: Diagnosis not present

## 2019-07-11 DIAGNOSIS — K219 Gastro-esophageal reflux disease without esophagitis: Secondary | ICD-10-CM | POA: Diagnosis not present

## 2019-07-11 DIAGNOSIS — I10 Essential (primary) hypertension: Secondary | ICD-10-CM | POA: Diagnosis not present

## 2019-07-11 DIAGNOSIS — Z9682 Presence of neurostimulator: Secondary | ICD-10-CM | POA: Diagnosis not present

## 2019-07-11 DIAGNOSIS — K589 Irritable bowel syndrome without diarrhea: Secondary | ICD-10-CM | POA: Diagnosis not present

## 2019-07-11 DIAGNOSIS — G479 Sleep disorder, unspecified: Secondary | ICD-10-CM | POA: Diagnosis not present

## 2019-07-11 DIAGNOSIS — Z8781 Personal history of (healed) traumatic fracture: Secondary | ICD-10-CM | POA: Diagnosis not present

## 2019-07-11 DIAGNOSIS — R52 Pain, unspecified: Secondary | ICD-10-CM | POA: Diagnosis not present

## 2019-07-11 DIAGNOSIS — T50905A Adverse effect of unspecified drugs, medicaments and biological substances, initial encounter: Secondary | ICD-10-CM | POA: Diagnosis not present

## 2019-07-14 DIAGNOSIS — H524 Presbyopia: Secondary | ICD-10-CM | POA: Diagnosis not present

## 2019-07-26 DIAGNOSIS — M75112 Incomplete rotator cuff tear or rupture of left shoulder, not specified as traumatic: Secondary | ICD-10-CM | POA: Diagnosis not present

## 2019-08-03 DIAGNOSIS — R7303 Prediabetes: Secondary | ICD-10-CM | POA: Diagnosis not present

## 2019-08-03 DIAGNOSIS — G4730 Sleep apnea, unspecified: Secondary | ICD-10-CM | POA: Diagnosis not present

## 2019-08-03 DIAGNOSIS — E782 Mixed hyperlipidemia: Secondary | ICD-10-CM | POA: Diagnosis not present

## 2019-08-03 DIAGNOSIS — I10 Essential (primary) hypertension: Secondary | ICD-10-CM | POA: Diagnosis not present

## 2019-09-19 DIAGNOSIS — M25512 Pain in left shoulder: Secondary | ICD-10-CM | POA: Diagnosis not present

## 2019-09-26 DIAGNOSIS — M25512 Pain in left shoulder: Secondary | ICD-10-CM | POA: Diagnosis not present

## 2019-09-26 DIAGNOSIS — M6281 Muscle weakness (generalized): Secondary | ICD-10-CM | POA: Diagnosis not present

## 2019-09-26 DIAGNOSIS — M25612 Stiffness of left shoulder, not elsewhere classified: Secondary | ICD-10-CM | POA: Diagnosis not present

## 2019-09-29 DIAGNOSIS — M25612 Stiffness of left shoulder, not elsewhere classified: Secondary | ICD-10-CM | POA: Diagnosis not present

## 2019-09-29 DIAGNOSIS — M25512 Pain in left shoulder: Secondary | ICD-10-CM | POA: Diagnosis not present

## 2019-09-29 DIAGNOSIS — M6281 Muscle weakness (generalized): Secondary | ICD-10-CM | POA: Diagnosis not present

## 2019-10-04 DIAGNOSIS — M6281 Muscle weakness (generalized): Secondary | ICD-10-CM | POA: Diagnosis not present

## 2019-10-04 DIAGNOSIS — M25612 Stiffness of left shoulder, not elsewhere classified: Secondary | ICD-10-CM | POA: Diagnosis not present

## 2019-10-04 DIAGNOSIS — M25512 Pain in left shoulder: Secondary | ICD-10-CM | POA: Diagnosis not present

## 2019-10-10 DIAGNOSIS — K219 Gastro-esophageal reflux disease without esophagitis: Secondary | ICD-10-CM | POA: Diagnosis not present

## 2019-10-10 DIAGNOSIS — I10 Essential (primary) hypertension: Secondary | ICD-10-CM | POA: Diagnosis not present

## 2019-10-10 DIAGNOSIS — G4730 Sleep apnea, unspecified: Secondary | ICD-10-CM | POA: Diagnosis not present

## 2019-10-10 DIAGNOSIS — Z79899 Other long term (current) drug therapy: Secondary | ICD-10-CM | POA: Diagnosis not present

## 2019-10-10 DIAGNOSIS — Z9682 Presence of neurostimulator: Secondary | ICD-10-CM | POA: Diagnosis not present

## 2019-10-10 DIAGNOSIS — S32009D Unspecified fracture of unspecified lumbar vertebra, subsequent encounter for fracture with routine healing: Secondary | ICD-10-CM | POA: Diagnosis not present

## 2019-10-10 DIAGNOSIS — Z87891 Personal history of nicotine dependence: Secondary | ICD-10-CM | POA: Diagnosis not present

## 2019-10-11 DIAGNOSIS — M25512 Pain in left shoulder: Secondary | ICD-10-CM | POA: Diagnosis not present

## 2019-10-11 DIAGNOSIS — M25612 Stiffness of left shoulder, not elsewhere classified: Secondary | ICD-10-CM | POA: Diagnosis not present

## 2019-10-11 DIAGNOSIS — M6281 Muscle weakness (generalized): Secondary | ICD-10-CM | POA: Diagnosis not present

## 2019-10-13 DIAGNOSIS — M25512 Pain in left shoulder: Secondary | ICD-10-CM | POA: Diagnosis not present

## 2019-10-13 DIAGNOSIS — M25612 Stiffness of left shoulder, not elsewhere classified: Secondary | ICD-10-CM | POA: Diagnosis not present

## 2019-10-13 DIAGNOSIS — M6281 Muscle weakness (generalized): Secondary | ICD-10-CM | POA: Diagnosis not present

## 2019-10-25 DIAGNOSIS — M25512 Pain in left shoulder: Secondary | ICD-10-CM | POA: Diagnosis not present

## 2019-10-25 DIAGNOSIS — M25612 Stiffness of left shoulder, not elsewhere classified: Secondary | ICD-10-CM | POA: Diagnosis not present

## 2019-10-25 DIAGNOSIS — M6281 Muscle weakness (generalized): Secondary | ICD-10-CM | POA: Diagnosis not present

## 2019-10-27 DIAGNOSIS — M25612 Stiffness of left shoulder, not elsewhere classified: Secondary | ICD-10-CM | POA: Diagnosis not present

## 2019-10-27 DIAGNOSIS — M6281 Muscle weakness (generalized): Secondary | ICD-10-CM | POA: Diagnosis not present

## 2019-10-27 DIAGNOSIS — M25512 Pain in left shoulder: Secondary | ICD-10-CM | POA: Diagnosis not present

## 2019-11-01 DIAGNOSIS — M25612 Stiffness of left shoulder, not elsewhere classified: Secondary | ICD-10-CM | POA: Diagnosis not present

## 2019-11-01 DIAGNOSIS — M6281 Muscle weakness (generalized): Secondary | ICD-10-CM | POA: Diagnosis not present

## 2019-11-01 DIAGNOSIS — M25512 Pain in left shoulder: Secondary | ICD-10-CM | POA: Diagnosis not present

## 2019-11-08 DIAGNOSIS — J0100 Acute maxillary sinusitis, unspecified: Secondary | ICD-10-CM | POA: Diagnosis not present

## 2019-11-08 DIAGNOSIS — Z1159 Encounter for screening for other viral diseases: Secondary | ICD-10-CM | POA: Diagnosis not present

## 2020-01-05 ENCOUNTER — Other Ambulatory Visit: Payer: Self-pay

## 2020-01-05 MED ORDER — OXYCODONE HCL 15 MG PO TABS: 15.0000 mg | ORAL_TABLET | Freq: Four times a day (QID) | ORAL | 0 refills | Status: DC | PRN

## 2020-02-06 ENCOUNTER — Other Ambulatory Visit: Payer: Self-pay

## 2020-02-06 MED ORDER — OXYCODONE HCL 15 MG PO TABS: 15.0000 mg | ORAL_TABLET | Freq: Four times a day (QID) | ORAL | 0 refills | Status: DC | PRN

## 2020-02-14 ENCOUNTER — Telehealth: Payer: Self-pay

## 2020-02-14 NOTE — Telephone Encounter (Signed)
[facility] sent a fax with the approved of esomeprazole for quantity limits from 11/25/2019 to 11/23/2020. I called patient to let him know and also I wanted to asking if he is taking omeprazole. I left message asking to call us back.

## 2020-02-16 HISTORY — PX: MOUTH SURGERY: SHX715

## 2020-03-05 ENCOUNTER — Other Ambulatory Visit: Payer: Self-pay

## 2020-03-05 MED ORDER — OXYCODONE HCL 15 MG PO TABS: 15.0000 mg | ORAL_TABLET | Freq: Four times a day (QID) | ORAL | 0 refills | Status: DC | PRN

## 2020-03-07 ENCOUNTER — Ambulatory Visit: Payer: Medicare Other | Admitting: Legal Medicine

## 2020-03-14 ENCOUNTER — Ambulatory Visit (INDEPENDENT_AMBULATORY_CARE_PROVIDER_SITE_OTHER): Payer: Medicare Other | Admitting: Legal Medicine

## 2020-03-14 ENCOUNTER — Encounter: Payer: Self-pay | Admitting: Legal Medicine

## 2020-03-14 ENCOUNTER — Other Ambulatory Visit: Payer: Self-pay

## 2020-03-14 VITALS — BP 110/70 | HR 74 | Temp 98.2°F | Resp 17 | Ht 70.0 in | Wt 193.0 lb

## 2020-03-14 DIAGNOSIS — E782 Mixed hyperlipidemia: Secondary | ICD-10-CM

## 2020-03-14 DIAGNOSIS — M4716 Other spondylosis with myelopathy, lumbar region: Secondary | ICD-10-CM

## 2020-03-14 DIAGNOSIS — Z79891 Long term (current) use of opiate analgesic: Secondary | ICD-10-CM

## 2020-03-14 DIAGNOSIS — K5909 Other constipation: Secondary | ICD-10-CM

## 2020-03-14 DIAGNOSIS — K219 Gastro-esophageal reflux disease without esophagitis: Secondary | ICD-10-CM | POA: Insufficient documentation

## 2020-03-14 DIAGNOSIS — I10 Essential (primary) hypertension: Secondary | ICD-10-CM

## 2020-03-14 DIAGNOSIS — K2100 Gastro-esophageal reflux disease with esophagitis, without bleeding: Secondary | ICD-10-CM

## 2020-03-14 HISTORY — DX: Long term (current) use of opiate analgesic: Z79.891

## 2020-03-14 HISTORY — DX: Other spondylosis with myelopathy, lumbar region: M47.16

## 2020-03-14 NOTE — Assessment & Plan Note (Addendum)
Plan of care was formulated today.  She is doing well.  A plan of care was formulated using patient exam, tests and other sources to optimize care using evidence based information.  Recommend no smoking, no eating after supper, avoid fatty foods, elevate Head of bed, avoid tight fitting clothing.  Continue on nexium. 

## 2020-03-14 NOTE — Assessment & Plan Note (Signed)
AN INDIVIDUAL CARE PLAN was established and reinforced today.  The patient's status was assessed using clinical findings on exam, labs, and other diagnostic testing. Patient's success at meeting treatment goals based on disease specific evidence-bassed guidelines and found to be in good control. RECOMMENDATIONS include maintaininbg present medicines and treatment.

## 2020-03-14 NOTE — Progress Notes (Signed)
Established Patient Office Visit  Subjective:  Patient ID: Kevin Pena, male    DOB: Mar 15, 1966  Age: 54 y.o. MRN: [id number]  CC:  Chief Complaint  Patient presents with  . Gastroesophageal Reflux  . Hyperlipidemia  . Hypertension    HPI Kevin Pena presents for Chronic visit.  Patient has gastroesophageal reflux symptoms withesophagitis and LTRD.  The symptoms are moderate intensity.  Length of symptoms 20 years.  Medicines include nexium.  Complications include none.  Patient presents for follow up of hypertension.  Patient tolerating HCTZ well with side effects.  Patient was diagnosed with hypertension 2010 so has been treated for hypertension for 10 years.Patient is working on maintaining diet and exercise regimen and follows up as directed. Complication include none.  Patient presents with hyperlipidemia.  Compliance with treatment has been good; patient takes medicines as directed, maintains low cholesterol diet, follows up as directed, and maintains exercise regimen.  Patient is using Atorvastatin without problems.  Past Medical History:  Diagnosis Date  . Arthritis   . Frequency of urination   . GERD (gastroesophageal reflux disease)   . 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 HIV positive (HCC) 1986  . Hyperlipidemia   . Hypertension   . Internal hemorrhoids   . Long-term current use of opiate analgesic 03/14/2020  . PONV (postoperative nausea and vomiting)   . Rash left leg-- using topical cream  . Spondylosis with myelopathy, 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  . Urge urinary incontinence     Past Surgical History:  Procedure Laterality Date  . APPENDECTOMY  1981  . CHOLECYSTECTOMY  2006  . FOOT SURGERY    . HAMMER TOE SURGERY  OCT 2010  . INTERSTIM IMPLANT PLACEMENT  06/17/2012   Procedure: Leane Platt IMPLANT FIRST STAGE;  Surgeon: Martina Sinner, MD;  Location: [facility];  Service: Urology;  Laterality: N/A;  RAD TECH OK PER VICKIE AT  MAIN OR   . INTERSTIM IMPLANT PLACEMENT  06/17/2012   Procedure: Leane Platt IMPLANT SECOND STAGE;  Surgeon: Martina Sinner, MD;  Location[facility];  Service: Urology;  Laterality: N/A;  . MOUTH SURGERY  02/16/2020  . REPAIR RECURRENT RIGHT INGUINAL HERNIA  2000  . RIGHT INGUINAL HERNIA REPAIR  1997  . UMBILICAL HERNIA REPAIR  2010    Family History  Problem Relation Age of Onset  . Other Mother        colon resection, tumor near liver  . Diabetes Mother   . Hyperlipidemia Mother   . Hypertension Mother   . Varicose Veins Mother   . Heart Problems Father   . Diabetes Father   . Heart disease Father   . Hyperlipidemia Father   . Hypertension Father   . Pancreatic cancer Neg Hx   . Rectal cancer Neg Hx   . Stomach cancer Neg Hx   . Colon cancer Neg Hx     Social History   Socioeconomic History  . Marital status: Significant Other    Spouse name: [name]  . Number of children: 1  . Years of education: College  . Highest education level: Not on file  Occupational History    Employer: OTHER    Comment: disability  Tobacco Use  . Smoking status: Former Smoker    Packs/day: 1.00    Years: 30.00    Pack years: 30.00    Types: Cigarettes    Quit date: 06/11/2010    Years since quitting: 9.7  . Smokeless tobacco: Never  Used  Substance and Sexual Activity  . Alcohol use: Yes    Alcohol/week: 2.0 standard drinks    Types: 2 Cans of beer per week    Comment: rarely  . Drug use: No  . Sexual activity: Not Currently    Partners: Male  Other Topics Concern  . Not on file  Social History Narrative   Patient lives at home with partner.   Caffeine Use: 32oz daily coffee/tea   Social Determinants of Corporate investment banker Strain:   . Difficulty of Paying Living Expenses:   Food Insecurity:   . Worried About Programme researcher, broadcasting/film/video in the Last Year:   . Barista in the Last Year:   Transportation Needs:   . Freight forwarder  (Medical):   Marland Kitchen Lack of Transportation (Non-Medical):   Physical Activity:   . Days of Exercise per Week:   . Minutes of Exercise per Session:   Stress:   . Feeling of Stress :   Social Connections:   . Frequency of Communication with Friends and Family:   . Frequency of Social Gatherings with Friends and Family:   . Attends Religious Services:   . Active Member of Clubs or Organizations:   . Attends Banker Meetings:   Marland Kitchen Marital Status:   Intimate Partner Violence:   . Fear of Current or Ex-Partner:   . Emotionally Abused:   Marland Kitchen Physically Abused:   . Sexually Abused:     Outpatient Medications Prior to Visit  Medication Sig Dispense Refill  . acetaminophen (TYLENOL) 500 MG tablet Take by mouth.    Marland Kitchen atorvastatin (LIPITOR) 20 MG tablet Take 1 tablet by mouth at bedtime.   2  . diazepam (VALIUM) 5 MG tablet Take 10 mg by mouth at bedtime. scheduled    . esomeprazole (NEXIUM) 40 MG capsule Take 40 mg by mouth at bedtime.   3  . fluticasone (FLONASE) 50 MCG/ACT nasal spray SPRAY 1 SPRAY INTO EACH NOSTRIL EVERY DAY AS NEEDED FOR ALLERGIES  6  . hydrochlorothiazide (HYDRODIURIL) 25 MG tablet Take 25 mg by mouth every evening.     . ODEFSEY 200-25-25 MG TABS tablet Take 1 tablet by mouth every morning.    Marland Kitchen oxyCODONE (ROXICODONE) 15 MG immediate release tablet Take 1 tablet (15 mg total) by mouth every 6 (six) hours as needed for pain. 120 tablet 0  . PROAIR HFA 108 (90 BASE) MCG/ACT inhaler Inhale 2 puffs into the lungs every 6 (six) hours as needed for wheezing or shortness of breath.   11  . valACYclovir (VALTREX) 500 MG tablet Take 1 tablet by mouth daily as needed (outbreaks).     Marland Kitchen amitriptyline (ELAVIL) 25 MG tablet Take 1 tablet (25 mg total) by mouth at bedtime. (Patient not taking: Reported on 05/26/2018) 90 tablet 2  . efavirenz-emtrictabine-tenofovir (ATRIPLA) 600-200-300 MG per tablet Take 1 tablet by mouth at bedtime.    Marland Kitchen omeprazole-sodium bicarbonate (ZEGERID)  40-1100 MG per capsule Take 1 capsule by mouth daily before breakfast. (Patient not taking: Reported on 06/23/2018) 90 capsule 3  . QVAR 40 MCG/ACT inhaler Inhale 1 puff into the lungs daily as needed (wheezing and sob).   6   No facility-administered medications prior to visit.    Allergies  Allergen Reactions  . Adhesive [Tape] Other (See Comments)    SEVERE IRRITAION  . Ampicillin Nausea And Vomiting  . Doxycycline Rash  . Lyrica [Pregabalin] Nausea And Vomiting and Rash  ROS Review of Systems  Constitutional: Negative.   HENT: Negative.   Eyes: Negative.   Respiratory: Negative.   Cardiovascular: Negative.   Gastrointestinal: Negative.   Endocrine: Negative.   Genitourinary: Negative.   Musculoskeletal: Negative.   Skin: Negative.   Neurological: Negative.   Psychiatric/Behavioral: Negative.       Objective:    Physical Exam  Constitutional: He is oriented to person, place, and time. He appears well-developed and well-nourished.  HENT:  Head: Normocephalic and atraumatic.  Right Ear: External ear normal.  Left Ear: External ear normal.  Nose: Nose normal.  Mouth/Throat: Oropharynx is clear and moist.  Eyes: Pupils are equal, round, and reactive to light. Conjunctivae and EOM are normal.  Cardiovascular: Normal rate, regular rhythm, normal heart sounds and intact distal pulses.  Pulmonary/Chest: Effort normal and breath sounds normal.  Abdominal: Soft. Bowel sounds are normal.  Musculoskeletal:        General: Normal range of motion.     Cervical back: Normal range of motion and neck supple.  Neurological: He is alert and oriented to person, place, and time. He has normal reflexes.  Skin: Skin is warm and dry.  Psychiatric: He has a normal mood and affect. His behavior is normal. Judgment and thought content normal.  Vitals reviewed.   BP 110/70 (BP Location: Left Arm, Patient Position: Sitting)   Pulse 74   Temp 98.2 F (36.8 C) (Temporal)   Resp 17    Ht 5\' 10"  (1.778 m)   Wt 193 lb (87.5 kg)   BMI 27.69 kg/m  Wt Readings from Last 3 Encounters:  03/14/20 193 lb (87.5 kg)  06/23/18 190 lb (86.2 kg)  05/26/18 190 lb 6.4 oz (86.4 kg)     Health Maintenance Due  Topic Date Due  . Hepatitis C Screening  Never done    There are no preventive care reminders to display for this patient.  No results found for: TSH Lab Results  Component Value Date   WBC 9.2 06/23/2018   HGB 16.0 06/23/2018   HCT 46.2 06/23/2018   MCV 95.3 06/23/2018   PLT 206 06/23/2018   Lab Results  Component Value Date   NA 143 06/23/2018   K 4.7 06/23/2018   CO2 25 06/23/2018   GLUCOSE 92 06/23/2018   BUN 17 06/23/2018   CREATININE 1.08 06/23/2018   BILITOT 0.4 06/23/2018   ALKPHOS 76 06/23/2018   AST 26 06/23/2018   ALT 28 06/23/2018   PROT 7.0 06/23/2018   ALBUMIN 4.1 06/23/2018   CALCIUM 9.5 06/23/2018   ANIONGAP 8 06/23/2018   No results found for: CHOL No results found for: HDL No results found for: LDLCALC No results found for: TRIG No results found for: CHOLHDL No results found for: 06/25/2018    Assessment & Plan:   Problem List Items Addressed This Visit      Digestive   GERD (gastroesophageal reflux disease)    Plan of care was formulated today.  She is doing well.  A plan of care was formulated using patient exam, tests and other sources to optimize care using evidence based information.  Recommend no smoking, no eating after supper, avoid fatty foods, elevate Head of bed, avoid tight fitting clothing.  Continue on nexium.        Nervous and Auditory   Spondylosis with myelopathy, lumbar region    AN INDIVIDUAL CARE PLAN was established and reinforced today.  The patient's status was assessed using clinical findings on exam,  labs, and other diagnostic testing. Patient's success at meeting treatment goals based on disease specific evidence-bassed guidelines and found to be in good control. RECOMMENDATIONS include maintaininbg  present medicines and treatment.      Relevant Medications   acetaminophen (TYLENOL) 500 MG tablet     Other   Mixed hyperlipidemia - Primary    AN INDIVIDUAL CARE PLAN for hyperlipidemia/ cholesterol was established and reinforced today.  The patient's status was assessed using clinical findings on exam, lab and other diagnostic tests. The patient's disease status was assessed based on evidence-based guidelines and found to be well controlled. MEDICATIONS were reviewed. SELF MANAGEMENT GOALS have been discussed and patient's success at attaining the goal of low cholesterol was assessed. RECOMMENDATION given include regular exercise 3 days a week and low cholesterol/low fat diet. CLINICAL SUMMARY including written plan to identify barriers unique to the patient due to social or economic  reasons was discussed.      Relevant Orders   Lipid Panel   Long-term current use of opiate analgesic    AN INDIVIDUAL CARE PLAN was established and reinforced today.  The patient's status was assessed using clinical findings on exam, labs, and other diagnostic testing. Patient's success at meeting treatment goals based on disease specific evidence-bassed guidelines and found to be in fair control. RECOMMENDATIONS include maintining present medicines and treatment. He is on chronicoxycodone with no abuse.  Negative REMS. I coun ted his medicines left.       Other Visit Diagnoses    Essential hypertension, benign       Relevant Orders   CBC with Differential   Comprehensive metabolic panel   Chronic constipation          No orders of the defined types were placed in this encounter.   Follow-up: Return in about 3 months (around 06/13/2020) for fasting.    Reinaldo Meeker, MD

## 2020-03-14 NOTE — Patient Instructions (Signed)
Use linzess one pill a day

## 2020-03-14 NOTE — Assessment & Plan Note (Signed)
AN INDIVIDUAL CARE PLAN was established and reinforced today.  The patient's status was assessed using clinical findings on exam, labs, and other diagnostic testing. Patient's success at meeting treatment goals based on disease specific evidence-bassed guidelines and found to be in fair control. RECOMMENDATIONS include maintining present medicines and treatment. He is on chronicoxycodone with no abuse.  Negative REMS. I coun ted his medicines left.

## 2020-03-14 NOTE — Assessment & Plan Note (Signed)

## 2020-03-15 LAB — CBC WITH DIFFERENTIAL/PLATELET
Basophils Absolute: 0 10*3/uL (ref 0.0–0.2)
Basos: 1 %
EOS (ABSOLUTE): 0.1 10*3/uL (ref 0.0–0.4)
Eos: 2 %
Hematocrit: 42.5 % (ref 37.5–51.0)
Hemoglobin: 14.6 g/dL (ref 13.0–17.7)
Immature Grans (Abs): 0 10*3/uL (ref 0.0–0.1)
Immature Granulocytes: 0 %
Lymphocytes Absolute: 1.6 10*3/uL (ref 0.7–3.1)
Lymphs: 22 %
MCH: 32.9 pg (ref 26.6–33.0)
MCHC: 34.4 g/dL (ref 31.5–35.7)
MCV: 96 fL (ref 79–97)
Monocytes Absolute: 1 10*3/uL — ABNORMAL HIGH (ref 0.1–0.9)
Monocytes: 14 %
Neutrophils Absolute: 4.3 10*3/uL (ref 1.4–7.0)
Neutrophils: 61 %
Platelets: 207 10*3/uL (ref 150–450)
RBC: 4.44 x10E6/uL (ref 4.14–5.80)
RDW: 11.9 % (ref 11.6–15.4)
WBC: 6.9 10*3/uL (ref 3.4–10.8)

## 2020-03-15 LAB — COMPREHENSIVE METABOLIC PANEL
ALT: 14 IU/L (ref 0–44)
AST: 21 IU/L (ref 0–40)
Albumin/Globulin Ratio: 2 (ref 1.2–2.2)
Albumin: 4.3 g/dL (ref 3.8–4.9)
Alkaline Phosphatase: 71 IU/L (ref 39–117)
BUN/Creatinine Ratio: 14 (ref 9–20)
BUN: 19 mg/dL (ref 6–24)
Bilirubin Total: 0.5 mg/dL (ref 0.0–1.2)
CO2: 27 mmol/L (ref 20–29)
Calcium: 9 mg/dL (ref 8.7–10.2)
Chloride: 101 mmol/L (ref 96–106)
Creatinine, Ser: 1.35 mg/dL — ABNORMAL HIGH (ref 0.76–1.27)
GFR calc Af Amer: 67 mL/min/{1.73_m2} (ref >59)
GFR calc non Af Amer: 58 mL/min/{1.73_m2} — ABNORMAL LOW (ref >59)
Globulin, Total: 2.1 g/dL (ref 1.5–4.5)
Glucose: 76 mg/dL (ref 65–99)
Potassium: 4.3 mmol/L (ref 3.5–5.2)
Sodium: 139 mmol/L (ref 134–144)
Total Protein: 6.4 g/dL (ref 6.0–8.5)

## 2020-03-15 LAB — LIPID PANEL
Chol/HDL Ratio: 2.7 ratio (ref 0.0–5.0)
Cholesterol, Total: 142 mg/dL (ref 100–199)
HDL: 52 mg/dL (ref >39)
LDL Chol Calc (NIH): 78 mg/dL (ref 0–99)
Triglycerides: 55 mg/dL (ref 0–149)
VLDL Cholesterol Cal: 12 mg/dL (ref 5–40)

## 2020-03-15 LAB — CARDIOVASCULAR RISK ASSESSMENT

## 2020-03-15 NOTE — Progress Notes (Signed)
CBC normal, kidney tests stable, liver tests ok, Cholesterol OK,  lp

## 2020-04-05 ENCOUNTER — Ambulatory Visit (INDEPENDENT_AMBULATORY_CARE_PROVIDER_SITE_OTHER): Payer: Medicare Other | Admitting: Sports Medicine

## 2020-04-05 ENCOUNTER — Other Ambulatory Visit: Payer: Self-pay | Admitting: Sports Medicine

## 2020-04-05 ENCOUNTER — Encounter: Payer: Self-pay | Admitting: Sports Medicine

## 2020-04-05 ENCOUNTER — Other Ambulatory Visit: Payer: Self-pay

## 2020-04-05 ENCOUNTER — Ambulatory Visit (INDEPENDENT_AMBULATORY_CARE_PROVIDER_SITE_OTHER): Payer: Medicare Other

## 2020-04-05 DIAGNOSIS — M722 Plantar fascial fibromatosis: Secondary | ICD-10-CM

## 2020-04-05 DIAGNOSIS — M79671 Pain in right foot: Secondary | ICD-10-CM

## 2020-04-05 DIAGNOSIS — M216X1 Other acquired deformities of right foot: Secondary | ICD-10-CM

## 2020-04-05 MED ORDER — TRIAMCINOLONE ACETONIDE 10 MG/ML IJ SUSP: 10.0000 mg | Freq: Once | INTRAMUSCULAR | Status: DC

## 2020-04-05 NOTE — Progress Notes (Signed)
Subjective: Kevin Pena is a 54 y.o. male patient presents to office with complaint of severe heel pain on the right. Patient admits to post static dyskinesia for 1 month in duration, worse in AM. Patient has treated this problem with rest with no relief.  Admits that he was diagnosed with neuropathy several years ago but this pain feels different.  Denies any other pedal complaints.   Review of systems noncontributory  Patient Active Problem List   Diagnosis Date Noted  . Long-term current use of opiate analgesic 03/14/2020  . Spondylosis with myelopathy, 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  . GERD (gastroesophageal reflux disease) 03/14/2020  . Headache, chronic daily 11/19/2017  . 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 Acquired deformities of toe 07/30/2017  . 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 Edema of right foot 07/30/2017  . Neuropathy 06/18/2017  . Tobacco abuse 11/06/2016  . Mild obstructive sleep apnea 09/25/2016  . Postoperative examination 03/10/2016  . Anal pain 01/28/2016  . Obesity 01/28/2016  . Thrombosed external hemorrhoid 01/28/2016  . Plantar fasciitis of right foot 01/04/2016  . Encounter for colorectal cancer screening 01/09/2015  . Nausea with vomiting 10/10/2014  . Fecal urgency 09/15/2014  . Anxiety disorder 09/01/2014  . HIV (human immunodeficiency virus infection) ([place]) 09/01/2014  . 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 Cellulitis of lower leg 03/29/2014  . BP (high blood pressure) 03/29/2014  . Mixed hyperlipidemia 03/29/2014    Current Outpatient Medications on File Prior to Visit  Medication Sig Dispense Refill  . acetaminophen (TYLENOL) 500 MG tablet Take by mouth.    Marland Kitchen atorvastatin (LIPITOR) 20 MG tablet Take 1 tablet by mouth at bedtime.   2  . diazepam (VALIUM) 5 MG tablet Take 10 mg by mouth at bedtime. scheduled    . esomeprazole (NEXIUM) 40  MG capsule Take 40 mg by mouth at bedtime.   3  . fluticasone (FLONASE) 50 MCG/ACT nasal spray SPRAY 1 SPRAY INTO EACH NOSTRIL EVERY DAY AS NEEDED FOR ALLERGIES  6  . hydrochlorothiazide (HYDRODIURIL) 25 MG tablet Take 25 mg by mouth every evening.     . ODEFSEY 200-25-25 MG TABS tablet Take 1 tablet by mouth every morning.    Marland Kitchen oxyCODONE (ROXICODONE) 15 MG immediate release tablet Take 1 tablet (15 mg total) by mouth every 6 (six) hours as needed for pain. 120 tablet 0  . PROAIR HFA 108 (90 BASE) MCG/ACT inhaler Inhale 2 puffs into the lungs every 6 (six) hours as needed for wheezing or shortness of breath.   11  . valACYclovir (VALTREX) 500 MG tablet Take 1 tablet by mouth daily as needed (outbreaks).      No current facility-administered medications on file prior to visit.    Allergies  Allergen Reactions  . Adhesive [Tape] Other (See Comments)    SEVERE IRRITAION  . Ampicillin Nausea And Vomiting  . Doxycycline Rash  . Lyrica [Pregabalin] Nausea And Vomiting and Rash    Objective: Physical Exam General: The patient is alert and oriented x3 in no acute distress.  Dermatology: Skin is warm, dry and supple bilateral lower extremities. Nails 1-10 are normal. There is no erythema, edema, no eccymosis, no open lesions present. Integument is otherwise unremarkable.  Vascular: Dorsalis Pedis pulse and Posterior Tibial pulse are 2/4 bilateral. Capillary fill time is immediate to all digits.  Neurological: Grossly intact to light touch with an achilles reflex of +2/5 and a  negative Tinel's sign bilateral.  Musculoskeletal: Tenderness to palpation at the medial calcaneal tubercale and through the insertion of the plantar fascia on the right foot. No pain with compression of calcaneus bilateral. No pain with tuning fork to calcaneus bilateral. No pain with calf compression bilateral. There is decreased Ankle joint range of motion bilateral. All other joints range of motion within normal  limits bilateral. Strength 5/5 in all groups bilateral.   Gait: Unassisted, Antalgic avoid weight on right heel  Xray, right foot:  Normal osseous mineralization. Joint spaces preserved. No fracture/dislocation/boney destruction. Calcaneal spur present with mild thickening of plantar fascia. No other soft tissue abnormalities or radiopaque foreign bodies.   Assessment and Plan: Problem List Items Addressed This Visit      Musculoskeletal and Integument   Plantar fasciitis of right foot - Primary   Relevant Medications   triamcinolone acetonide (KENALOG) 10 MG/ML injection 10 mg (Start on 04/05/2020  8:45 PM)    Other Visit Diagnoses    Pain of right heel       Acquired equinus deformity of right foot          -Complete examination performed.  -Xrays reviewed -Discussed with patient in detail the condition of plantar fasciitis, how this occurs and general treatment options. Explained both conservative and surgical treatments.  -After oral consent and aseptic prep, injected a mixture containing 1 ml of 2%  plain lidocaine, 1 ml 0.5% plain marcaine, 0.5 ml of kenalog 10 and 0.5 ml of dexamethasone phosphate into Right heel. Post-injection care discussed with patient.  -Recommend patient to start taking meloxicam on which he already has -Recommended good supportive shoes explained in detail the use of the fascial brace for the right which was dispensed at today's visit. -Explained and dispensed to patient daily stretching exercises. -Recommend patient to ice affected area 1-2x daily. -Patient to return to office in 3 to 4 weeks for follow up or sooner if problems or questions arise.  Asencion Islam, DPM

## 2020-04-05 NOTE — Patient Instructions (Signed)

## 2020-04-06 ENCOUNTER — Other Ambulatory Visit: Payer: Self-pay

## 2020-04-06 MED ORDER — OXYCODONE HCL 15 MG PO TABS: 15.0000 mg | ORAL_TABLET | Freq: Four times a day (QID) | ORAL | 0 refills | Status: DC | PRN

## 2020-04-22 ENCOUNTER — Other Ambulatory Visit: Payer: Self-pay | Admitting: Legal Medicine

## 2020-04-26 ENCOUNTER — Other Ambulatory Visit: Payer: Self-pay

## 2020-04-26 ENCOUNTER — Encounter: Payer: Self-pay | Admitting: Sports Medicine

## 2020-04-26 ENCOUNTER — Ambulatory Visit (INDEPENDENT_AMBULATORY_CARE_PROVIDER_SITE_OTHER): Payer: Medicare Other | Admitting: Sports Medicine

## 2020-04-26 DIAGNOSIS — M79671 Pain in right foot: Secondary | ICD-10-CM

## 2020-04-26 DIAGNOSIS — M216X1 Other acquired deformities of right foot: Secondary | ICD-10-CM

## 2020-04-26 DIAGNOSIS — M722 Plantar fascial fibromatosis: Secondary | ICD-10-CM

## 2020-04-26 MED ORDER — TRIAMCINOLONE ACETONIDE 10 MG/ML IJ SUSP: 10.0000 mg | Freq: Once | INTRAMUSCULAR | Status: DC

## 2020-04-26 NOTE — Progress Notes (Signed)
Subjective: Kevin Pena is a 54 y.o. male returns to office for follow up evaluation after Left/Right heel injection for plantar fasciitis, injection #1 administered 3 weeks ago. Patient states that the injection seems to help his pain; pain is now 4/10 and has decreased in frequency to the area feels about 50% better. Patient denies any recent changes in medications or new problems since last visit.   Patient Active Problem List   Diagnosis Date Noted  . Long-term current use of opiate analgesic 03/14/2020  . Spondylosis with myelopathy, 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  . GERD (gastroesophageal reflux disease) 03/14/2020  . Headache, chronic daily 11/19/2017  . 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 Acquired deformities of toe 07/30/2017  . 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 Edema of right foot 07/30/2017  . Neuropathy 06/18/2017  . Tobacco abuse 11/06/2016  . Mild obstructive sleep apnea 09/25/2016  . Postoperative examination 03/10/2016  . Anal pain 01/28/2016  . Obesity 01/28/2016  . Thrombosed external hemorrhoid 01/28/2016  . Plantar fasciitis of right foot 01/04/2016  . Encounter for colorectal cancer screening 01/09/2015  . Nausea with vomiting 10/10/2014  . Fecal urgency 09/15/2014  . Anxiety disorder 09/01/2014  . HIV (human immunodeficiency virus infection) ([place]) 09/01/2014  . 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 Cellulitis of lower leg 03/29/2014  . BP (high blood pressure) 03/29/2014  . Mixed hyperlipidemia 03/29/2014    Current Outpatient Medications on File Prior to Visit  Medication Sig Dispense Refill  . acetaminophen (TYLENOL) 500 MG tablet Take by mouth.    Marland Kitchen atorvastatin (LIPITOR) 20 MG tablet TAKE 1 TABLET BY MOUTH EVERY DAY 90 tablet 2  . diazepam (VALIUM) 5 MG tablet Take 10 mg by mouth at bedtime. scheduled    . esomeprazole (NEXIUM) 40 MG capsule TAKE  1 CAPSULE BY MOUTH TWICE A DAY 180 capsule 2  . fluticasone (FLONASE) 50 MCG/ACT nasal spray SPRAY 1 SPRAY INTO EACH NOSTRIL EVERY DAY AS NEEDED FOR ALLERGIES  6  . hydrochlorothiazide (HYDRODIURIL) 25 MG tablet Take 25 mg by mouth every evening.     . meloxicam (MOBIC) 15 MG tablet Take 15 mg by mouth daily.    . ODEFSEY 200-25-25 MG TABS tablet Take 1 tablet by mouth every morning.    Marland Kitchen oxyCODONE (ROXICODONE) 15 MG immediate release tablet Take 1 tablet (15 mg total) by mouth every 6 (six) hours as needed for pain. 120 tablet 0  . PROAIR HFA 108 (90 BASE) MCG/ACT inhaler Inhale 2 puffs into the lungs every 6 (six) hours as needed for wheezing or shortness of breath.   11  . valACYclovir (VALTREX) 500 MG tablet Take 1 tablet by mouth daily as needed (outbreaks).      Current Facility-Administered Medications on File Prior to Visit  Medication Dose Route Frequency Provider Last Rate Last Admin  . triamcinolone acetonide (KENALOG) 10 MG/ML injection 10 mg  10 mg Other Once Landis Martins, DPM        Allergies  Allergen Reactions  . Adhesive [Tape] Other (See Comments)    SEVERE IRRITAION  . Ampicillin Nausea And Vomiting  . Doxycycline Rash  . Lyrica [Pregabalin] Nausea And Vomiting and Rash    Objective:   General:  Alert and oriented x 3, in no acute distress  Dermatology: Skin is warm, dry, and supple bilateral. Nails are within normal limits. There is no lower extremity erythema, no eccymosis, no open lesions  present bilateral.   Vascular: Dorsalis Pedis and Posterior Tibial pedal pulses are 2/4 bilateral. + hair growth noted bilateral. Capillary Fill Time is 3 seconds in all digits. No varicosities, No edema bilateral lower extremities.   Neurological: Sensation grossly intact to light touch bilateral.  Musculoskeletal: There is decreased tenderness to palpation at the medial calcaneal tubercale and through the insertion of the plantar fascia on the right foot, no pain with  compression to calcaneus or application of tuning fork. There is decreased Ankle joint range of motion bilateral. All other joints range of motion  within normal limits bilateral. Strength 5/5 bilateral.   Assessment and Plan: Problem List Items Addressed This Visit      Musculoskeletal and Integument   Plantar fasciitis of right foot - Primary   Relevant Medications   triamcinolone acetonide (KENALOG) 10 MG/ML injection 10 mg (Start on 04/26/2020  6:15 PM)    Other Visit Diagnoses    Pain of right heel       Acquired equinus deformity of right foot          -Complete examination performed.  -Previous x-rays reviewed. -Discussed with patient in detail the condition of plantar fasciitis with still pain present, how this occurs related to the foot type of the patient and general treatment options. - Patient opted for another injection today; After oral consent and aseptic prep, injected a mixture containing 1 ml of 1%plain lidocaine, 1 ml 0.5% plain marcaine, 0.5 ml of kenalog 10 and 0.5 ml of dexmethasone phosphate to right heel at area of most pain/trigger point injection.  This is injection #2 to the area. -Continue with plantar fascial brace then after 2 weeks may slowly wean as instructed -Continue with meloxicam until completed -Continue with stretching, icing, good supportive shoes daily. -Discussed long term care and reocurrence; will closely monitor; if fails to improve will consider other treatment modalities.  -Patient to return to office if fails to continue to improve or sooner if problems or questions arise.  Asencion Islam, DPM

## 2020-05-07 ENCOUNTER — Other Ambulatory Visit: Payer: Self-pay

## 2020-05-07 MED ORDER — OXYCODONE HCL 15 MG PO TABS: 15.0000 mg | ORAL_TABLET | Freq: Four times a day (QID) | ORAL | 0 refills | Status: DC | PRN

## 2020-05-29 DIAGNOSIS — Z965 Presence of tooth-root and mandibular implants: Secondary | ICD-10-CM

## 2020-05-29 HISTORY — DX: Presence of tooth-root and mandibular implants: Z96.5

## 2020-06-06 ENCOUNTER — Other Ambulatory Visit: Payer: Self-pay

## 2020-06-06 MED ORDER — OXYCODONE HCL 15 MG PO TABS: 15.0000 mg | ORAL_TABLET | Freq: Four times a day (QID) | ORAL | 0 refills | Status: DC | PRN

## 2020-06-13 ENCOUNTER — Other Ambulatory Visit: Payer: Self-pay | Admitting: Sports Medicine

## 2020-06-13 DIAGNOSIS — M722 Plantar fascial fibromatosis: Secondary | ICD-10-CM

## 2020-06-14 ENCOUNTER — Other Ambulatory Visit: Payer: Self-pay

## 2020-06-14 ENCOUNTER — Encounter: Payer: Self-pay | Admitting: Legal Medicine

## 2020-06-14 ENCOUNTER — Ambulatory Visit (INDEPENDENT_AMBULATORY_CARE_PROVIDER_SITE_OTHER): Payer: Medicare Other | Admitting: Legal Medicine

## 2020-06-14 VITALS — BP 128/64 | HR 74 | Temp 97.6°F | Resp 17 | Ht 70.0 in | Wt 197.6 lb

## 2020-06-14 DIAGNOSIS — G629 Polyneuropathy, unspecified: Secondary | ICD-10-CM

## 2020-06-14 DIAGNOSIS — T7840XA Allergy, unspecified, initial encounter: Secondary | ICD-10-CM

## 2020-06-14 DIAGNOSIS — E782 Mixed hyperlipidemia: Secondary | ICD-10-CM

## 2020-06-14 DIAGNOSIS — M4716 Other spondylosis with myelopathy, lumbar region: Secondary | ICD-10-CM | POA: Diagnosis not present

## 2020-06-14 DIAGNOSIS — I10 Essential (primary) hypertension: Secondary | ICD-10-CM | POA: Diagnosis not present

## 2020-06-14 DIAGNOSIS — K5909 Other constipation: Secondary | ICD-10-CM | POA: Insufficient documentation

## 2020-06-14 HISTORY — DX: Other constipation: K59.09

## 2020-06-14 MED ORDER — TRIAMCINOLONE ACETONIDE 40 MG/ML IJ SUSP
80.0000 mg | Freq: Once | INTRAMUSCULAR | Status: AC
  Administered 2020-06-14: 80 mg via INTRAMUSCULAR

## 2020-06-14 MED ORDER — LINACLOTIDE 72 MCG PO CAPS: 72.0000 ug | ORAL_CAPSULE | Freq: Every day | ORAL | 6 refills | Status: DC

## 2020-06-14 NOTE — Progress Notes (Signed)
Subjective:  Patient ID: Kevin Pena, male    DOB: 07-20-1966  Age: 54 y.o. MRN: [id number]  Chief Complaint  Patient presents with  . Hyperlipidemia  . Hypertension    HPI: chronic visit  Patient presents with hyperlipidemia.  Compliance with treatment has been good; patient takes medicines as directed, maintains low cholesterol diet, follows up as directed, and maintains exercise regimen.  Patient is using atorvastatin without problems.  Patient presents for follow up of hypertension.  Patient tolerating HCTZ well with side effects.  Patient was diagnosed with hypertension 2010 so has been treated for hypertension for 10 years.Patient is working on maintaining diet and exercise regimen and follows up as directed. Complication include none  Positive HIV- he is on  ODEFSEY and is undetectable viral particles..     Current Outpatient Medications on File Prior to Visit  Medication Sig Dispense Refill  . atorvastatin (LIPITOR) 20 MG tablet TAKE 1 TABLET BY MOUTH EVERY DAY 90 tablet 2  . diazepam (VALIUM) 5 MG tablet Take 10 mg by mouth at bedtime. scheduled    . esomeprazole (NEXIUM) 40 MG capsule TAKE 1 CAPSULE BY MOUTH TWICE A DAY 180 capsule 2  . fluticasone (FLONASE) 50 MCG/ACT nasal spray SPRAY 1 SPRAY INTO EACH NOSTRIL EVERY DAY AS NEEDED FOR ALLERGIES  6  . hydrochlorothiazide (HYDRODIURIL) 25 MG tablet Take 25 mg by mouth every evening.     . meloxicam (MOBIC) 15 MG tablet Take 15 mg by mouth daily.    . ODEFSEY 200-25-25 MG TABS tablet Take 1 tablet by mouth every morning.    Marland Kitchen oxyCODONE (ROXICODONE) 15 MG immediate release tablet Take 1 tablet (15 mg total) by mouth every 6 (six) hours as needed for pain. 120 tablet 0  . PROAIR HFA 108 (90 BASE) MCG/ACT inhaler Inhale 2 puffs into the lungs every 6 (six) hours as needed for wheezing or shortness of breath.   11  . valACYclovir (VALTREX) 500 MG tablet Take 1 tablet by mouth daily as needed (outbreaks).      No current  facility-administered medications on file prior to visit.   Past Medical History:  Diagnosis Date  . Arthritis   . GERD (gastroesophageal reflux disease)   . Hypertension   . Internal hemorrhoids   . Long-term current use of opiate analgesic 03/14/2020  . Presence of tooth-root and mandibular implants 05/29/2020  . Rash left leg-- using topical cream  . Spondylosis with myelopathy, 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   Past Surgical History:  Procedure Laterality Date  . APPENDECTOMY  1981  . CHOLECYSTECTOMY  2006  . FOOT SURGERY    . HAMMER TOE SURGERY  OCT 2010  . INTERSTIM IMPLANT PLACEMENT  06/17/2012   Procedure: Leane Platt IMPLANT FIRST STAGE;  Surgeon: Martina Sinner, MD;  Location: [facility];  Service: Urology;  Laterality: N/A;  RAD TECH OK PER VICKIE AT MAIN OR   . INTERSTIM IMPLANT PLACEMENT  06/17/2012   Procedure: Leane Platt IMPLANT SECOND STAGE;  Surgeon: Martina Sinner, MD;  Location: [facility];  Service: Urology;  Laterality: N/A;  . MOUTH SURGERY  02/16/2020  . REPAIR RECURRENT RIGHT INGUINAL HERNIA  2000  . RIGHT INGUINAL HERNIA REPAIR  1997  . UMBILICAL HERNIA REPAIR  2010    Family History  Problem Relation Age of Onset  . Other Mother        colon resection, tumor near liver  . Diabetes Mother   . Hyperlipidemia Mother   .  Hypertension Mother   . Varicose Veins Mother   . Heart Problems Father   . Diabetes Father   . Heart disease Father   . Hyperlipidemia Father   . Hypertension Father   . Pancreatic cancer Neg Hx   . Rectal cancer Neg Hx   . Stomach cancer Neg Hx   . Colon cancer Neg Hx    Social History   Socioeconomic History  . Marital status: Significant Other    Spouse name: [name]  . Number of children: 1  . Years of education: College  . Highest education level: Not on file  Occupational History    Employer: OTHER    Comment: disability  Tobacco Use  . Smoking status: Former Smoker    Packs/day:  1.00    Years: 30.00    Pack years: 30.00    Types: Cigarettes    Quit date: 06/11/2010    Years since quitting: 10.0  . Smokeless tobacco: Never Used  Substance and Sexual Activity  . Alcohol use: Yes    Alcohol/week: 2.0 standard drinks    Types: 2 Cans of beer per week    Comment: rarely  . Drug use: No  . Sexual activity: Not Currently    Partners: Male  Other Topics Concern  . Not on file  Social History Narrative   Patient lives at home with partner.   Caffeine Use: 32oz daily coffee/tea   Social Determinants of Corporate investment banker Strain:   . Difficulty of Paying Living Expenses:   Food Insecurity:   . Worried About Programme researcher, broadcasting/film/video in the Last Year:   . Barista in the Last Year:   Transportation Needs:   . Freight forwarder (Medical):   Marland Kitchen Lack of Transportation (Non-Medical):   Physical Activity:   . Days of Exercise per Week:   . Minutes of Exercise per Session:   Stress:   . Feeling of Stress :   Social Connections:   . Frequency of Communication with Friends and Family:   . Frequency of Social Gatherings with Friends and Family:   . Attends Religious Services:   . Active Member of Clubs or Organizations:   . Attends Banker Meetings:   Marland Kitchen Marital Status:     Review of Systems  Constitutional: Negative.   HENT: Positive for congestion and rhinorrhea.   Eyes: Negative.   Respiratory: Negative.   Cardiovascular: Negative.   Gastrointestinal: Negative.   Endocrine: Negative.   Genitourinary: Negative.   Musculoskeletal: Positive for back pain.  Skin: Negative.   Neurological: Negative.   Psychiatric/Behavioral: Negative.      Objective:  BP 128/64 (BP Location: Right Arm, Patient Position: Sitting)   Pulse 74   Temp 97.6 F (36.4 C) (Temporal)   Resp 17   Ht 5\' 10"  (1.778 m)   Wt 197 lb 9.6 oz (89.6 kg)   BMI 28.35 kg/m   BP/Weight 06/14/2020 03/14/2020 06/23/2018  Systolic BP 128 110 113  Diastolic BP 64  70 87  Wt. (Lbs) 197.6 193 190  BMI 28.35 27.69 28.06    Physical Exam Vitals reviewed.  Constitutional:      Appearance: Normal appearance.  HENT:     Head: Normocephalic and atraumatic.     Right Ear: Tympanic membrane, ear canal and external ear normal.     Left Ear: Tympanic membrane, ear canal and external ear normal.     Mouth/Throat:     Mouth:  Mucous membranes are moist.  Eyes:     Extraocular Movements: Extraocular movements intact.     Conjunctiva/sclera: Conjunctivae normal.     Pupils: Pupils are equal, round, and reactive to light.  Cardiovascular:     Rate and Rhythm: Normal rate and regular rhythm.     Pulses: Normal pulses.     Heart sounds: Normal heart sounds.  Pulmonary:     Effort: Pulmonary effort is normal.     Breath sounds: Normal breath sounds.  Abdominal:     General: Abdomen is flat. Bowel sounds are normal.     Palpations: Abdomen is soft.  Musculoskeletal:        General: Normal range of motion.     Cervical back: Normal range of motion and neck supple.  Skin:    General: Skin is warm and dry.     Capillary Refill: Capillary refill takes less than 2 seconds.  Neurological:     General: No focal deficit present.     Mental Status: He is alert and oriented to person, place, and time. Mental status is at baseline.  Psychiatric:        Mood and Affect: Mood normal.        Thought Content: Thought content normal.       Lab Results  Component Value Date   WBC 6.9 03/14/2020   HGB 14.6 03/14/2020   HCT 42.5 03/14/2020   PLT 207 03/14/2020   GLUCOSE 76 03/14/2020   CHOL 142 03/14/2020   TRIG 55 03/14/2020   HDL 52 03/14/2020   LDLCALC 78 03/14/2020   ALT 14 03/14/2020   AST 21 03/14/2020   NA 139 03/14/2020   K 4.3 03/14/2020   CL 101 03/14/2020   CREATININE 1.35 (H) 03/14/2020   BUN 19 03/14/2020   CO2 27 03/14/2020      Assessment & Plan:   1. Allergy, initial encounter - triamcinolone acetonide (KENALOG-40) injection 80  mg Patient is having seasonal allergies  2. Mixed hyperlipidemia - Lipid panel AN INDIVIDUAL CARE PLAN for hyperlipidemia/ cholesterol was established and reinforced today.  The patient's status was assessed using clinical findings on exam, lab and other diagnostic tests. The patient's disease status was assessed based on evidence-based guidelines and found to be well controlled. MEDICATIONS were reviewed. SELF MANAGEMENT GOALS have been discussed and patient's success at attaining the goal of low cholesterol was assessed. RECOMMENDATION given include regular exercise 3 days a week and low cholesterol/low fat diet. CLINICAL SUMMARY including written plan to identify barriers unique to the patient due to social or economic  reasons was discussed.  3. Essential hypertension - CBC with Differential/Platelet - Comprehensive metabolic panel An individual hypertension care plan was established and reinforced today.  The patient's status was assessed using clinical findings on exam and labs or diagnostic tests. The patient's success at meeting treatment goals on disease specific evidence-based guidelines and found to be well controlled. SELF MANAGEMENT: The patient and I together assessed ways to personally work towards obtaining the recommended goals. RECOMMENDATIONS: avoid decongestants found in common cold remedies, decrease consumption of alcohol, perform routine monitoring of BP with home BP cuff, exercise, reduction of dietary salt, take medicines as prescribed, try not to miss doses and quit smoking.  Regular exercise and maintaining a healthy weight is needed.  Stress reduction may help. A CLINICAL SUMMARY including written plan identify barriers to care unique to individual due to social or financial issues.  We attempt to mutually creat solutions  for individual and family understanding.  4. Spondylosis with myelopathy, lumbar region Patient has chronic pain from lumbar spine and right leg  neuropathy, he is on oxycodone  5. Neuropathy He has neuropathy in legs, neurologist stopped gabapentin  6. Chronic constipation Patient continues to constipation and linzess 72 mg prescribed    Meds ordered this encounter  Medications  . triamcinolone acetonide (KENALOG-40) injection 80 mg  . linaclotide (LINZESS) 72 MCG capsule    Sig: Take 1 capsule (72 mcg total) by mouth daily before breakfast.    Dispense:  30 capsule    Refill:  6    Orders Placed This Encounter  Procedures  . CBC with Differential/Platelet  . Comprehensive metabolic panel  . Lipid panel     Follow-up: Return in about 3 months (around 09/14/2020) for fasting.  An After Visit Summary was printed and given to the patient.  Brent Bulla [facility] [phone]

## 2020-06-15 LAB — CARDIOVASCULAR RISK ASSESSMENT

## 2020-06-15 LAB — COMPREHENSIVE METABOLIC PANEL
ALT: 15 IU/L (ref 0–44)
AST: 17 IU/L (ref 0–40)
Albumin/Globulin Ratio: 1.7 (ref 1.2–2.2)
Albumin: 4 g/dL (ref 3.8–4.9)
Alkaline Phosphatase: 65 IU/L (ref 48–121)
BUN/Creatinine Ratio: 20 (ref 9–20)
BUN: 25 mg/dL — ABNORMAL HIGH (ref 6–24)
Bilirubin Total: 0.3 mg/dL (ref 0.0–1.2)
CO2: 25 mmol/L (ref 20–29)
Calcium: 8.7 mg/dL (ref 8.7–10.2)
Chloride: 104 mmol/L (ref 96–106)
Creatinine, Ser: 1.26 mg/dL (ref 0.76–1.27)
GFR calc Af Amer: 73 mL/min/{1.73_m2} (ref >59)
GFR calc non Af Amer: 63 mL/min/{1.73_m2} (ref >59)
Globulin, Total: 2.3 g/dL (ref 1.5–4.5)
Glucose: 91 mg/dL (ref 65–99)
Potassium: 5.2 mmol/L (ref 3.5–5.2)
Sodium: 141 mmol/L (ref 134–144)
Total Protein: 6.3 g/dL (ref 6.0–8.5)

## 2020-06-15 LAB — LIPID PANEL
Chol/HDL Ratio: 3.2 ratio (ref 0.0–5.0)
Cholesterol, Total: 151 mg/dL (ref 100–199)
HDL: 47 mg/dL (ref >39)
LDL Chol Calc (NIH): 91 mg/dL (ref 0–99)
Triglycerides: 65 mg/dL (ref 0–149)
VLDL Cholesterol Cal: 13 mg/dL (ref 5–40)

## 2020-06-15 LAB — CBC WITH DIFFERENTIAL/PLATELET
Basophils Absolute: 0.1 10*3/uL (ref 0.0–0.2)
Basos: 1 %
EOS (ABSOLUTE): 0.2 10*3/uL (ref 0.0–0.4)
Eos: 2 %
Hematocrit: 42.8 % (ref 37.5–51.0)
Hemoglobin: 14.9 g/dL (ref 13.0–17.7)
Immature Grans (Abs): 0.1 10*3/uL (ref 0.0–0.1)
Immature Granulocytes: 1 %
Lymphocytes Absolute: 3.1 10*3/uL (ref 0.7–3.1)
Lymphs: 35 %
MCH: 32.3 pg (ref 26.6–33.0)
MCHC: 34.8 g/dL (ref 31.5–35.7)
MCV: 93 fL (ref 79–97)
Monocytes Absolute: 1 10*3/uL — ABNORMAL HIGH (ref 0.1–0.9)
Monocytes: 11 %
Neutrophils Absolute: 4.4 10*3/uL (ref 1.4–7.0)
Neutrophils: 50 %
Platelets: 264 10*3/uL (ref 150–450)
RBC: 4.62 x10E6/uL (ref 4.14–5.80)
RDW: 12.2 % (ref 11.6–15.4)
WBC: 8.7 10*3/uL (ref 3.4–10.8)

## 2020-06-15 NOTE — Progress Notes (Signed)
Cbc normal, bun high, dehydrated, kidney and liver tests normal, Cholesterol normal lp

## 2020-07-02 ENCOUNTER — Telehealth: Payer: Self-pay

## 2020-07-02 ENCOUNTER — Other Ambulatory Visit: Payer: Self-pay | Admitting: Legal Medicine

## 2020-07-02 DIAGNOSIS — G629 Polyneuropathy, unspecified: Secondary | ICD-10-CM

## 2020-07-06 ENCOUNTER — Other Ambulatory Visit: Payer: Self-pay

## 2020-07-06 MED ORDER — OXYCODONE HCL 15 MG PO TABS: 15.0000 mg | ORAL_TABLET | Freq: Four times a day (QID) | ORAL | 0 refills | Status: DC | PRN

## 2020-08-08 ENCOUNTER — Other Ambulatory Visit: Payer: Self-pay

## 2020-08-08 MED ORDER — OXYCODONE HCL 15 MG PO TABS: 15.0000 mg | ORAL_TABLET | Freq: Four times a day (QID) | ORAL | 0 refills | Status: DC | PRN

## 2020-08-13 ENCOUNTER — Institutional Professional Consult (permissible substitution): Payer: Medicare Other | Admitting: Diagnostic Neuroimaging

## 2020-09-04 ENCOUNTER — Ambulatory Visit: Payer: Medicare Other | Admitting: Neurology

## 2020-09-05 ENCOUNTER — Other Ambulatory Visit: Payer: Self-pay

## 2020-09-05 MED ORDER — OXYCODONE HCL 15 MG PO TABS: 15.0000 mg | ORAL_TABLET | Freq: Four times a day (QID) | ORAL | 0 refills | Status: DC | PRN

## 2020-09-17 ENCOUNTER — Ambulatory Visit (INDEPENDENT_AMBULATORY_CARE_PROVIDER_SITE_OTHER): Payer: Medicare Other | Admitting: Legal Medicine

## 2020-09-17 ENCOUNTER — Other Ambulatory Visit: Payer: Self-pay

## 2020-09-17 ENCOUNTER — Encounter: Payer: Self-pay | Admitting: Legal Medicine

## 2020-09-17 VITALS — BP 128/88 | HR 67 | Temp 97.7°F | Resp 16 | Ht 70.0 in | Wt 206.0 lb

## 2020-09-17 DIAGNOSIS — J301 Allergic rhinitis due to pollen: Secondary | ICD-10-CM

## 2020-09-17 DIAGNOSIS — J309 Allergic rhinitis, unspecified: Secondary | ICD-10-CM | POA: Insufficient documentation

## 2020-09-17 DIAGNOSIS — Z6829 Body mass index (BMI) 29.0-29.9, adult: Secondary | ICD-10-CM

## 2020-09-17 DIAGNOSIS — M4716 Other spondylosis with myelopathy, lumbar region: Secondary | ICD-10-CM | POA: Diagnosis not present

## 2020-09-17 DIAGNOSIS — E782 Mixed hyperlipidemia: Secondary | ICD-10-CM | POA: Diagnosis not present

## 2020-09-17 DIAGNOSIS — Z21 Asymptomatic human immunodeficiency virus [HIV] infection status: Secondary | ICD-10-CM

## 2020-09-17 DIAGNOSIS — I10 Essential (primary) hypertension: Secondary | ICD-10-CM | POA: Diagnosis not present

## 2020-09-17 HISTORY — DX: Body mass index (BMI) 29.0-29.9, adult: Z68.29

## 2020-09-17 HISTORY — DX: Allergic rhinitis, unspecified: J30.9

## 2020-09-17 MED ORDER — OXYCODONE HCL 15 MG PO TABS: 15.0000 mg | ORAL_TABLET | Freq: Four times a day (QID) | ORAL | 0 refills | Status: DC | PRN

## 2020-09-17 MED ORDER — TRIAMCINOLONE ACETONIDE 40 MG/ML IJ SUSP
80.0000 mg | Freq: Once | INTRAMUSCULAR | Status: AC
  Administered 2020-09-17: 80 mg via INTRA_ARTICULAR

## 2020-09-17 NOTE — Progress Notes (Signed)
Subjective:  Patient ID: Jodi GeraldsChristopher Bentler, male    DOB: 1966/05/28  Age: 54 y.o. MRN: [id number]  Chief Complaint  Patient presents with  . Hyperlipidemia    3 month follow up, pt is fasting, ROOM 5    HPI: chronic visit Patient presents for follow up of hypertension.  Patient tolerating HCTZ well with side effects.  Patient was diagnosed with hypertension 2010 so has been treated for hypertension for 10 years.Patient is working on maintaining diet and exercise regimen and follows up as directed. Complication include none.  Patient presents with hyperlipidemia.  Compliance with treatment has been good; patient takes medicines as directed, maintains low cholesterol diet, follows up as directed, and maintains exercise regimen.  Patient is using atorvastatin without problems.  Regional pain right leg.  He is getting ultrasound treatment and it has improved right leg pain.   Current Outpatient Medications on File Prior to Visit  Medication Sig Dispense Refill  . atorvastatin (LIPITOR) 20 MG tablet TAKE 1 TABLET BY MOUTH EVERY DAY 90 tablet 2  . diazepam (VALIUM) 5 MG tablet Take 10 mg by mouth at bedtime. scheduled    . esomeprazole (NEXIUM) 40 MG capsule TAKE 1 CAPSULE BY MOUTH TWICE A DAY 180 capsule 2  . fluticasone (FLONASE) 50 MCG/ACT nasal spray SPRAY 1 SPRAY INTO EACH NOSTRIL EVERY DAY AS NEEDED FOR ALLERGIES  6  . hydrochlorothiazide (HYDRODIURIL) 25 MG tablet Take 25 mg by mouth every evening.     . linaclotide (LINZESS) 72 MCG capsule Take 1 capsule (72 mcg total) by mouth daily before breakfast. 30 capsule 6  . meloxicam (MOBIC) 15 MG tablet Take 15 mg by mouth daily.    . ODEFSEY 200-25-25 MG TABS tablet Take 1 tablet by mouth every morning.    Marland Kitchen. PROAIR HFA 108 (90 BASE) MCG/ACT inhaler Inhale 2 puffs into the lungs every 6 (six) hours as needed for wheezing or shortness of breath.   11  . valACYclovir (VALTREX) 500 MG tablet Take 1 tablet by mouth daily as needed (outbreaks).       No current facility-administered medications on file prior to visit.   Past Medical History:  Diagnosis Date  . Arthritis   . GERD (gastroesophageal reflux disease)   . Internal hemorrhoids   . Long-term current use of opiate analgesic 03/14/2020  . Presence of tooth-root and mandibular implants 05/29/2020  . Rash left leg-- using topical cream  . Spondylosis with myelopathy, 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   Past Surgical History:  Procedure Laterality Date  . APPENDECTOMY  1981  . CHOLECYSTECTOMY  2006  . FOOT SURGERY    . HAMMER TOE SURGERY  OCT 2010  . INTERSTIM IMPLANT PLACEMENT  06/17/2012   Procedure: Leane PlattINTERSTIM IMPLANT FIRST STAGE;  Surgeon: Martina SinnerScott A MacDiarmid, MD;  [facility] [place];  Service: Urology;  Laterality: N/A;  RAD TECH OK PER VICKIE AT MAIN OR   . INTERSTIM IMPLANT PLACEMENT  06/17/2012   Procedure: Leane PlattINTERSTIM IMPLANT SECOND STAGE;  Surgeon: Martina SinnerScott A MacDiarmid, MD;  [facility] [place];  Service: Urology;  Laterality: N/A;  . MOUTH SURGERY  02/16/2020  . REPAIR RECURRENT RIGHT INGUINAL HERNIA  2000  . RIGHT INGUINAL HERNIA REPAIR  1997  . UMBILICAL HERNIA REPAIR  2010    Family History  Problem Relation Age of Onset  . Other Mother        colon resection, tumor near liver  . Diabetes Mother   . Hyperlipidemia Mother   .  Hypertension Mother   . Varicose Veins Mother   . Heart Problems Father   . Diabetes Father   . Heart disease Father   . Hyperlipidemia Father   . Hypertension Father   . Pancreatic cancer Neg Hx   . Rectal cancer Neg Hx   . Stomach cancer Neg Hx   . Colon cancer Neg Hx    Social History   Socioeconomic History  . Marital status: Significant Other    Spouse name: [name]  . Number of children: 1  . Years of education: College  . Highest education level: Not on file  Occupational History    Employer: OTHER    Comment: disability  Tobacco Use  . Smoking status: Former Smoker    Packs/day:  1.00    Years: 30.00    Pack years: 30.00    Types: Cigarettes    Quit date: 06/11/2010    Years since quitting: 10.2  . Smokeless tobacco: Never Used  Substance and Sexual Activity  . Alcohol use: Yes    Alcohol/week: 2.0 standard drinks    Types: 2 Cans of beer per week    Comment: rarely  . Drug use: No  . Sexual activity: Not Currently    Partners: Male  Other Topics Concern  . Not on file  Social History Narrative   Patient lives at home with partner.   Caffeine Use: 32oz daily coffee/tea   Social Determinants of Health   Financial Resource Strain:   . Difficulty of Paying Living Expenses: Not on file  Food Insecurity:   . Worried About Programme researcher, broadcasting/film/video in the Last Year: Not on file  . Ran Out of Food in the Last Year: Not on file  Transportation Needs:   . Lack of Transportation (Medical): Not on file  . Lack of Transportation (Non-Medical): Not on file  Physical Activity:   . Days of Exercise per Week: Not on file  . Minutes of Exercise per Session: Not on file  Stress:   . Feeling of Stress : Not on file  Social Connections:   . Frequency of Communication with Friends and Family: Not on file  . Frequency of Social Gatherings with Friends and Family: Not on file  . Attends Religious Services: Not on file  . Active Member of Clubs or Organizations: Not on file  . Attends Banker Meetings: Not on file  . Marital Status: Not on file    Review of Systems  Constitutional: Negative.   HENT: Negative.   Eyes: Negative.   Respiratory: Negative for cough, shortness of breath and stridor.   Cardiovascular: Negative for chest pain, palpitations and leg swelling.  Gastrointestinal: Negative.   Endocrine: Negative.   Genitourinary: Negative.   Musculoskeletal: Positive for arthralgias and back pain.  Skin: Negative.   Neurological: Negative.   Psychiatric/Behavioral: Negative.      Objective:  BP 128/88   Pulse 67   Temp 97.7 F (36.5 C)    Resp 16   Ht 5\' 10"  (1.778 m)   Wt 206 lb (93.4 kg)   SpO2 97%   BMI 29.56 kg/m   BP/Weight 09/17/2020 06/14/2020 03/14/2020  Systolic BP 128 128 110  Diastolic BP 88 64 70  Wt. (Lbs) 206 197.6 193  BMI 29.56 28.35 27.69    Physical Exam Vitals reviewed.  Constitutional:      Appearance: Normal appearance.  HENT:     Right Ear: Tympanic membrane normal.  Left Ear: Tympanic membrane normal.     Mouth/Throat:     Mouth: Mucous membranes are moist.     Pharynx: Oropharynx is clear.  Eyes:     Extraocular Movements: Extraocular movements intact.     Conjunctiva/sclera: Conjunctivae normal.     Pupils: Pupils are equal, round, and reactive to light.  Cardiovascular:     Rate and Rhythm: Normal rate and regular rhythm.     Pulses: Normal pulses.     Heart sounds: Normal heart sounds.  Pulmonary:     Effort: Pulmonary effort is normal.     Breath sounds: Normal breath sounds.  Abdominal:     General: Abdomen is flat. Bowel sounds are normal.     Palpations: Abdomen is soft.  Musculoskeletal:        General: Normal range of motion.     Cervical back: Normal range of motion and neck supple.     Comments: Chronic back pain, right leg less pain  Skin:    General: Skin is warm.     Capillary Refill: Capillary refill takes less than 2 seconds.  Neurological:     General: No focal deficit present.     Mental Status: He is alert and oriented to person, place, and time. Mental status is at baseline.  Psychiatric:        Mood and Affect: Mood normal.        Behavior: Behavior normal.        Thought Content: Thought content normal.      Lab Results  Component Value Date   WBC 8.7 06/14/2020   HGB 14.9 06/14/2020   HCT 42.8 06/14/2020   PLT 264 06/14/2020   GLUCOSE 91 06/14/2020   CHOL 151 06/14/2020   TRIG 65 06/14/2020   HDL 47 06/14/2020   LDLCALC 91 06/14/2020   ALT 15 06/14/2020   AST 17 06/14/2020   NA 141 06/14/2020   K 5.2 06/14/2020   CL 104 06/14/2020    CREATININE 1.26 06/14/2020   BUN 25 (H) 06/14/2020   CO2 25 06/14/2020      Assessment & Plan:   1. Asymptomatic HIV infection, with no history of HIV-related illness [facility]) Patient has chronic HIV and followed at [facility].  He is negative for active disease on his medicines and has remained so for > 20 years.  2. Mixed hyperlipidemia - Lipid panel AN INDIVIDUAL CARE PLAN for hyperlipidemia/ cholesterol was established and reinforced today.  The patient's status was assessed using clinical findings on exam, lab and other diagnostic tests. The patient's disease status was assessed based on evidence-based guidelines and found to be well controlled. MEDICATIONS were reviewed. SELF MANAGEMENT GOALS have been discussed and patient's success at attaining the goal of low cholesterol was assessed. RECOMMENDATION given include regular exercise 3 days a week and low cholesterol/low fat diet. CLINICAL SUMMARY including written plan to identify barriers unique to the patient due to social or economic  reasons was discussed.  3. Primary hypertension - CBC with Differential/Platelet - Comprehensive metabolic panel An individual hypertension care plan was established and reinforced today.  The patient's status was assessed using clinical findings on exam and labs or diagnostic tests. The patient's success at meeting treatment goals on disease specific evidence-based guidelines and found to be well controlled. SELF MANAGEMENT: The patient and I together assessed ways to personally work towards obtaining the recommended goals. RECOMMENDATIONS: avoid decongestants found in common cold remedies, decrease consumption of alcohol, perform routine monitoring of  BP with home BP cuff, exercise, reduction of dietary salt, take medicines as prescribed, try not to miss doses and quit smoking.  Regular exercise and maintaining a healthy weight is needed.  Stress reduction may help. A CLINICAL SUMMARY including written plan  identify barriers to care unique to individual due to social or financial issues.  We attempt to mutually creat solutions for individual and family understanding.  4. Spondylosis with myelopathy, lumbar region - oxyCODONE (ROXICODONE) 15 MG immediate release tablet; Take 1 tablet (15 mg total) by mouth every 6 (six) hours as needed for pain.  Dispense: 120 tablet; Refill: 0 AN INDIVIDUAL CARE PLAN was established and reinforced today.  The patient's status was assessed using clinical findings on exam, labs, and other diagnostic testing. Patient's success at meeting treatment goals based on disease specific evidence-bassed guidelines and found to be in fair control. RECOMMENDATIONS include maintining present medicines and treatment. He is on chronicoxycodone with no abuse.  Negative REMS.  5. Seasonal allergic rhinitis due to pollen - triamcinolone acetonide (KENALOG-40) injection 80 mg Patient has allergie rhinitis seasonal  6. BMI 29.0-29.9,adult We discussed losing some weight to decrease BMI    Meds ordered this encounter  Medications  . oxyCODONE (ROXICODONE) 15 MG immediate release tablet    Sig: Take 1 tablet (15 mg total) by mouth every 6 (six) hours as needed for pain.    Dispense:  120 tablet    Refill:  0  . triamcinolone acetonide (KENALOG-40) injection 80 mg    Orders Placed This Encounter  Procedures  . CBC with Differential/Platelet  . Comprehensive metabolic panel  . Lipid panel     Follow-up: Return in about 3 months (around 12/18/2020).  An After Visit Summary was printed and given to the patient.  Brent Bulla [facility] [phone]

## 2020-09-18 LAB — LIPID PANEL
Chol/HDL Ratio: 2.9 ratio (ref 0.0–5.0)
Cholesterol, Total: 146 mg/dL (ref 100–199)
HDL: 51 mg/dL (ref >39)
LDL Chol Calc (NIH): 83 mg/dL (ref 0–99)
Triglycerides: 59 mg/dL (ref 0–149)
VLDL Cholesterol Cal: 12 mg/dL (ref 5–40)

## 2020-09-18 LAB — COMPREHENSIVE METABOLIC PANEL
ALT: 16 IU/L (ref 0–44)
AST: 23 IU/L (ref 0–40)
Albumin/Globulin Ratio: 2.3 — ABNORMAL HIGH (ref 1.2–2.2)
Albumin: 4.3 g/dL (ref 3.8–4.9)
Alkaline Phosphatase: 75 IU/L (ref 44–121)
BUN/Creatinine Ratio: 18 (ref 9–20)
BUN: 21 mg/dL (ref 6–24)
Bilirubin Total: 0.4 mg/dL (ref 0.0–1.2)
CO2: 23 mmol/L (ref 20–29)
Calcium: 9.1 mg/dL (ref 8.7–10.2)
Chloride: 103 mmol/L (ref 96–106)
Creatinine, Ser: 1.19 mg/dL (ref 0.76–1.27)
GFR calc Af Amer: 78 mL/min/{1.73_m2} (ref >59)
GFR calc non Af Amer: 68 mL/min/{1.73_m2} (ref >59)
Globulin, Total: 1.9 g/dL (ref 1.5–4.5)
Glucose: 79 mg/dL (ref 65–99)
Potassium: 4.4 mmol/L (ref 3.5–5.2)
Sodium: 139 mmol/L (ref 134–144)
Total Protein: 6.2 g/dL (ref 6.0–8.5)

## 2020-09-18 LAB — CBC WITH DIFFERENTIAL/PLATELET
Basophils Absolute: 0.1 10*3/uL (ref 0.0–0.2)
Basos: 1 %
EOS (ABSOLUTE): 0.2 10*3/uL (ref 0.0–0.4)
Eos: 2 %
Hematocrit: 39.9 % (ref 37.5–51.0)
Hemoglobin: 13.5 g/dL (ref 13.0–17.7)
Immature Grans (Abs): 0 10*3/uL (ref 0.0–0.1)
Immature Granulocytes: 0 %
Lymphocytes Absolute: 2.6 10*3/uL (ref 0.7–3.1)
Lymphs: 33 %
MCH: 32 pg (ref 26.6–33.0)
MCHC: 33.8 g/dL (ref 31.5–35.7)
MCV: 95 fL (ref 79–97)
Monocytes Absolute: 0.9 10*3/uL (ref 0.1–0.9)
Monocytes: 11 %
Neutrophils Absolute: 4.1 10*3/uL (ref 1.4–7.0)
Neutrophils: 53 %
Platelets: 214 10*3/uL (ref 150–450)
RBC: 4.22 x10E6/uL (ref 4.14–5.80)
RDW: 12.1 % (ref 11.6–15.4)
WBC: 7.9 10*3/uL (ref 3.4–10.8)

## 2020-09-18 LAB — CARDIOVASCULAR RISK ASSESSMENT

## 2020-10-09 ENCOUNTER — Other Ambulatory Visit: Payer: Self-pay

## 2020-10-09 DIAGNOSIS — M4716 Other spondylosis with myelopathy, lumbar region: Secondary | ICD-10-CM

## 2020-10-09 MED ORDER — OXYCODONE HCL 15 MG PO TABS: 15.0000 mg | ORAL_TABLET | Freq: Four times a day (QID) | ORAL | 0 refills | Status: DC | PRN

## 2020-11-12 ENCOUNTER — Other Ambulatory Visit: Payer: Self-pay

## 2020-11-12 DIAGNOSIS — M4716 Other spondylosis with myelopathy, lumbar region: Secondary | ICD-10-CM

## 2020-11-12 MED ORDER — OXYCODONE HCL 15 MG PO TABS: 15.0000 mg | ORAL_TABLET | Freq: Four times a day (QID) | ORAL | 0 refills | Status: DC | PRN

## 2020-11-24 HISTORY — PX: OTHER SURGICAL HISTORY: SHX169

## 2020-12-11 DIAGNOSIS — R69 Illness, unspecified: Secondary | ICD-10-CM | POA: Diagnosis not present

## 2020-12-11 DIAGNOSIS — Z0100 Encounter for examination of eyes and vision without abnormal findings: Secondary | ICD-10-CM | POA: Diagnosis not present

## 2020-12-11 DIAGNOSIS — H52223 Regular astigmatism, bilateral: Secondary | ICD-10-CM | POA: Diagnosis not present

## 2020-12-13 ENCOUNTER — Other Ambulatory Visit: Payer: Self-pay

## 2020-12-13 DIAGNOSIS — M4716 Other spondylosis with myelopathy, lumbar region: Secondary | ICD-10-CM

## 2020-12-13 MED ORDER — OXYCODONE HCL 15 MG PO TABS: 15.0000 mg | ORAL_TABLET | Freq: Four times a day (QID) | ORAL | 0 refills | Status: DC | PRN

## 2020-12-25 ENCOUNTER — Other Ambulatory Visit: Payer: Self-pay

## 2020-12-25 ENCOUNTER — Encounter: Payer: Self-pay | Admitting: Legal Medicine

## 2020-12-25 ENCOUNTER — Ambulatory Visit (INDEPENDENT_AMBULATORY_CARE_PROVIDER_SITE_OTHER): Payer: Managed Care, Other (non HMO) | Admitting: Legal Medicine

## 2020-12-25 VITALS — BP 112/70 | HR 67 | Temp 96.4°F | Resp 16 | Ht 70.0 in | Wt 197.8 lb

## 2020-12-25 DIAGNOSIS — K2100 Gastro-esophageal reflux disease with esophagitis, without bleeding: Secondary | ICD-10-CM

## 2020-12-25 DIAGNOSIS — Z21 Asymptomatic human immunodeficiency virus [HIV] infection status: Secondary | ICD-10-CM | POA: Diagnosis not present

## 2020-12-25 DIAGNOSIS — E782 Mixed hyperlipidemia: Secondary | ICD-10-CM | POA: Diagnosis not present

## 2020-12-25 DIAGNOSIS — Z79891 Long term (current) use of opiate analgesic: Secondary | ICD-10-CM

## 2020-12-25 DIAGNOSIS — G629 Polyneuropathy, unspecified: Secondary | ICD-10-CM

## 2020-12-25 DIAGNOSIS — R69 Illness, unspecified: Secondary | ICD-10-CM | POA: Diagnosis not present

## 2020-12-25 DIAGNOSIS — I10 Essential (primary) hypertension: Secondary | ICD-10-CM

## 2020-12-25 NOTE — Progress Notes (Signed)
Subjective:  Patient ID: Kevin Pena, male    DOB: 08-Jul-1966  Age: 55 y.o. MRN: [id number]  Chief Complaint  Patient presents with  . Hyperlipidemia  . Hypertension    HPI: Chronic visit  Patient presents for follow up of hypertension.  Patient tolerating HCTZ well with side effects.  Patient was diagnosed with hypertension 2010 so has been treated for hypertension for 10 years.Patient is working on maintaining diet and exercise regimen and follows up as directed. Complication include none.  HIV stable  Patient presents with hyperlipidemia.  Compliance with treatment has been good; patient takes medicines as directed, maintains low cholesterol diet, follows up as directed, and maintains exercise regimen.  Patient is using atorvastatin without problems.   Current Outpatient Medications on File Prior to Visit  Medication Sig Dispense Refill  . atorvastatin (LIPITOR) 20 MG tablet TAKE 1 TABLET BY MOUTH EVERY DAY 90 tablet 2  . diazepam (VALIUM) 5 MG tablet Take 10 mg by mouth at bedtime. scheduled    . esomeprazole (NEXIUM) 40 MG capsule TAKE 1 CAPSULE BY MOUTH TWICE A DAY 180 capsule 2  . fluticasone (FLONASE) 50 MCG/ACT nasal spray SPRAY 1 SPRAY INTO EACH NOSTRIL EVERY DAY AS NEEDED FOR ALLERGIES  6  . hydrochlorothiazide (HYDRODIURIL) 25 MG tablet Take 25 mg by mouth every evening.     . linaclotide (LINZESS) 72 MCG capsule Take 1 capsule (72 mcg total) by mouth daily before breakfast. 30 capsule 6  . ODEFSEY 200-25-25 MG TABS tablet Take 1 tablet by mouth every morning.    Marland Kitchen oxyCODONE (ROXICODONE) 15 MG immediate release tablet Take 1 tablet (15 mg total) by mouth every 6 (six) hours as needed for pain. 120 tablet 0  . PROAIR HFA 108 (90 BASE) MCG/ACT inhaler Inhale 2 puffs into the lungs every 6 (six) hours as needed for wheezing or shortness of breath.   11  . valACYclovir (VALTREX) 500 MG tablet Take 1 tablet by mouth daily as needed (outbreaks).     . meloxicam (MOBIC) 15  MG tablet Take 15 mg by mouth daily. (Patient not taking: Reported on 12/25/2020)     No current facility-administered medications on file prior to visit.   Past Medical History:  Diagnosis Date  . Arthritis   . GERD (gastroesophageal reflux disease)   . Internal hemorrhoids   . Long-term current use of opiate analgesic 03/14/2020  . Presence of tooth-root and mandibular implants 05/29/2020  . Rash left leg-- using topical cream  . Spondylosis with myelopathy, 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   Past Surgical History:  Procedure Laterality Date  . APPENDECTOMY  1981  . CHOLECYSTECTOMY  2006  . dental implants  11/2020  . FOOT SURGERY    . HAMMER TOE SURGERY  OCT 2010  . INTERSTIM IMPLANT PLACEMENT  06/17/2012   Procedure: Leane Platt IMPLANT FIRST STAGE;  Surgeon: Martina Sinner, MD;  Location: [facility];  Service: Urology;  Laterality: N/A;  RAD TECH OK PER VICKIE AT MAIN OR   . INTERSTIM IMPLANT PLACEMENT  06/17/2012   Procedure: Leane Platt IMPLANT SECOND STAGE;  Surgeon: Martina Sinner, MD;  Location: [facility];  Service: Urology;  Laterality: N/A;  . MOUTH SURGERY  02/16/2020  . REPAIR RECURRENT RIGHT INGUINAL HERNIA  2000  . RIGHT INGUINAL HERNIA REPAIR  1997  . UMBILICAL HERNIA REPAIR  2010    Family History  Problem Relation Age of Onset  . Other Mother  colon resection, tumor near liver  . Diabetes Mother   . Hyperlipidemia Mother   . Hypertension Mother   . Varicose Veins Mother   . Heart Problems Father   . Diabetes Father   . Heart disease Father   . Hyperlipidemia Father   . Hypertension Father   . Pancreatic cancer Neg Hx   . Rectal cancer Neg Hx   . Stomach cancer Neg Hx   . Colon cancer Neg Hx    Social History   Socioeconomic History  . Marital status: Significant Other    Spouse name: [name]  . Number of children: 1  . Years of education: College  . Highest education level: Not on file  Occupational History     Employer: OTHER    Comment: disability  Tobacco Use  . Smoking status: Former Smoker    Packs/day: 1.00    Years: 30.00    Pack years: 30.00    Types: Cigarettes    Quit date: 06/11/2010    Years since quitting: 10.5  . Smokeless tobacco: Never Used  Vaping Use  . Vaping Use: Some days  Substance and Sexual Activity  . Alcohol use: Yes    Alcohol/week: 2.0 standard drinks    Types: 2 Cans of beer per week    Comment: rarely  . Drug use: No  . Sexual activity: Not Currently    Partners: Male  Other Topics Concern  . Not on file  Social History Narrative   Patient lives at home with partner.   Caffeine Use: 32oz daily coffee/tea   Social Determinants of Corporate investment banker Strain: Not on file  Food Insecurity: Not on file  Transportation Needs: Not on file  Physical Activity: Not on file  Stress: Not on file  Social Connections: Not on file    Review of Systems  Constitutional: Negative for activity change, appetite change and unexpected weight change.  HENT: Negative for congestion and sinus pain.   Eyes: Negative for visual disturbance.  Respiratory: Negative for chest tightness and shortness of breath.   Cardiovascular: Negative for chest pain and leg swelling.  Gastrointestinal: Negative for abdominal distention and abdominal pain.  Endocrine: Negative for polyuria.  Genitourinary: Negative for difficulty urinating, dysuria and urgency.  Musculoskeletal: Negative for arthralgias and back pain.  Skin: Negative.   Neurological: Negative.   Psychiatric/Behavioral: Negative.      Objective:  BP 112/70 (BP Location: Left Arm, Patient Position: Sitting, Cuff Size: Normal)   Pulse 67   Temp (!) 96.4 F (35.8 C) (Temporal)   Resp 16   Ht 5\' 10"  (1.778 m)   Wt 197 lb 12.8 oz (89.7 kg)   SpO2 97%   BMI 28.38 kg/m   BP/Weight 12/25/2020 09/17/2020 06/14/2020  Systolic BP 112 128 128  Diastolic BP 70 88 64  Wt. (Lbs) 197.8 206 197.6  BMI 28.38 29.56  28.35    Physical Exam Vitals reviewed.  Constitutional:      Appearance: Normal appearance.  HENT:     Head: Normocephalic and atraumatic.     Right Ear: Tympanic membrane, ear canal and external ear normal.     Left Ear: Tympanic membrane, ear canal and external ear normal.     Mouth/Throat:     Mouth: Mucous membranes are moist.     Pharynx: Oropharynx is clear.  Eyes:     Extraocular Movements: Extraocular movements intact.     Conjunctiva/sclera: Conjunctivae normal.     Pupils: Pupils  are equal, round, and reactive to light.  Cardiovascular:     Rate and Rhythm: Normal rate and regular rhythm.     Pulses: Normal pulses.     Heart sounds: Normal heart sounds. No murmur heard. No gallop.   Pulmonary:     Effort: No respiratory distress.     Breath sounds: Normal breath sounds. No rales.  Abdominal:     General: Abdomen is flat. Bowel sounds are normal. There is no distension.     Palpations: Abdomen is soft.     Tenderness: There is no abdominal tenderness.  Musculoskeletal:     Cervical back: Normal range of motion.     Right lower leg: No edema.     Left lower leg: No edema.     Comments: Back pain right leg pain  Skin:    General: Skin is warm.     Capillary Refill: Capillary refill takes less than 2 seconds.  Neurological:     General: No focal deficit present.     Mental Status: He is alert and oriented to person, place, and time. Mental status is at baseline.  Psychiatric:        Mood and Affect: Mood normal.        Behavior: Behavior normal.        Thought Content: Thought content normal.        Judgment: Judgment normal.    Depression screen [facility] 2/9 12/25/2020 03/14/2020  Decreased Interest 0 0  Down, Depressed, Hopeless 0 0  PHQ - 2 Score 0 0       Lab Results  Component Value Date   WBC 7.9 09/17/2020   HGB 13.5 09/17/2020   HCT 39.9 09/17/2020   PLT 214 09/17/2020   GLUCOSE 79 09/17/2020   CHOL 146 09/17/2020   TRIG 59 09/17/2020   HDL 51  09/17/2020   LDLCALC 83 09/17/2020   ALT 16 09/17/2020   AST 23 09/17/2020   NA 139 09/17/2020   K 4.4 09/17/2020   CL 103 09/17/2020   CREATININE 1.19 09/17/2020   BUN 21 09/17/2020   CO2 23 09/17/2020      Assessment & Plan:   1. Asymptomatic HIV infection, with no history of HIV-related illness [facility]) Patient remains on medicines and is free of active disease  2. Mixed hyperlipidemia AN INDIVIDUAL CARE PLAN for hyperlipidemia/ cholesterol was established and reinforced today.  The patient's status was assessed using clinical findings on exam, lab and other diagnostic tests. The patient's disease status was assessed based on evidence-based guidelines and found to be well controlled. MEDICATIONS were reviewed. SELF MANAGEMENT GOALS have been discussed and patient's success at attaining the goal of low cholesterol was assessed. RECOMMENDATION given include regular exercise 3 days a week and low cholesterol/low fat diet. CLINICAL SUMMARY including written plan to identify barriers unique to the patient due to social or economic  reasons was discussed.  3. Primary hypertension An individual hypertension care plan was established and reinforced today.  The patient's status was assessed using clinical findings on exam and labs or diagnostic tests. The patient's success at meeting treatment goals on disease specific evidence-based guidelines and found to be well controlled. SELF MANAGEMENT: The patient and I together assessed ways to personally work towards obtaining the recommended goals. RECOMMENDATIONS: avoid decongestants found in common cold remedies, decrease consumption of alcohol, perform routine monitoring of BP with home BP cuff, exercise, reduction of dietary salt, take medicines as prescribed, try not to miss doses  and quit smoking.  Regular exercise and maintaining a healthy weight is needed.  Stress reduction may help. A CLINICAL SUMMARY including written plan identify barriers  to care unique to individual due to social or financial issues.  We attempt to mutually creat solutions for individual and family understanding.  4. Neuropathy AN INDIVIDUAL CARE PLAN neuropathy was established and reinforced today.  The patient's status was assessed using clinical findings on exam, labs, and other diagnostic testing. Patient's success at meeting treatment goals based on disease specific evidence-bassed guidelines and found to be in good control. RECOMMENDATIONS include maintaining present medicines and treatment.  5. Long-term current use of opiate analgesic AN INDIVIDUAL CARE PLAN was established and reinforced today.  The patient's status was assessed using clinical findings on exam, labs, and other diagnostic testing. Patient's success at meeting treatment goals based on disease specific evidence-bassed guidelines and found to be in fair control. RECOMMENDATIONS include maintining present medicines and treatment. He is on chronicoxycodone with no abuse.  Negative REMS.  6. Gastroesophageal reflux disease with esophagitis without hemorrhage Plan of care was formulated today.  he is doing well.  A plan of care was formulated using patient exam, tests and other sources to optimize care using evidence based information.  Recommend no smoking, no eating after supper, avoid fatty foods, elevate Head of bed, avoid tight fitting clothing.  Continue on nexium.          I spent 30 minutes dedicated to the care of this patient on the date of this encounter to include face-to-face time with the patient, as well as:   Follow-up: Return in about 3 months (around 03/24/2021).  An After Visit Summary was printed and given to the patient.  Brent Bulla, MD [facility] [phone]

## 2020-12-26 LAB — COMPREHENSIVE METABOLIC PANEL
ALT: 15 IU/L (ref 0–44)
AST: 19 IU/L (ref 0–40)
Albumin/Globulin Ratio: 1.7 (ref 1.2–2.2)
Albumin: 3.9 g/dL (ref 3.8–4.9)
Alkaline Phosphatase: 83 IU/L (ref 44–121)
BUN/Creatinine Ratio: 11 (ref 9–20)
BUN: 16 mg/dL (ref 6–24)
Bilirubin Total: 0.3 mg/dL (ref 0.0–1.2)
CO2: 26 mmol/L (ref 20–29)
Calcium: 8.8 mg/dL (ref 8.7–10.2)
Chloride: 100 mmol/L (ref 96–106)
Creatinine, Ser: 1.4 mg/dL — ABNORMAL HIGH (ref 0.76–1.27)
GFR calc Af Amer: 64 mL/min/{1.73_m2} (ref >59)
GFR calc non Af Amer: 56 mL/min/{1.73_m2} — ABNORMAL LOW (ref >59)
Globulin, Total: 2.3 g/dL (ref 1.5–4.5)
Glucose: 81 mg/dL (ref 65–99)
Potassium: 4.4 mmol/L (ref 3.5–5.2)
Sodium: 138 mmol/L (ref 134–144)
Total Protein: 6.2 g/dL (ref 6.0–8.5)

## 2020-12-26 LAB — LIPID PANEL
Chol/HDL Ratio: 3.6 ratio (ref 0.0–5.0)
Cholesterol, Total: 160 mg/dL (ref 100–199)
HDL: 45 mg/dL (ref >39)
LDL Chol Calc (NIH): 100 mg/dL — ABNORMAL HIGH (ref 0–99)
Triglycerides: 76 mg/dL (ref 0–149)
VLDL Cholesterol Cal: 15 mg/dL (ref 5–40)

## 2020-12-26 LAB — CBC WITH DIFFERENTIAL/PLATELET
Basophils Absolute: 0.1 10*3/uL (ref 0.0–0.2)
Basos: 1 %
EOS (ABSOLUTE): 0.1 10*3/uL (ref 0.0–0.4)
Eos: 1 %
Hematocrit: 43.5 % (ref 37.5–51.0)
Hemoglobin: 15 g/dL (ref 13.0–17.7)
Immature Grans (Abs): 0 10*3/uL (ref 0.0–0.1)
Immature Granulocytes: 0 %
Lymphocytes Absolute: 2.3 10*3/uL (ref 0.7–3.1)
Lymphs: 24 %
MCH: 32.8 pg (ref 26.6–33.0)
MCHC: 34.5 g/dL (ref 31.5–35.7)
MCV: 95 fL (ref 79–97)
Monocytes Absolute: 1.4 10*3/uL — ABNORMAL HIGH (ref 0.1–0.9)
Monocytes: 14 %
Neutrophils Absolute: 5.7 10*3/uL (ref 1.4–7.0)
Neutrophils: 60 %
Platelets: 233 10*3/uL (ref 150–450)
RBC: 4.58 x10E6/uL (ref 4.14–5.80)
RDW: 11.9 % (ref 11.6–15.4)
WBC: 9.6 10*3/uL (ref 3.4–10.8)

## 2020-12-26 LAB — CARDIOVASCULAR RISK ASSESSMENT

## 2020-12-26 NOTE — Progress Notes (Signed)
Cbc normal, kidney shows some grade 3a changes but might be dehydration, Liver tests normal, LDL cholesterol 100,  lp

## 2021-01-02 ENCOUNTER — Other Ambulatory Visit: Payer: Self-pay | Admitting: Legal Medicine

## 2021-01-14 ENCOUNTER — Other Ambulatory Visit: Payer: Self-pay

## 2021-01-14 DIAGNOSIS — M4716 Other spondylosis with myelopathy, lumbar region: Secondary | ICD-10-CM

## 2021-01-14 MED ORDER — OXYCODONE HCL 15 MG PO TABS: 15.0000 mg | ORAL_TABLET | Freq: Four times a day (QID) | ORAL | 0 refills | Status: DC | PRN

## 2021-01-28 DIAGNOSIS — R69 Illness, unspecified: Secondary | ICD-10-CM | POA: Diagnosis not present

## 2021-01-28 DIAGNOSIS — Z87891 Personal history of nicotine dependence: Secondary | ICD-10-CM | POA: Diagnosis not present

## 2021-01-28 DIAGNOSIS — Z23 Encounter for immunization: Secondary | ICD-10-CM | POA: Diagnosis not present

## 2021-01-28 DIAGNOSIS — I10 Essential (primary) hypertension: Secondary | ICD-10-CM | POA: Diagnosis not present

## 2021-01-28 DIAGNOSIS — Z79899 Other long term (current) drug therapy: Secondary | ICD-10-CM | POA: Diagnosis not present

## 2021-02-05 ENCOUNTER — Telehealth: Payer: Self-pay

## 2021-02-05 NOTE — Telephone Encounter (Signed)
Error. Pt already spoke w/ another nurse.  Lorita Officer, [place] 02/05/21 1:53 PM

## 2021-02-06 ENCOUNTER — Other Ambulatory Visit: Payer: Self-pay

## 2021-02-06 ENCOUNTER — Ambulatory Visit (INDEPENDENT_AMBULATORY_CARE_PROVIDER_SITE_OTHER): Payer: Managed Care, Other (non HMO) | Admitting: Legal Medicine

## 2021-02-06 ENCOUNTER — Encounter: Payer: Self-pay | Admitting: Legal Medicine

## 2021-02-06 VITALS — BP 112/80 | HR 64 | Temp 97.4°F | Resp 16 | Ht 70.0 in | Wt 198.0 lb

## 2021-02-06 DIAGNOSIS — Z1152 Encounter for screening for COVID-19: Secondary | ICD-10-CM

## 2021-02-06 DIAGNOSIS — J301 Allergic rhinitis due to pollen: Secondary | ICD-10-CM | POA: Diagnosis not present

## 2021-02-06 LAB — POC COVID19 BINAXNOW: SARS Coronavirus 2 Ag: NEGATIVE

## 2021-02-06 MED ORDER — TRIAMCINOLONE ACETONIDE 40 MG/ML IJ SUSP
80.0000 mg | Freq: Once | INTRAMUSCULAR | Status: AC
  Administered 2021-02-06: 80 mg via INTRAMUSCULAR

## 2021-02-06 NOTE — Progress Notes (Signed)
Acute Office Visit  Subjective:    Patient ID: Kevin Pena, male    DOB: January 18, 1966, 55 y.o.   MRN: [id number]  Chief Complaint  Patient presents with  . Sore Throat    Scratchy throat, watery eyes and sneezing. States been taking Allegra-D but has not taken it in a week. Patient requesting injection.    HPI Patient is in today for patient has a sore throat.  And allergies with sneezing and cough for one week.  No cough , fever or chills. He has this every spring  Past Medical History:  Diagnosis Date  . Arthritis   . GERD (gastroesophageal reflux disease)   . Internal hemorrhoids   . Long-term current use of opiate analgesic 03/14/2020  . Presence of tooth-root and mandibular implants 05/29/2020  . Rash left leg-- using topical cream  . Spondylosis with myelopathy, 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    Past Surgical History:  Procedure Laterality Date  . APPENDECTOMY  1981  . CHOLECYSTECTOMY  2006  . dental implants  11/2020  . FOOT SURGERY    . HAMMER TOE SURGERY  OCT 2010  . INTERSTIM IMPLANT PLACEMENT  06/17/2012   Procedure: Leane Platt IMPLANT FIRST STAGE;  Surgeon: Martina Sinner, MD;  Location: [facility]  Service: Urology;  Laterality: N/A;  RAD TECH OK PER VICKIE AT MAIN OR   . INTERSTIM IMPLANT PLACEMENT  06/17/2012   Procedure: Leane Platt IMPLANT SECOND STAGE;  Surgeon: Martina Sinner, MD;  Location: [facility];  Service: Urology;  Laterality: N/A;  . MOUTH SURGERY  02/16/2020  . REPAIR RECURRENT RIGHT INGUINAL HERNIA  2000  . RIGHT INGUINAL HERNIA REPAIR  1997  . UMBILICAL HERNIA REPAIR  2010    Family History  Problem Relation Age of Onset  . Other Mother        colon resection, tumor near liver  . Diabetes Mother   . Hyperlipidemia Mother   . Hypertension Mother   . Varicose Veins Mother   . Heart Problems Father   . Diabetes Father   . Heart disease Father   . Hyperlipidemia Father   . Hypertension Father   .  Pancreatic cancer Neg Hx   . Rectal cancer Neg Hx   . Stomach cancer Neg Hx   . Colon cancer Neg Hx     Social History   Socioeconomic History  . Marital status: Significant Other    Spouse name: [name]  . Number of children: 1  . Years of education: College  . Highest education level: Not on file  Occupational History    Employer: OTHER    Comment: disability  Tobacco Use  . Smoking status: Former Smoker    Packs/day: 1.00    Years: 30.00    Pack years: 30.00    Types: Cigarettes    Quit date: 06/11/2010    Years since quitting: 10.6  . Smokeless tobacco: Never Used  Vaping Use  . Vaping Use: Some days  Substance and Sexual Activity  . Alcohol use: Yes    Alcohol/week: 2.0 standard drinks    Types: 2 Cans of beer per week    Comment: rarely  . Drug use: No  . Sexual activity: Not Currently    Partners: Male  Other Topics Concern  . Not on file  Social History Narrative   Patient lives at home with partner.   Caffeine Use: 32oz daily coffee/tea   Social Determinants of Corporate investment banker  Strain: Not on file  Food Insecurity: Not on file  Transportation Needs: Not on file  Physical Activity: Not on file  Stress: Not on file  Social Connections: Not on file  Intimate Partner Violence: Not on file    Outpatient Medications Prior to Visit  Medication Sig Dispense Refill  . atorvastatin (LIPITOR) 20 MG tablet TAKE 1 TABLET BY MOUTH EVERY DAY 90 tablet 2  . diazepam (VALIUM) 5 MG tablet Take 10 mg by mouth at bedtime. scheduled    . esomeprazole (NEXIUM) 40 MG capsule TAKE 1 CAPSULE BY MOUTH TWICE A DAY 60 capsule 8  . fluticasone (FLONASE) 50 MCG/ACT nasal spray SPRAY 1 SPRAY INTO EACH NOSTRIL EVERY DAY AS NEEDED FOR ALLERGIES  6  . hydrochlorothiazide (HYDRODIURIL) 25 MG tablet Take 25 mg by mouth every evening.     . linaclotide (LINZESS) 72 MCG capsule Take 1 capsule (72 mcg total) by mouth daily before breakfast. 30 capsule 6  . meloxicam  (MOBIC) 15 MG tablet Take 15 mg by mouth daily.    . ODEFSEY 200-25-25 MG TABS tablet Take 1 tablet by mouth every morning.    Marland Kitchen oxyCODONE (ROXICODONE) 15 MG immediate release tablet Take 1 tablet (15 mg total) by mouth every 6 (six) hours as needed for pain. 120 tablet 0  . PROAIR HFA 108 (90 BASE) MCG/ACT inhaler Inhale 2 puffs into the lungs every 6 (six) hours as needed for wheezing or shortness of breath.   11  . valACYclovir (VALTREX) 500 MG tablet Take 1 tablet by mouth daily as needed (outbreaks).      No facility-administered medications prior to visit.    Allergies  Allergen Reactions  . Adhesive [Tape] Other (See Comments)    SEVERE IRRITAION  . Ampicillin Nausea And Vomiting  . Doxycycline Rash  . Lyrica [Pregabalin] Nausea And Vomiting and Rash    Review of Systems  Constitutional: Negative for activity change and appetite change.  HENT: Positive for congestion, postnasal drip, rhinorrhea and sinus pain.   Eyes: Negative for visual disturbance.  Respiratory: Negative for chest tightness and shortness of breath.   Cardiovascular: Negative for chest pain, palpitations and leg swelling.  Gastrointestinal: Negative for abdominal distention and abdominal pain.  Endocrine: Negative for polyuria.  Genitourinary: Negative for difficulty urinating, dysuria and urgency.  Musculoskeletal: Negative for arthralgias and back pain.  Skin: Negative.   Neurological: Negative.   Psychiatric/Behavioral: Negative.        Objective:    Physical Exam Vitals reviewed.  Constitutional:      Appearance: He is well-developed.  HENT:     Head: Normocephalic.     Right Ear: Tympanic membrane normal. Drainage and tenderness present.     Left Ear: Tympanic membrane normal.     Nose: Congestion and rhinorrhea present.     Mouth/Throat:     Mouth: Mucous membranes are moist.     Tonsils: No tonsillar exudate or tonsillar abscesses.  Eyes:     Conjunctiva/sclera: Conjunctivae normal.      Pupils: Pupils are equal, round, and reactive to light.  Cardiovascular:     Rate and Rhythm: Regular rhythm.     Heart sounds: Normal heart sounds.  Pulmonary:     Effort: Pulmonary effort is normal. No respiratory distress.     Breath sounds: Normal breath sounds. No wheezing or rales.  Abdominal:     General: Bowel sounds are normal.  Musculoskeletal:     Cervical back: Normal range of motion and  neck supple.  Neurological:     Mental Status: He is alert.     BP 112/80 (BP Location: Left Arm, Patient Position: Sitting, Cuff Size: Normal)   Pulse 64   Temp (!) 97.4 F (36.3 C) (Temporal)   Resp 16   Ht 5\' 10"  (1.778 m)   Wt 198 lb (89.8 kg)   SpO2 99%   BMI 28.41 kg/m  Wt Readings from Last 3 Encounters:  02/06/21 198 lb (89.8 kg)  12/25/20 197 lb 12.8 oz (89.7 kg)  09/17/20 206 lb (93.4 kg)    Health Maintenance Due  Topic Date Due  . COVID-19 Vaccine (3 - Moderna risk 4-dose series) 05/07/2020    There are no preventive care reminders to display for this patient.   No results found for: TSH Lab Results  Component Value Date   WBC 9.6 12/25/2020   HGB 15.0 12/25/2020   HCT 43.5 12/25/2020   MCV 95 12/25/2020   PLT 233 12/25/2020   Lab Results  Component Value Date   NA 138 12/25/2020   K 4.4 12/25/2020   CO2 26 12/25/2020   GLUCOSE 81 12/25/2020   BUN 16 12/25/2020   CREATININE 1.40 (H) 12/25/2020   BILITOT 0.3 12/25/2020   ALKPHOS 83 12/25/2020   AST 19 12/25/2020   ALT 15 12/25/2020   PROT 6.2 12/25/2020   ALBUMIN 3.9 12/25/2020   CALCIUM 8.8 12/25/2020   ANIONGAP 8 06/23/2018   Lab Results  Component Value Date   CHOL 160 12/25/2020   Lab Results  Component Value Date   HDL 45 12/25/2020   Lab Results  Component Value Date   LDLCALC 100 (H) 12/25/2020   Lab Results  Component Value Date   TRIG 76 12/25/2020   Lab Results  Component Value Date   CHOLHDL 3.6 12/25/2020   No results found for: HGBA1C     Assessment & Plan:   Diagnoses and all orders for this visit: Encounter for screening for COVID-19 -     POC COVID-19 Negative Covid  Seasonal allergic rhinitis due to pollen -     triamcinolone acetonide (KENALOG-40) injection 80 mg Treat seasonal allergies with injection and continue flonase    Orders Placed This Encounter  Procedures  . POC COVID-19     I spent 15 minutes dedicated to the care of this patient on the date of this encounter to include face-to-face time with the patient, as well as:   Follow-up: Return if symptoms worsen or fail to improve.  An After Visit Summary was printed and given to the patient.  02/22/2021, MD [facility] [phone]

## 2021-02-06 NOTE — Progress Notes (Signed)
Negative Covid lp

## 2021-02-08 ENCOUNTER — Other Ambulatory Visit: Payer: Self-pay | Admitting: Legal Medicine

## 2021-02-11 ENCOUNTER — Other Ambulatory Visit: Payer: Self-pay

## 2021-02-11 DIAGNOSIS — M4716 Other spondylosis with myelopathy, lumbar region: Secondary | ICD-10-CM

## 2021-02-11 MED ORDER — OXYCODONE HCL 15 MG PO TABS: 15.0000 mg | ORAL_TABLET | Freq: Four times a day (QID) | ORAL | 0 refills | Status: DC | PRN

## 2021-03-14 ENCOUNTER — Other Ambulatory Visit: Payer: Self-pay

## 2021-03-14 DIAGNOSIS — M4716 Other spondylosis with myelopathy, lumbar region: Secondary | ICD-10-CM

## 2021-03-14 MED ORDER — OXYCODONE HCL 15 MG PO TABS: 15.0000 mg | ORAL_TABLET | Freq: Four times a day (QID) | ORAL | 0 refills | Status: DC | PRN

## 2021-03-27 ENCOUNTER — Ambulatory Visit (INDEPENDENT_AMBULATORY_CARE_PROVIDER_SITE_OTHER): Payer: Managed Care, Other (non HMO) | Admitting: Legal Medicine

## 2021-03-27 ENCOUNTER — Other Ambulatory Visit: Payer: Self-pay

## 2021-03-27 ENCOUNTER — Encounter: Payer: Self-pay | Admitting: Legal Medicine

## 2021-03-27 VITALS — BP 106/78 | HR 64 | Temp 97.3°F | Resp 18 | Ht 70.0 in | Wt 185.0 lb

## 2021-03-27 DIAGNOSIS — K2100 Gastro-esophageal reflux disease with esophagitis, without bleeding: Secondary | ICD-10-CM | POA: Diagnosis not present

## 2021-03-27 DIAGNOSIS — J301 Allergic rhinitis due to pollen: Secondary | ICD-10-CM | POA: Diagnosis not present

## 2021-03-27 DIAGNOSIS — G4733 Obstructive sleep apnea (adult) (pediatric): Secondary | ICD-10-CM

## 2021-03-27 DIAGNOSIS — M4716 Other spondylosis with myelopathy, lumbar region: Secondary | ICD-10-CM | POA: Diagnosis not present

## 2021-03-27 DIAGNOSIS — E782 Mixed hyperlipidemia: Secondary | ICD-10-CM | POA: Diagnosis not present

## 2021-03-27 DIAGNOSIS — Z79891 Long term (current) use of opiate analgesic: Secondary | ICD-10-CM

## 2021-03-27 DIAGNOSIS — I10 Essential (primary) hypertension: Secondary | ICD-10-CM | POA: Diagnosis not present

## 2021-03-27 MED ORDER — TRIAMCINOLONE ACETONIDE 40 MG/ML IJ SUSP
80.0000 mg | Freq: Once | INTRAMUSCULAR | Status: AC
  Administered 2021-03-27: 80 mg via INTRAMUSCULAR

## 2021-03-27 NOTE — Progress Notes (Signed)
Subjective:  Patient ID: Kevin Pena, male    DOB: May 31, 1966  Age: 55 y.o. MRN: [id number]  Chief Complaint  Patient presents with  . allergies: last week sneezing, watery eyes  . Hyperlipidemia    HPI: chronic visit  Patient presents for follow up of hypertension.  Patient tolerating hctz well with side effects.  Patient was diagnosed with hypertension 2010 so has been treated for hypertension for 10 years.Patient is working on maintaining diet and exercise regimen and follows up as directed. Complication include none  Patient presents with hyperlipidemia.  Compliance with treatment has been good; patient takes medicines as directed, maintains low cholesterol diet, follows up as directed, and maintains exercise regimen.  Patient is using atorvastatin without problems.Marland Kitchen  HIV doing well with negative HIV particles, he sees [facility] and is on medicines.     Current Outpatient Medications on File Prior to Visit  Medication Sig Dispense Refill  . atorvastatin (LIPITOR) 20 MG tablet TAKE 1 TABLET BY MOUTH EVERY DAY 90 tablet 2  . diazepam (VALIUM) 5 MG tablet Take 10 mg by mouth at bedtime. scheduled    . esomeprazole (NEXIUM) 40 MG capsule TAKE 1 CAPSULE BY MOUTH TWICE A DAY 60 capsule 8  . fluticasone (FLONASE) 50 MCG/ACT nasal spray SPRAY 1 SPRAY INTO EACH NOSTRIL EVERY DAY AS NEEDED FOR ALLERGIES  6  . hydrochlorothiazide (HYDRODIURIL) 25 MG tablet Take 25 mg by mouth every evening.     . linaclotide (LINZESS) 72 MCG capsule Take 1 capsule (72 mcg total) by mouth daily before breakfast. 30 capsule 6  . meloxicam (MOBIC) 15 MG tablet Take 15 mg by mouth daily.    . ODEFSEY 200-25-25 MG TABS tablet Take 1 tablet by mouth every morning.    Marland Kitchen oxyCODONE (ROXICODONE) 15 MG immediate release tablet Take 1 tablet (15 mg total) by mouth every 6 (six) hours as needed for pain. 120 tablet 0  . PROAIR HFA 108 (90 BASE) MCG/ACT inhaler Inhale 2 puffs into the lungs every 6 (six) hours as  needed for wheezing or shortness of breath.   11  . valACYclovir (VALTREX) 500 MG tablet Take 1 tablet by mouth daily as needed (outbreaks).      No current facility-administered medications on file prior to visit.   Past Medical History:  Diagnosis Date  . Arthritis   . GERD (gastroesophageal reflux disease)   . Internal hemorrhoids   . Long-term current use of opiate analgesic 03/14/2020  . Presence of tooth-root and mandibular implants 05/29/2020  . Rash left leg-- using topical cream  . Spondylosis with myelopathy, 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   Past Surgical History:  Procedure Laterality Date  . APPENDECTOMY  1981  . CHOLECYSTECTOMY  2006  . dental implants  11/2020  . FOOT SURGERY    . HAMMER TOE SURGERY  OCT 2010  . INTERSTIM IMPLANT PLACEMENT  06/17/2012   Procedure: Leane Platt IMPLANT FIRST STAGE;  Surgeon: Martina Sinner, MD;  Location[facility];  Service: Urology;  Laterality: N/A;  RAD TECH OK PER VICKIE AT MAIN OR   . INTERSTIM IMPLANT PLACEMENT  06/17/2012   Procedure: Leane Platt IMPLANT SECOND STAGE;  Surgeon: Martina Sinner, MD;  Location: [facility];  Service: Urology;  Laterality: N/A;  . MOUTH SURGERY  02/16/2020  . REPAIR RECURRENT RIGHT INGUINAL HERNIA  2000  . RIGHT INGUINAL HERNIA REPAIR  1997  . UMBILICAL HERNIA REPAIR  2010    Family History  Problem Relation  Age of Onset  . Other Mother        colon resection, tumor near liver  . Diabetes Mother   . Hyperlipidemia Mother   . Hypertension Mother   . Varicose Veins Mother   . Heart Problems Father   . Diabetes Father   . Heart disease Father   . Hyperlipidemia Father   . Hypertension Father   . Pancreatic cancer Neg Hx   . Rectal cancer Neg Hx   . Stomach cancer Neg Hx   . Colon cancer Neg Hx    Social History   Socioeconomic History  . Marital status: Significant Other    Spouse name: [name]  . Number of children: 1  . Years of education: College   . Highest education level: Not on file  Occupational History    Employer: OTHER    Comment: disability  Tobacco Use  . Smoking status: Former Smoker    Packs/day: 1.00    Years: 30.00    Pack years: 30.00    Types: Cigarettes    Quit date: 06/11/2010    Years since quitting: 10.8  . Smokeless tobacco: Never Used  Vaping Use  . Vaping Use: Some days  Substance and Sexual Activity  . Alcohol use: Yes    Alcohol/week: 2.0 standard drinks    Types: 2 Cans of beer per week    Comment: rarely  . Drug use: No  . Sexual activity: Not Currently    Partners: Male  Other Topics Concern  . Not on file  Social History Narrative   Patient lives at home with partner.   Caffeine Use: 32oz daily coffee/tea   Social Determinants of Corporate investment banker Strain: Not on file  Food Insecurity: Not on file  Transportation Needs: Not on file  Physical Activity: Not on file  Stress: Not on file  Social Connections: Not on file    Review of Systems  Constitutional: Negative for activity change and appetite change.  HENT: Negative for congestion and rhinorrhea.   Eyes: Negative for visual disturbance.  Respiratory: Negative for chest tightness and shortness of breath.   Cardiovascular: Negative for chest pain, palpitations and leg swelling.  Gastrointestinal: Negative for abdominal distention and abdominal pain.  Endocrine: Negative for polyuria.  Genitourinary: Negative for difficulty urinating, dysuria and urgency.  Musculoskeletal: Positive for back pain. Negative for arthralgias.  Skin: Negative.   Neurological: Negative.   Psychiatric/Behavioral: Negative.      Objective:  BP 106/78   Pulse 64   Temp (!) 97.3 F (36.3 C)   Resp 18   Ht 5\' 10"  (1.778 m)   Wt 185 lb (83.9 kg)   SpO2 96%   BMI 26.54 kg/m   BP/Weight 03/27/2021 02/06/2021 12/25/2020  Systolic BP 106 112 112  Diastolic BP 78 80 70  Wt. (Lbs) 185 198 197.8  BMI 26.54 28.41 28.38    Physical  Exam Vitals reviewed.  Constitutional:      General: He is not in acute distress.    Appearance: Normal appearance.  HENT:     Head: Normocephalic.     Right Ear: Tympanic membrane, ear canal and external ear normal.     Left Ear: Tympanic membrane, ear canal and external ear normal.     Mouth/Throat:     Mouth: Mucous membranes are moist.     Pharynx: Oropharynx is clear.  Eyes:     Extraocular Movements: Extraocular movements intact.     Conjunctiva/sclera: Conjunctivae  normal.     Pupils: Pupils are equal, round, and reactive to light.  Cardiovascular:     Rate and Rhythm: Normal rate and regular rhythm.     Pulses: Normal pulses.     Heart sounds: Normal heart sounds. No murmur heard. No gallop.   Pulmonary:     Effort: Pulmonary effort is normal. No respiratory distress.     Breath sounds: Normal breath sounds. No rales.  Abdominal:     General: Abdomen is flat. Bowel sounds are normal. There is no distension.     Tenderness: There is no abdominal tenderness.  Musculoskeletal:        General: Tenderness (back) present.     Cervical back: Normal range of motion.  Skin:    General: Skin is warm.     Capillary Refill: Capillary refill takes less than 2 seconds.  Neurological:     General: No focal deficit present.     Mental Status: He is alert and oriented to person, place, and time. Mental status is at baseline.  Psychiatric:        Mood and Affect: Mood normal.        Thought Content: Thought content normal.       Lab Results  Component Value Date   WBC 9.6 12/25/2020   HGB 15.0 12/25/2020   HCT 43.5 12/25/2020   PLT 233 12/25/2020   GLUCOSE 81 12/25/2020   CHOL 160 12/25/2020   TRIG 76 12/25/2020   HDL 45 12/25/2020   LDLCALC 100 (H) 12/25/2020   ALT 15 12/25/2020   AST 19 12/25/2020   NA 138 12/25/2020   K 4.4 12/25/2020   CL 100 12/25/2020   CREATININE 1.40 (H) 12/25/2020   BUN 16 12/25/2020   CO2 26 12/25/2020      Assessment & Plan:    Diagnoses and all orders for this visit: Primary hypertension -     CBC with Differential/Platelet -     Comprehensive metabolic panel An individual hypertension care plan was established and reinforced today.  The patient's status was assessed using clinical findings on exam and labs or diagnostic tests. The patient's success at meeting treatment goals on disease specific evidence-based guidelines and found to be fair controlled. SELF MANAGEMENT: The patient and I together assessed ways to personally work towards obtaining the recommended goals. RECOMMENDATIONS: avoid decongestants found in common cold remedies, decrease consumption of alcohol, perform routine monitoring of BP with home BP cuff, exercise, reduction of dietary salt, take medicines as prescribed, try not to miss doses and quit smoking.  Regular exercise and maintaining a healthy weight is needed.  Stress reduction may help. A CLINICAL SUMMARY including written plan identify barriers to care unique to individual due to social or financial issues.  We attempt to mutually creat solutions for individual and family understanding.  Mild obstructive sleep apnea Using CPAP consistently every night and medically benefiting from its use.  Seasonal allergic rhinitis due to pollen Patient has chronic allergic rhinitis worse with spring pollen, kenalog 80mg  IM  Gastroesophageal reflux disease with esophagitis without hemorrhage Plan of care was formulated today.  he is doing well.  A plan of care was formulated using patient exam, tests and other sources to optimize care using evidence based information.  Recommend no smoking, no eating after supper, avoid fatty foods, elevate Head of bed, avoid tight fitting clothing.  Continue on nexium . Spondylosis with myelopathy, lumbar region AN INDIVIDUAL CARE PLAN lumbar spojndylosis was established and reinforced  today.  The patient's status was assessed using clinical findings on exam, labs, and  other diagnostic testing. Patient's success at meeting treatment goals based on disease specific evidence-bassed guidelines and found to be in fair control. RECOMMENDATIONS include maintaining present medicines and treatment.  Long-term current use of opiate analgesic AN INDIVIDUAL CARE PLAN was established and reinforced today.  The patient's status was assessed using clinical findings on exam, labs, and other diagnostic testing. Patient's success at meeting treatment goals based on disease specific evidence-bassed guidelines and found to be in fair control. RECOMMENDATIONS include maintining present medicines and treatment. He is on chronicoxycodone with no abuse.  Negative REMS.  Mixed hyperlipidemia -     Lipid panel AN INDIVIDUAL CARE PLAN for hyperlipidemia/ cholesterol was established and reinforced today.  The patient's status was assessed using clinical findings on exam, lab and other diagnostic tests. The patient's disease status was assessed based on evidence-based guidelines and found to be well controlled. MEDICATIONS were reviewed. SELF MANAGEMENT GOALS have been discussed and patient's success at attaining the goal of low cholesterol was assessed. RECOMMENDATION given include regular exercise 3 days a week and low cholesterol/low fat diet. CLINICAL SUMMARY including written plan to identify barriers unique to the patient due to social or economic  reasons was discussed.   30 minute visit    Follow-up: No follow-ups on file.  An After Visit Summary was printed and given to the patient.  Brent BullaLawrence Fancy Dunkley, MD [facility] [phone]

## 2021-03-28 LAB — COMPREHENSIVE METABOLIC PANEL
ALT: 16 IU/L (ref 0–44)
AST: 22 IU/L (ref 0–40)
Albumin/Globulin Ratio: 2 (ref 1.2–2.2)
Albumin: 4.2 g/dL (ref 3.8–4.9)
Alkaline Phosphatase: 75 IU/L (ref 44–121)
BUN/Creatinine Ratio: 16 (ref 9–20)
BUN: 21 mg/dL (ref 6–24)
Bilirubin Total: 0.3 mg/dL (ref 0.0–1.2)
CO2: 21 mmol/L (ref 20–29)
Calcium: 9.2 mg/dL (ref 8.7–10.2)
Chloride: 102 mmol/L (ref 96–106)
Creatinine, Ser: 1.28 mg/dL — ABNORMAL HIGH (ref 0.76–1.27)
Globulin, Total: 2.1 g/dL (ref 1.5–4.5)
Glucose: 93 mg/dL (ref 65–99)
Potassium: 4.4 mmol/L (ref 3.5–5.2)
Sodium: 139 mmol/L (ref 134–144)
Total Protein: 6.3 g/dL (ref 6.0–8.5)
eGFR: 65 mL/min/{1.73_m2} (ref >59)

## 2021-03-28 LAB — CBC WITH DIFFERENTIAL/PLATELET
Basophils Absolute: 0.1 10*3/uL (ref 0.0–0.2)
Basos: 1 %
EOS (ABSOLUTE): 0.2 10*3/uL (ref 0.0–0.4)
Eos: 2 %
Hematocrit: 44.7 % (ref 37.5–51.0)
Hemoglobin: 15 g/dL (ref 13.0–17.7)
Immature Grans (Abs): 0 10*3/uL (ref 0.0–0.1)
Immature Granulocytes: 0 %
Lymphocytes Absolute: 2.6 10*3/uL (ref 0.7–3.1)
Lymphs: 27 %
MCH: 31.5 pg (ref 26.6–33.0)
MCHC: 33.6 g/dL (ref 31.5–35.7)
MCV: 94 fL (ref 79–97)
Monocytes Absolute: 1 10*3/uL — ABNORMAL HIGH (ref 0.1–0.9)
Monocytes: 11 %
Neutrophils Absolute: 5.6 10*3/uL (ref 1.4–7.0)
Neutrophils: 59 %
Platelets: 229 10*3/uL (ref 150–450)
RBC: 4.76 x10E6/uL (ref 4.14–5.80)
RDW: 12.9 % (ref 11.6–15.4)
WBC: 9.4 10*3/uL (ref 3.4–10.8)

## 2021-03-28 LAB — CARDIOVASCULAR RISK ASSESSMENT

## 2021-03-28 LAB — LIPID PANEL
Chol/HDL Ratio: 3.5 ratio (ref 0.0–5.0)
Cholesterol, Total: 160 mg/dL (ref 100–199)
HDL: 46 mg/dL (ref >39)
LDL Chol Calc (NIH): 95 mg/dL (ref 0–99)
Triglycerides: 105 mg/dL (ref 0–149)
VLDL Cholesterol Cal: 19 mg/dL (ref 5–40)

## 2021-03-28 NOTE — Progress Notes (Signed)
CBC normal, creatinine remains 1.26, liver tests normal, LDL cholesterol 100,  lp

## 2021-04-11 ENCOUNTER — Other Ambulatory Visit: Payer: Self-pay

## 2021-04-11 DIAGNOSIS — M4716 Other spondylosis with myelopathy, lumbar region: Secondary | ICD-10-CM

## 2021-04-11 MED ORDER — OXYCODONE HCL 15 MG PO TABS: 15.0000 mg | ORAL_TABLET | Freq: Four times a day (QID) | ORAL | 0 refills | Status: DC | PRN

## 2021-05-13 ENCOUNTER — Other Ambulatory Visit: Payer: Self-pay

## 2021-05-13 DIAGNOSIS — M4716 Other spondylosis with myelopathy, lumbar region: Secondary | ICD-10-CM

## 2021-05-13 MED ORDER — OXYCODONE HCL 15 MG PO TABS: 15.0000 mg | ORAL_TABLET | Freq: Four times a day (QID) | ORAL | 0 refills | Status: DC | PRN

## 2021-06-13 ENCOUNTER — Other Ambulatory Visit: Payer: Self-pay

## 2021-06-13 DIAGNOSIS — M4716 Other spondylosis with myelopathy, lumbar region: Secondary | ICD-10-CM

## 2021-06-13 MED ORDER — OXYCODONE HCL 15 MG PO TABS: 15.0000 mg | ORAL_TABLET | Freq: Four times a day (QID) | ORAL | 0 refills | Status: DC | PRN

## 2021-07-01 ENCOUNTER — Ambulatory Visit: Payer: Managed Care, Other (non HMO) | Admitting: Legal Medicine

## 2021-07-01 DIAGNOSIS — M4716 Other spondylosis with myelopathy, lumbar region: Secondary | ICD-10-CM

## 2021-07-01 DIAGNOSIS — Z21 Asymptomatic human immunodeficiency virus [HIV] infection status: Secondary | ICD-10-CM

## 2021-07-01 DIAGNOSIS — K2100 Gastro-esophageal reflux disease with esophagitis, without bleeding: Secondary | ICD-10-CM

## 2021-07-01 DIAGNOSIS — G4733 Obstructive sleep apnea (adult) (pediatric): Secondary | ICD-10-CM

## 2021-07-01 DIAGNOSIS — Z79891 Long term (current) use of opiate analgesic: Secondary | ICD-10-CM

## 2021-07-01 DIAGNOSIS — I10 Essential (primary) hypertension: Secondary | ICD-10-CM

## 2021-07-01 DIAGNOSIS — E782 Mixed hyperlipidemia: Secondary | ICD-10-CM

## 2021-07-04 ENCOUNTER — Other Ambulatory Visit: Payer: Self-pay

## 2021-07-04 ENCOUNTER — Ambulatory Visit (INDEPENDENT_AMBULATORY_CARE_PROVIDER_SITE_OTHER): Payer: Managed Care, Other (non HMO) | Admitting: Legal Medicine

## 2021-07-04 ENCOUNTER — Encounter: Payer: Self-pay | Admitting: Legal Medicine

## 2021-07-04 VITALS — BP 130/60 | HR 63 | Temp 97.6°F | Resp 16 | Ht 70.0 in | Wt 181.0 lb

## 2021-07-04 DIAGNOSIS — Z79891 Long term (current) use of opiate analgesic: Secondary | ICD-10-CM

## 2021-07-04 DIAGNOSIS — K2100 Gastro-esophageal reflux disease with esophagitis, without bleeding: Secondary | ICD-10-CM

## 2021-07-04 DIAGNOSIS — Z21 Asymptomatic human immunodeficiency virus [HIV] infection status: Secondary | ICD-10-CM

## 2021-07-04 DIAGNOSIS — E782 Mixed hyperlipidemia: Secondary | ICD-10-CM | POA: Diagnosis not present

## 2021-07-04 DIAGNOSIS — I10 Essential (primary) hypertension: Secondary | ICD-10-CM

## 2021-07-04 DIAGNOSIS — G629 Polyneuropathy, unspecified: Secondary | ICD-10-CM | POA: Diagnosis not present

## 2021-07-04 DIAGNOSIS — G4733 Obstructive sleep apnea (adult) (pediatric): Secondary | ICD-10-CM | POA: Diagnosis not present

## 2021-07-04 DIAGNOSIS — R69 Illness, unspecified: Secondary | ICD-10-CM | POA: Diagnosis not present

## 2021-07-04 NOTE — Progress Notes (Signed)
Established Patient Office Visit  Subjective:  Patient ID: Kevin Pena, male    DOB: [birth year]  Age: 55 y.o. MRN: [id number]  CC:  Chief Complaint  Patient presents with   Hyperlipidemia   Gastroesophageal Reflux   Hypertension    HPI Kevin Pena presents for chronic visit  Patient presents for follow up of hypertension.  Patient tolerating HCTZ well with side effects.  Patient was diagnosed with hypertension 2010 so has been treated for hypertension for 10 years.Patient is working on maintaining diet and exercise regimen and follows up as directed. Complication include none.   Patient presents with hyperlipidemia.  Compliance with treatment has been good; patient takes medicines as directed, maintains low cholesterol diet, follows up as directed, and maintains exercise regimen.  Patient is using atorvastatin without problems.   Patient has gastroesophageal reflux symptoms with esophagitis and LTRD.  The symptoms are moderate intensity.  Length of symptoms 10 years.  Medicines include omeprazole.  Complications include none.   Patient has HIV and is seen at ID  Past Medical History:  Diagnosis Date   Arthritis    GERD (gastroesophageal reflux disease)    Internal hemorrhoids    Long-term current use of opiate analgesic 03/14/2020   Presence of tooth-root and mandibular implants 05/29/2020   Rash left leg-- using topical cream   Spondylosis with myelopathy, 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    Past Surgical History:  Procedure Laterality Date   APPENDECTOMY  1981   CHOLECYSTECTOMY  2006   dental implants  11/2020   FOOT SURGERY     HAMMER TOE SURGERY  OCT 2010   INTERSTIM IMPLANT PLACEMENT  06/17/2012   Procedure: Barrie Lyme IMPLANT FIRST STAGE;  Surgeon: Reece Packer, MD;  Location: [facility];  Service: Urology;  Laterality: N/A;  RAD TECH OK PER VICKIE AT MAIN OR    INTERSTIM IMPLANT PLACEMENT  06/17/2012   Procedure: INTERSTIM IMPLANT SECOND  STAGE;  Surgeon: Reece Packer, MD;  Location: [facility];  Service: Urology;  Laterality: N/A;   MOUTH SURGERY  02/16/2020   REPAIR RECURRENT RIGHT INGUINAL HERNIA  2000   RIGHT INGUINAL HERNIA REPAIR  0737   UMBILICAL HERNIA REPAIR  2010    Family History  Problem Relation Age of Onset   Other Mother        colon resection, tumor near liver   Diabetes Mother    Hyperlipidemia Mother    Hypertension Mother    Varicose Veins Mother    Heart Problems Father    Diabetes Father    Heart disease Father    Hyperlipidemia Father    Hypertension Father    Pancreatic cancer Neg Hx    Rectal cancer Neg Hx    Stomach cancer Neg Hx    Colon cancer Neg Hx     Social History   Socioeconomic History   Marital status: Significant Other    Spouse name: Huntley Dec   Number of children: 1   Years of education: College   Highest education level: Not on file  Occupational History    Employer: OTHER    Comment: disability  Tobacco Use   Smoking status: Former    Packs/day: 1.00    Years: 30.00    Pack years: 30.00    Types: Cigarettes    Quit date: 06/11/2010    Years since quitting: 11.0   Smokeless tobacco: Never  Vaping Use   Vaping Use: Some days  Substance and Sexual Activity  Alcohol use: Yes    Alcohol/week: 2.0 standard drinks    Types: 2 Cans of beer per week    Comment: rarely   Drug use: No   Sexual activity: Not Currently    Partners: Male  Other Topics Concern   Not on file  Social History Narrative   Patient lives at home with partner.   Caffeine Use: 32oz daily coffee/tea   Social Determinants of Radio broadcast assistant Strain: Not on file  Food Insecurity: Not on file  Transportation Needs: Not on file  Physical Activity: Not on file  Stress: Not on file  Social Connections: Not on file  Intimate Partner Violence: Not on file    Outpatient Medications Prior to Visit  Medication Sig Dispense Refill   atorvastatin  (LIPITOR) 20 MG tablet TAKE 1 TABLET BY MOUTH EVERY DAY 90 tablet 2   diazepam (VALIUM) 5 MG tablet Take 10 mg by mouth at bedtime. scheduled     esomeprazole (NEXIUM) 40 MG capsule TAKE 1 CAPSULE BY MOUTH TWICE A DAY 60 capsule 8   fluticasone (FLONASE) 50 MCG/ACT nasal spray SPRAY 1 SPRAY INTO EACH NOSTRIL EVERY DAY AS NEEDED FOR ALLERGIES  6   hydrochlorothiazide (HYDRODIURIL) 25 MG tablet Take 25 mg by mouth every evening.      linaclotide (LINZESS) 72 MCG capsule Take 1 capsule (72 mcg total) by mouth daily before breakfast. 30 capsule 6   meloxicam (MOBIC) 15 MG tablet Take 15 mg by mouth daily.     ODEFSEY 200-25-25 MG TABS tablet Take 1 tablet by mouth every morning.     oxyCODONE (ROXICODONE) 15 MG immediate release tablet Take 1 tablet (15 mg total) by mouth every 6 (six) hours as needed for pain. 120 tablet 0   PROAIR HFA 108 (90 BASE) MCG/ACT inhaler Inhale 2 puffs into the lungs every 6 (six) hours as needed for wheezing or shortness of breath.   11   valACYclovir (VALTREX) 500 MG tablet Take 1 tablet by mouth daily as needed (outbreaks).      No facility-administered medications prior to visit.    Allergies  Allergen Reactions   Adhesive [Tape] Other (See Comments)    SEVERE IRRITAION   Ampicillin Nausea And Vomiting   Doxycycline Rash   Lyrica [Pregabalin] Nausea And Vomiting and Rash    ROS Review of Systems  Constitutional:  Negative for chills, fatigue and fever.  HENT:  Negative for congestion, ear pain and sore throat.   Respiratory:  Negative for cough and shortness of breath.   Cardiovascular:  Negative for chest pain.  Gastrointestinal:  Negative for abdominal pain, constipation, diarrhea, nausea and vomiting.  Endocrine: Negative for polydipsia, polyphagia and polyuria.  Genitourinary:  Negative for dysuria and frequency.  Musculoskeletal:  Negative for arthralgias and myalgias.  Neurological:  Positive for numbness (right leg). Negative for dizziness and  headaches.  Psychiatric/Behavioral:  Negative for dysphoric mood.        No dysphoria     Objective:    Physical Exam Vitals reviewed.  Constitutional:      Appearance: Normal appearance.  HENT:     Right Ear: Tympanic membrane, ear canal and external ear normal.     Left Ear: Tympanic membrane, ear canal and external ear normal.  Eyes:     Extraocular Movements: Extraocular movements intact.     Conjunctiva/sclera: Conjunctivae normal.     Pupils: Pupils are equal, round, and reactive to light.  Cardiovascular:  Rate and Rhythm: Normal rate and regular rhythm.     Pulses: Normal pulses.     Heart sounds: Normal heart sounds. No murmur heard.   No gallop.  Pulmonary:     Effort: Pulmonary effort is normal. No respiratory distress.     Breath sounds: Normal breath sounds. No wheezing.  Abdominal:     General: Abdomen is flat. Bowel sounds are normal. There is no distension.     Palpations: Abdomen is soft.     Tenderness: There is no abdominal tenderness.  Musculoskeletal:        General: Normal range of motion.     Cervical back: Normal range of motion.  Skin:    General: Skin is warm.     Capillary Refill: Capillary refill takes less than 2 seconds.  Neurological:     General: No focal deficit present.     Mental Status: He is alert and oriented to person, place, and time. Mental status is at baseline.     Sensory: Sensory deficit (sensation returning to right leg) present.    BP 130/60   Pulse 63   Temp 97.6 F (36.4 C)   Resp 16   Ht '5\' 10"'  (1.778 m)   Wt 181 lb (82.1 kg)   SpO2 98%   BMI 25.97 kg/m  Wt Readings from Last 3 Encounters:  07/04/21 181 lb (82.1 kg)  03/27/21 185 lb (83.9 kg)  02/06/21 198 lb (89.8 kg)     Health Maintenance Due  Topic Date Due   Zoster Vaccines- Shingrix (1 of 2) Never done   COVID-19 Vaccine (3 - Moderna risk series) 05/07/2020   INFLUENZA VACCINE  06/24/2021    There are no preventive care reminders to display  for this patient.  No results found for: TSH Lab Results  Component Value Date   WBC 9.4 03/27/2021   HGB 15.0 03/27/2021   HCT 44.7 03/27/2021   MCV 94 03/27/2021   PLT 229 03/27/2021   Lab Results  Component Value Date   NA 139 03/27/2021   K 4.4 03/27/2021   CO2 21 03/27/2021   GLUCOSE 93 03/27/2021   BUN 21 03/27/2021   CREATININE 1.28 (H) 03/27/2021   BILITOT 0.3 03/27/2021   ALKPHOS 75 03/27/2021   AST 22 03/27/2021   ALT 16 03/27/2021   PROT 6.3 03/27/2021   ALBUMIN 4.2 03/27/2021   CALCIUM 9.2 03/27/2021   ANIONGAP 8 06/23/2018   EGFR 65 03/27/2021   Lab Results  Component Value Date   CHOL 160 03/27/2021   Lab Results  Component Value Date   HDL 46 03/27/2021   Lab Results  Component Value Date   LDLCALC 95 03/27/2021   Lab Results  Component Value Date   TRIG 105 03/27/2021   Lab Results  Component Value Date   CHOLHDL 3.5 03/27/2021   No results found for: HGBA1C    Assessment & Plan:   Problem List Items Addressed This Visit       Cardiovascular and Mediastinum        Respiratory   Mild obstructive sleep apnea Using CPAP consistently every night and medically benefiting from its use.      Digestive   GERD (gastroesophageal reflux disease) Plan of care was formulated today.  he is doing well.  A plan of care was formulated using patient exam, tests and other sources to optimize care using evidence based information.  Recommend no smoking, no eating after supper, avoid fatty foods, elevate  Head of bed, avoid tight fitting clothing.  Continue on omeprazole.      Nervous and Auditory   Neuropathy Patient is having less pain in right leg.  He had regional pain syndrone that is improving.  He remains with some mild edema     Other   HIV (human immunodeficiency virus infection) ([place]) He sees ID    Mixed hyperlipidemia - Primary   Relevant Orders   Lipid panel AN INDIVIDUAL CARE PLAN for hyperlipidemia/ cholesterol was established and  reinforced today.  The patient's status was assessed using clinical findings on exam, lab and other diagnostic tests. The patient's disease status was assessed based on evidence-based guidelines and found to be fair controlled. MEDICATIONS were reviewed. SELF MANAGEMENT GOALS have been discussed and patient's success at attaining the goal of low cholesterol was assessed. RECOMMENDATION given include regular exercise 3 days a week and low cholesterol/low fat diet. CLINICAL SUMMARY including written plan to identify barriers unique to the patient due to social or economic  reasons was discussed.     Long-term current use of opiate analgesic New pain contract, drug screen ORD AN INDIVIDUAL CARE PLAN was established and reinforced today.  The patient's status was assessed using clinical findings on exam, labs, and other diagnostic testing. Patient's success at meeting treatment goals based on disease specific evidence-bassed guidelines and found to be in fair control. RECOMMENDATIONS include maintining present medicines and treatment. He is on chronic oxycodone with no abuse.  Negative REMS. Drug scree for monitoring drugs sent off   Other Visit Diagnoses     Essential hypertension       Relevant Orders   Comprehensive metabolic panel   CBC with Differential/Platelet An individual hypertension care plan was established and reinforced today.  The patient's status was assessed using clinical findings on exam and labs or diagnostic tests. The patient's success at meeting treatment goals on disease specific evidence-based guidelines and found to be well controlled. SELF MANAGEMENT: The patient and I together assessed ways to personally work towards obtaining the recommended goals. RECOMMENDATIONS: avoid decongestants found in common cold remedies, decrease consumption of alcohol, perform routine monitoring of BP with home BP cuff, exercise, reduction of dietary salt, take medicines as prescribed, try not  to miss doses and quit smoking.  Regular exercise and maintaining a healthy weight is needed.  Stress reduction may help. A CLINICAL SUMMARY including written plan identify barriers to care unique to individual due to social or financial issues.  We attempt to mutually creat solutions for individual and family understanding.          Follow-up: Return in about 3 months (around 10/04/2021) for fasting.    Reinaldo Meeker, MD

## 2021-07-05 LAB — COMPREHENSIVE METABOLIC PANEL
ALT: 14 IU/L (ref 0–44)
AST: 18 IU/L (ref 0–40)
Albumin/Globulin Ratio: 1.8 (ref 1.2–2.2)
Albumin: 4.1 g/dL (ref 3.8–4.9)
Alkaline Phosphatase: 72 IU/L (ref 44–121)
BUN/Creatinine Ratio: 18 (ref 9–20)
BUN: 21 mg/dL (ref 6–24)
Bilirubin Total: 0.3 mg/dL (ref 0.0–1.2)
CO2: 25 mmol/L (ref 20–29)
Calcium: 9.1 mg/dL (ref 8.7–10.2)
Chloride: 98 mmol/L (ref 96–106)
Creatinine, Ser: 1.18 mg/dL (ref 0.76–1.27)
Globulin, Total: 2.3 g/dL (ref 1.5–4.5)
Glucose: 94 mg/dL (ref 65–99)
Potassium: 4.6 mmol/L (ref 3.5–5.2)
Sodium: 136 mmol/L (ref 134–144)
Total Protein: 6.4 g/dL (ref 6.0–8.5)
eGFR: 72 mL/min/{1.73_m2} (ref >59)

## 2021-07-05 LAB — CBC WITH DIFFERENTIAL/PLATELET
Basophils Absolute: 0.1 10*3/uL (ref 0.0–0.2)
Basos: 1 %
EOS (ABSOLUTE): 0.2 10*3/uL (ref 0.0–0.4)
Eos: 1 %
Hematocrit: 44.3 % (ref 37.5–51.0)
Hemoglobin: 15 g/dL (ref 13.0–17.7)
Immature Grans (Abs): 0 10*3/uL (ref 0.0–0.1)
Immature Granulocytes: 0 %
Lymphocytes Absolute: 4.6 10*3/uL — ABNORMAL HIGH (ref 0.7–3.1)
Lymphs: 40 %
MCH: 32.1 pg (ref 26.6–33.0)
MCHC: 33.9 g/dL (ref 31.5–35.7)
MCV: 95 fL (ref 79–97)
Monocytes Absolute: 1.2 10*3/uL — ABNORMAL HIGH (ref 0.1–0.9)
Monocytes: 11 %
Neutrophils Absolute: 5.4 10*3/uL (ref 1.4–7.0)
Neutrophils: 47 %
Platelets: 212 10*3/uL (ref 150–450)
RBC: 4.67 x10E6/uL (ref 4.14–5.80)
RDW: 11.6 % (ref 11.6–15.4)
WBC: 11.5 10*3/uL — ABNORMAL HIGH (ref 3.4–10.8)

## 2021-07-05 LAB — LIPID PANEL
Chol/HDL Ratio: 2.9 ratio (ref 0.0–5.0)
Cholesterol, Total: 154 mg/dL (ref 100–199)
HDL: 54 mg/dL (ref >39)
LDL Chol Calc (NIH): 86 mg/dL (ref 0–99)
Triglycerides: 74 mg/dL (ref 0–149)
VLDL Cholesterol Cal: 14 mg/dL (ref 5–40)

## 2021-07-05 LAB — CARDIOVASCULAR RISK ASSESSMENT

## 2021-07-05 NOTE — Progress Notes (Signed)
Kidney and liver tests normal, creatinine 1.18 in normal range, Cholesterol normal, WBC high any sickness?,  lp

## 2021-07-09 ENCOUNTER — Other Ambulatory Visit: Payer: Managed Care, Other (non HMO)

## 2021-07-09 DIAGNOSIS — Z79891 Long term (current) use of opiate analgesic: Secondary | ICD-10-CM | POA: Diagnosis not present

## 2021-07-09 NOTE — Progress Notes (Signed)
Pt if office for UDS. Will call with results.   Kevin Pena 07/09/21 4:13 PM

## 2021-07-11 LAB — PAIN MGT SCRN (14 DRUGS), UR
Amphetamine Scrn, Ur: NEGATIVE ng/mL
BARBITURATE SCREEN URINE: NEGATIVE ng/mL
BENZODIAZEPINE SCREEN, URINE: NEGATIVE ng/mL
Buprenorphine, Urine: NEGATIVE ng/mL
CANNABINOIDS UR QL SCN: NEGATIVE ng/mL
Cocaine (Metab) Scrn, Ur: NEGATIVE ng/mL
Creatinine(Crt), U: 108.6 mg/dL (ref 20.0–300.0)
Fentanyl, Urine: NEGATIVE pg/mL
Meperidine Screen, Urine: NEGATIVE ng/mL
Methadone Screen, Urine: NEGATIVE ng/mL
OXYCODONE+OXYMORPHONE UR QL SCN: POSITIVE ng/mL — AB
Opiate Scrn, Ur: NEGATIVE ng/mL
Ph of Urine: 5.9 (ref 4.5–8.9)
Phencyclidine Qn, Ur: NEGATIVE ng/mL
Propoxyphene Scrn, Ur: NEGATIVE ng/mL
Tramadol Screen, Urine: NEGATIVE ng/mL

## 2021-07-11 NOTE — Progress Notes (Signed)
Drug tests positive for oxycodone which is what the patient is on, no other elicit drugs present- good lp

## 2021-07-15 ENCOUNTER — Other Ambulatory Visit: Payer: Self-pay

## 2021-07-15 DIAGNOSIS — M4716 Other spondylosis with myelopathy, lumbar region: Secondary | ICD-10-CM

## 2021-07-15 MED ORDER — OXYCODONE HCL 15 MG PO TABS: 15.0000 mg | ORAL_TABLET | Freq: Four times a day (QID) | ORAL | 0 refills | Status: DC | PRN

## 2021-08-13 ENCOUNTER — Other Ambulatory Visit: Payer: Self-pay

## 2021-08-13 DIAGNOSIS — M4716 Other spondylosis with myelopathy, lumbar region: Secondary | ICD-10-CM

## 2021-08-13 MED ORDER — OXYCODONE HCL 15 MG PO TABS: 15.0000 mg | ORAL_TABLET | Freq: Four times a day (QID) | ORAL | 0 refills | Status: DC | PRN

## 2021-08-14 ENCOUNTER — Other Ambulatory Visit: Payer: Self-pay | Admitting: Legal Medicine

## 2021-09-12 ENCOUNTER — Other Ambulatory Visit: Payer: Self-pay

## 2021-09-12 DIAGNOSIS — M4716 Other spondylosis with myelopathy, lumbar region: Secondary | ICD-10-CM

## 2021-09-12 MED ORDER — OXYCODONE HCL 15 MG PO TABS: 15.0000 mg | ORAL_TABLET | Freq: Four times a day (QID) | ORAL | 0 refills | Status: DC | PRN

## 2021-09-30 DIAGNOSIS — A6000 Herpesviral infection of urogenital system, unspecified: Secondary | ICD-10-CM | POA: Diagnosis not present

## 2021-09-30 DIAGNOSIS — K589 Irritable bowel syndrome without diarrhea: Secondary | ICD-10-CM | POA: Diagnosis not present

## 2021-09-30 DIAGNOSIS — Z21 Asymptomatic human immunodeficiency virus [HIV] infection status: Secondary | ICD-10-CM | POA: Diagnosis not present

## 2021-09-30 DIAGNOSIS — W19XXXD Unspecified fall, subsequent encounter: Secondary | ICD-10-CM | POA: Diagnosis not present

## 2021-09-30 DIAGNOSIS — R69 Illness, unspecified: Secondary | ICD-10-CM | POA: Diagnosis not present

## 2021-09-30 DIAGNOSIS — F411 Generalized anxiety disorder: Secondary | ICD-10-CM | POA: Diagnosis not present

## 2021-09-30 DIAGNOSIS — Z79899 Other long term (current) drug therapy: Secondary | ICD-10-CM | POA: Diagnosis not present

## 2021-09-30 DIAGNOSIS — G4730 Sleep apnea, unspecified: Secondary | ICD-10-CM | POA: Diagnosis not present

## 2021-09-30 DIAGNOSIS — G629 Polyneuropathy, unspecified: Secondary | ICD-10-CM | POA: Diagnosis not present

## 2021-09-30 DIAGNOSIS — I10 Essential (primary) hypertension: Secondary | ICD-10-CM | POA: Diagnosis not present

## 2021-09-30 DIAGNOSIS — Z23 Encounter for immunization: Secondary | ICD-10-CM | POA: Diagnosis not present

## 2021-10-10 ENCOUNTER — Ambulatory Visit: Payer: Managed Care, Other (non HMO) | Admitting: Legal Medicine

## 2021-10-11 ENCOUNTER — Other Ambulatory Visit: Payer: Self-pay | Admitting: Legal Medicine

## 2021-10-15 ENCOUNTER — Other Ambulatory Visit: Payer: Self-pay

## 2021-10-15 DIAGNOSIS — M4716 Other spondylosis with myelopathy, lumbar region: Secondary | ICD-10-CM

## 2021-10-15 MED ORDER — OXYCODONE HCL 15 MG PO TABS: 15.0000 mg | ORAL_TABLET | Freq: Four times a day (QID) | ORAL | 0 refills | Status: DC | PRN

## 2021-10-22 ENCOUNTER — Ambulatory Visit (INDEPENDENT_AMBULATORY_CARE_PROVIDER_SITE_OTHER): Payer: Managed Care, Other (non HMO) | Admitting: Legal Medicine

## 2021-10-22 ENCOUNTER — Encounter: Payer: Self-pay | Admitting: Legal Medicine

## 2021-10-22 ENCOUNTER — Other Ambulatory Visit: Payer: Self-pay

## 2021-10-22 VITALS — BP 128/64 | HR 66 | Temp 97.4°F | Resp 18 | Ht 70.0 in | Wt 185.0 lb

## 2021-10-22 DIAGNOSIS — E782 Mixed hyperlipidemia: Secondary | ICD-10-CM | POA: Diagnosis not present

## 2021-10-22 DIAGNOSIS — Z79891 Long term (current) use of opiate analgesic: Secondary | ICD-10-CM | POA: Diagnosis not present

## 2021-10-22 DIAGNOSIS — Z21 Asymptomatic human immunodeficiency virus [HIV] infection status: Secondary | ICD-10-CM | POA: Diagnosis not present

## 2021-10-22 DIAGNOSIS — R69 Illness, unspecified: Secondary | ICD-10-CM | POA: Diagnosis not present

## 2021-10-22 DIAGNOSIS — I10 Essential (primary) hypertension: Secondary | ICD-10-CM | POA: Diagnosis not present

## 2021-10-22 DIAGNOSIS — Z1211 Encounter for screening for malignant neoplasm of colon: Secondary | ICD-10-CM

## 2021-10-22 NOTE — Progress Notes (Signed)
New Patient Office Visit  Subjective:  Patient ID: Kevin Pena, male    DOB: 22-Sep-1966  Age: 55 y.o. MRN: [id number]  CC:  Chief Complaint  Patient presents with   Hyperlipidemia   Hypertension    HPI Kevin Pena presents for chronic visit  Patient presents for follow up of hypertension.  Patient tolerating HCTZ well with side effects.  Patient was diagnosed with hypertension 2010 so has been treated for hypertension for 12 years.Patient is working on maintaining diet and exercise regimen and follows up as directed. Complication include none.   Patient presents with hyperlipidemia.  Compliance with treatment has been good; patient takes medicines as directed, maintains low cholesterol diet, follows up as directed, and maintains exercise regimen.  Patient is using atorvastatin without problems.   HIV stable  Past Medical History:  Diagnosis Date   Arthritis    GERD (gastroesophageal reflux disease)    Internal hemorrhoids    Long-term current use of opiate analgesic 03/14/2020   Presence of tooth-root and mandibular implants 05/29/2020   Rash left leg-- using topical cream   Spondylosis with myelopathy, 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    Past Surgical History:  Procedure Laterality Date   APPENDECTOMY  1981   CHOLECYSTECTOMY  2006   dental implants  11/2020   FOOT SURGERY     HAMMER TOE SURGERY  OCT 2010   INTERSTIM IMPLANT PLACEMENT  06/17/2012   Procedure: Leane Platt IMPLANT FIRST STAGE;  Surgeon: Martina Sinner, MD;  Location: [facility];  Service: Urology;  Laterality: N/A;  RAD TECH OK PER VICKIE AT MAIN OR    INTERSTIM IMPLANT PLACEMENT  06/17/2012   Procedure: INTERSTIM IMPLANT SECOND STAGE;  Surgeon: Martina Sinner, MD;  Location: [facility];  Service: Urology;  Laterality: N/A;   MOUTH SURGERY  02/16/2020   REPAIR RECURRENT RIGHT INGUINAL HERNIA  2000   RIGHT INGUINAL HERNIA REPAIR  1997   UMBILICAL HERNIA REPAIR   2010    Family History  Problem Relation Age of Onset   Other Mother        colon resection, tumor near liver   Diabetes Mother    Hyperlipidemia Mother    Hypertension Mother    Varicose Veins Mother    Heart Problems Father    Diabetes Father    Heart disease Father    Hyperlipidemia Father    Hypertension Father    Pancreatic cancer Neg Hx    Rectal cancer Neg Hx    Stomach cancer Neg Hx    Colon cancer Neg Hx     Social History   Socioeconomic History   Marital status: Significant Other    Spouse name: [name]   Number of children: 1   Years of education: College   Highest education level: Not on file  Occupational History    Employer: OTHER    Comment: disability  Tobacco Use   Smoking status: Former    Packs/day: 1.00    Years: 30.00    Pack years: 30.00    Types: Cigarettes    Quit date: 06/11/2010    Years since quitting: 11.3   Smokeless tobacco: Never  Vaping Use   Vaping Use: Some days  Substance and Sexual Activity   Alcohol use: Yes    Alcohol/week: 2.0 standard drinks    Types: 2 Cans of beer per week    Comment: rarely   Drug use: No   Sexual activity: Not Currently  Partners: Male  Other Topics Concern   Not on file  Social History Narrative   Patient lives at home with partner.   Caffeine Use: 32oz daily coffee/tea   Social Determinants of Radio broadcast assistant Strain: Not on file  Food Insecurity: Not on file  Transportation Needs: Not on file  Physical Activity: Not on file  Stress: Not on file  Social Connections: Not on file  Intimate Partner Violence: Not on file    ROS Review of Systems  Constitutional:  Negative for activity change and appetite change.  HENT:  Negative for congestion.   Eyes:  Negative for visual disturbance.  Respiratory:  Negative for chest tightness and shortness of breath.   Cardiovascular:  Negative for chest pain, palpitations and leg swelling.  Gastrointestinal:  Negative for  abdominal distention and abdominal pain.  Endocrine: Negative for polyuria.  Genitourinary:  Negative for difficulty urinating and dysuria.  Musculoskeletal:  Negative for arthralgias and back pain.  Neurological: Negative.   Psychiatric/Behavioral: Negative.     Objective:   Today's Vitals: BP 128/64   Pulse 66   Temp (!) 97.4 F (36.3 C)   Resp 18   Ht 5\' 10"  (1.778 m)   Wt 185 lb (83.9 kg)   SpO2 95%   BMI 26.54 kg/m   Physical Exam Vitals reviewed.  Constitutional:      General: He is not in acute distress.    Appearance: Normal appearance.  HENT:     Right Ear: Tympanic membrane normal.     Left Ear: Tympanic membrane normal.     Mouth/Throat:     Mouth: Mucous membranes are moist.  Eyes:     Extraocular Movements: Extraocular movements intact.     Conjunctiva/sclera: Conjunctivae normal.     Pupils: Pupils are equal, round, and reactive to light.  Cardiovascular:     Rate and Rhythm: Normal rate and regular rhythm.     Pulses: Normal pulses.     Heart sounds: Normal heart sounds. No murmur heard.   No gallop.  Pulmonary:     Effort: Pulmonary effort is normal. No respiratory distress.     Breath sounds: Normal breath sounds. No wheezing or rales.  Abdominal:     General: Abdomen is flat. Bowel sounds are normal. There is no distension.     Palpations: Abdomen is soft.     Tenderness: There is no abdominal tenderness.  Musculoskeletal:     Cervical back: Normal range of motion and neck supple.     Right lower leg: No edema.     Left lower leg: No edema.  Skin:    General: Skin is warm.     Capillary Refill: Capillary refill takes less than 2 seconds.  Neurological:     General: No focal deficit present.     Mental Status: He is alert and oriented to person, place, and time. Mental status is at baseline.     Gait: Gait normal.     Deep Tendon Reflexes: Reflexes normal.     Comments: Still pain and hair loss right lower leg, less edema    Assessment &  Plan:   Problem List Items Addressed This Visit       Cardiovascular and Mediastinum   BP (high blood pressure)   Relevant Orders   CBC with Differential/Platelet   Comprehensive metabolic panel An individual hypertension care plan was established and reinforced today.  The patient's status was assessed using clinical findings on exam and  labs or diagnostic tests. The patient's success at meeting treatment goals on disease specific evidence-based guidelines and found to be well controlled. SELF MANAGEMENT: The patient and I together assessed ways to personally work towards obtaining the recommended goals. RECOMMENDATIONS: avoid decongestants found in common cold remedies, decrease consumption of alcohol, perform routine monitoring of BP with home BP cuff, exercise, reduction of dietary salt, take medicines as prescribed, try not to miss doses and quit smoking.  Regular exercise and maintaining a healthy weight is needed.  Stress reduction may help. A CLINICAL SUMMARY including written plan identify barriers to care unique to individual due to social or financial issues.  We attempt to mutually creat solutions for individual and family understanding.      Other   HIV (human immunodeficiency virus infection) ([place]) - Primary Patient is HIV negative by pcr, follows up with ID    Mixed hyperlipidemia   Relevant Orders   Lipid panel AN INDIVIDUAL CARE PLAN for hyperlipidemia/ cholesterol was established and reinforced today.  The patient's status was assessed using clinical findings on exam, lab and other diagnostic tests. The patient's disease status was assessed based on evidence-based guidelines and found to be well controlled. MEDICATIONS were reviewed. SELF MANAGEMENT GOALS have been discussed and patient's success at attaining the goal of low cholesterol was assessed. RECOMMENDATION given include regular exercise 3 days a week and low cholesterol/low fat diet. CLINICAL SUMMARY including  written plan to identify barriers unique to the patient due to social or economic  reasons was discussed.    Long-term current use of opiate analgesic AN INDIVIDUAL CARE PLAN was established and reinforced today.  The patient's status was assessed using clinical findings on exam, labs, and other diagnostic testing. Patient's success at meeting treatment goals based on disease specific evidence-bassed guidelines and found to be in fair control. RECOMMENDATIONS include maintining present medicines and treatment. He is on chronicoxycodone with no abuse.  Negative REMS.    Other Visit Diagnoses     Screening for colon cancer       Relevant Orders   Ambulatory referral to Gastroenterology       Outpatient Encounter Medications as of 10/22/2021  Medication Sig   atorvastatin (LIPITOR) 20 MG tablet TAKE 1 TABLET BY MOUTH EVERY DAY   diazepam (VALIUM) 5 MG tablet Take 10 mg by mouth at bedtime. scheduled   esomeprazole (NEXIUM) 40 MG capsule TAKE 1 CAPSULE BY MOUTH TWICE A DAY   fluticasone (FLONASE) 50 MCG/ACT nasal spray SPRAY 1 SPRAY INTO EACH NOSTRIL EVERY DAY AS NEEDED FOR ALLERGIES   hydrochlorothiazide (HYDRODIURIL) 25 MG tablet Take 25 mg by mouth every evening.    LINZESS 72 MCG capsule TAKE 1 CAPSULE (72 MCG TOTAL) BY MOUTH DAILY BEFORE BREAKFAST.   meloxicam (MOBIC) 15 MG tablet Take 15 mg by mouth daily.   ODEFSEY 200-25-25 MG TABS tablet Take 1 tablet by mouth every morning.   oxyCODONE (ROXICODONE) 15 MG immediate release tablet Take 1 tablet (15 mg total) by mouth every 6 (six) hours as needed for pain.   PROAIR HFA 108 (90 BASE) MCG/ACT inhaler Inhale 2 puffs into the lungs every 6 (six) hours as needed for wheezing or shortness of breath.    valACYclovir (VALTREX) 500 MG tablet Take 1 tablet by mouth daily as needed (outbreaks).    No facility-administered encounter medications on file as of 10/22/2021.    Follow-up: Return in about 3 months (around 01/21/2022).   Reinaldo Meeker, MD

## 2021-10-23 ENCOUNTER — Ambulatory Visit: Payer: Managed Care, Other (non HMO) | Admitting: Legal Medicine

## 2021-10-23 LAB — LIPID PANEL
Chol/HDL Ratio: 3.4 ratio (ref 0.0–5.0)
Cholesterol, Total: 169 mg/dL (ref 100–199)
HDL: 49 mg/dL (ref >39)
LDL Chol Calc (NIH): 101 mg/dL — ABNORMAL HIGH (ref 0–99)
Triglycerides: 107 mg/dL (ref 0–149)
VLDL Cholesterol Cal: 19 mg/dL (ref 5–40)

## 2021-10-23 LAB — CBC WITH DIFFERENTIAL/PLATELET
Basophils Absolute: 0.1 10*3/uL (ref 0.0–0.2)
Basos: 1 %
EOS (ABSOLUTE): 0.2 10*3/uL (ref 0.0–0.4)
Eos: 2 %
Hematocrit: 42.2 % (ref 37.5–51.0)
Hemoglobin: 14.7 g/dL (ref 13.0–17.7)
Immature Grans (Abs): 0 10*3/uL (ref 0.0–0.1)
Immature Granulocytes: 0 %
Lymphocytes Absolute: 2.1 10*3/uL (ref 0.7–3.1)
Lymphs: 22 %
MCH: 32.6 pg (ref 26.6–33.0)
MCHC: 34.8 g/dL (ref 31.5–35.7)
MCV: 94 fL (ref 79–97)
Monocytes Absolute: 1.1 10*3/uL — ABNORMAL HIGH (ref 0.1–0.9)
Monocytes: 11 %
Neutrophils Absolute: 6.1 10*3/uL (ref 1.4–7.0)
Neutrophils: 64 %
Platelets: 196 10*3/uL (ref 150–450)
RBC: 4.51 x10E6/uL (ref 4.14–5.80)
RDW: 12.2 % (ref 11.6–15.4)
WBC: 9.5 10*3/uL (ref 3.4–10.8)

## 2021-10-23 LAB — COMPREHENSIVE METABOLIC PANEL
ALT: 20 IU/L (ref 0–44)
AST: 25 IU/L (ref 0–40)
Albumin/Globulin Ratio: 1.8 (ref 1.2–2.2)
Albumin: 4 g/dL (ref 3.8–4.9)
Alkaline Phosphatase: 77 IU/L (ref 44–121)
BUN/Creatinine Ratio: 14 (ref 9–20)
BUN: 18 mg/dL (ref 6–24)
Bilirubin Total: 0.3 mg/dL (ref 0.0–1.2)
CO2: 25 mmol/L (ref 20–29)
Calcium: 9.2 mg/dL (ref 8.7–10.2)
Chloride: 102 mmol/L (ref 96–106)
Creatinine, Ser: 1.28 mg/dL — ABNORMAL HIGH (ref 0.76–1.27)
Globulin, Total: 2.2 g/dL (ref 1.5–4.5)
Glucose: 88 mg/dL (ref 70–99)
Potassium: 4.6 mmol/L (ref 3.5–5.2)
Sodium: 140 mmol/L (ref 134–144)
Total Protein: 6.2 g/dL (ref 6.0–8.5)
eGFR: 65 mL/min/{1.73_m2} (ref >59)

## 2021-10-23 LAB — CARDIOVASCULAR RISK ASSESSMENT

## 2021-10-23 NOTE — Progress Notes (Signed)
Cbc normal, kidney tests ok, liver tests ok, Cholesterol LDL cholestanetriol 101 lp

## 2021-11-14 ENCOUNTER — Other Ambulatory Visit: Payer: Self-pay

## 2021-11-14 DIAGNOSIS — M4716 Other spondylosis with myelopathy, lumbar region: Secondary | ICD-10-CM

## 2021-11-14 MED ORDER — OXYCODONE HCL 15 MG PO TABS: 15.0000 mg | ORAL_TABLET | Freq: Four times a day (QID) | ORAL | 0 refills | Status: DC | PRN

## 2021-12-13 ENCOUNTER — Other Ambulatory Visit: Payer: Self-pay

## 2021-12-13 DIAGNOSIS — M4716 Other spondylosis with myelopathy, lumbar region: Secondary | ICD-10-CM

## 2021-12-13 MED ORDER — OXYCODONE HCL 15 MG PO TABS: 15.0000 mg | ORAL_TABLET | Freq: Four times a day (QID) | ORAL | 0 refills | Status: DC | PRN

## 2022-01-02 DIAGNOSIS — H5203 Hypermetropia, bilateral: Secondary | ICD-10-CM | POA: Diagnosis not present

## 2022-01-02 DIAGNOSIS — H524 Presbyopia: Secondary | ICD-10-CM | POA: Diagnosis not present

## 2022-01-02 DIAGNOSIS — H52223 Regular astigmatism, bilateral: Secondary | ICD-10-CM | POA: Diagnosis not present

## 2022-01-10 ENCOUNTER — Other Ambulatory Visit: Payer: Self-pay

## 2022-01-10 DIAGNOSIS — M4716 Other spondylosis with myelopathy, lumbar region: Secondary | ICD-10-CM

## 2022-01-10 MED ORDER — OXYCODONE HCL 15 MG PO TABS: 15.0000 mg | ORAL_TABLET | Freq: Four times a day (QID) | ORAL | 0 refills | Status: DC | PRN

## 2022-01-16 ENCOUNTER — Other Ambulatory Visit: Payer: Self-pay

## 2022-01-16 MED ORDER — ALBUTEROL SULFATE HFA 108 (90 BASE) MCG/ACT IN AERS: 2.0000 | INHALATION_SPRAY | Freq: Four times a day (QID) | RESPIRATORY_TRACT | 3 refills | Status: DC | PRN

## 2022-01-16 MED ORDER — FLUTICASONE PROPIONATE 50 MCG/ACT NA SUSP: NASAL | 3 refills | Status: DC

## 2022-01-23 ENCOUNTER — Ambulatory Visit: Payer: Managed Care, Other (non HMO) | Admitting: Legal Medicine

## 2022-01-30 ENCOUNTER — Ambulatory Visit: Payer: Managed Care, Other (non HMO) | Admitting: Legal Medicine

## 2022-02-05 DIAGNOSIS — Z0100 Encounter for examination of eyes and vision without abnormal findings: Secondary | ICD-10-CM | POA: Diagnosis not present

## 2022-02-06 ENCOUNTER — Encounter: Payer: Self-pay | Admitting: Legal Medicine

## 2022-02-06 ENCOUNTER — Other Ambulatory Visit: Payer: Self-pay

## 2022-02-06 ENCOUNTER — Ambulatory Visit (INDEPENDENT_AMBULATORY_CARE_PROVIDER_SITE_OTHER): Payer: Medicare Other | Admitting: Legal Medicine

## 2022-02-06 VITALS — BP 140/80 | HR 68 | Temp 97.5°F | Resp 15 | Ht 66.5 in | Wt 189.0 lb

## 2022-02-06 DIAGNOSIS — J302 Other seasonal allergic rhinitis: Secondary | ICD-10-CM | POA: Diagnosis not present

## 2022-02-06 DIAGNOSIS — R69 Illness, unspecified: Secondary | ICD-10-CM | POA: Diagnosis not present

## 2022-02-06 DIAGNOSIS — Z21 Asymptomatic human immunodeficiency virus [HIV] infection status: Secondary | ICD-10-CM

## 2022-02-06 DIAGNOSIS — I10 Essential (primary) hypertension: Secondary | ICD-10-CM

## 2022-02-06 DIAGNOSIS — E782 Mixed hyperlipidemia: Secondary | ICD-10-CM

## 2022-02-06 DIAGNOSIS — G90521 Complex regional pain syndrome I of right lower limb: Secondary | ICD-10-CM

## 2022-02-06 DIAGNOSIS — K2100 Gastro-esophageal reflux disease with esophagitis, without bleeding: Secondary | ICD-10-CM | POA: Diagnosis not present

## 2022-02-06 DIAGNOSIS — M4716 Other spondylosis with myelopathy, lumbar region: Secondary | ICD-10-CM | POA: Diagnosis not present

## 2022-02-06 DIAGNOSIS — Z79891 Long term (current) use of opiate analgesic: Secondary | ICD-10-CM

## 2022-02-06 DIAGNOSIS — G90529 Complex regional pain syndrome I of unspecified lower limb: Secondary | ICD-10-CM

## 2022-02-06 HISTORY — DX: Complex regional pain syndrome i of unspecified lower limb: G90.529

## 2022-02-06 MED ORDER — OXYCODONE HCL 15 MG PO TABS: 15.0000 mg | ORAL_TABLET | Freq: Four times a day (QID) | ORAL | 0 refills | Status: DC | PRN

## 2022-02-06 MED ORDER — TRIAMCINOLONE ACETONIDE 40 MG/ML IJ SUSP
80.0000 mg | Freq: Once | INTRAMUSCULAR | Status: AC
  Administered 2022-02-06: 80 mg via INTRAMUSCULAR

## 2022-02-06 NOTE — Progress Notes (Signed)
Subjective:  Patient ID: Kevin Pena, male    DOB: 19-Jul-1966  Age: 56 y.o. MRN: [id number]  Chief Complaint  Patient presents with   Hyperlipidemia   Hypertension   Gastroesophageal Reflux    Hyperlipidemia Pertinent negatives include no chest pain or shortness of breath.  Hypertension Pertinent negatives include no chest pain, headaches, neck pain, palpitations or shortness of breath.  Gastroesophageal Reflux He reports no abdominal pain, no chest pain, no coughing, no nausea, no sore throat or no wheezing. Pertinent negatives include no fatigue.    Patient presents for follow up of hypertension.  Patient tolerating hydrochlorothiazide 25 mg well without side effects.  Patient was diagnosed with hypertension has been treated for hypertension for multiple years. Patient is working on maintaining diet and exercise regimen and follows up as directed.  Patient presents with hyperlipidemia.  Compliance with treatment has been good; patient takes medicines as directed, maintains low cholesterol diet, follows up as directed, and maintains exercise regimen.  Patient is using Atorvastatin 20 mg without problems.   Patient has gastroesophageal reflux. The symptoms are mild intensity.  Length of symptoms 10+ years.  Medicines include esomeprazole 40 mg BID.   Patient has chronic pain on medicines.  Pain is 2-3 on medicines. He has regional pain syndrome right leg.  HIV positive.  Pain off medicines 8-9?10  Current Outpatient Medications on File Prior to Visit  Medication Sig Dispense Refill   albuterol (PROAIR HFA) 108 (90 Base) MCG/ACT inhaler Inhale 2 puffs into the lungs every 6 (six) hours as needed for wheezing or shortness of breath. 18 g 3   atorvastatin (LIPITOR) 20 MG tablet TAKE 1 TABLET BY MOUTH EVERY DAY 90 tablet 2   diazepam (VALIUM) 5 MG tablet Take 10 mg by mouth at bedtime. scheduled     esomeprazole (NEXIUM) 40 MG capsule TAKE 1 CAPSULE BY MOUTH TWICE A DAY 180  capsule 2   fluticasone (FLONASE) 50 MCG/ACT nasal spray SPRAY 1 SPRAY INTO EACH NOSTRIL EVERY DAY AS NEEDED FOR ALLERGIES 16 g 3   hydrochlorothiazide (HYDRODIURIL) 25 MG tablet Take 25 mg by mouth every evening.      LINZESS 72 MCG capsule TAKE 1 CAPSULE (72 MCG TOTAL) BY MOUTH DAILY BEFORE BREAKFAST. 30 capsule 6   meloxicam (MOBIC) 15 MG tablet Take 15 mg by mouth daily.     ODEFSEY 200-25-25 MG TABS tablet Take 1 tablet by mouth every morning.     valACYclovir (VALTREX) 500 MG tablet Take 1 tablet by mouth daily as needed (outbreaks).      No current facility-administered medications on file prior to visit.   Past Medical History:  Diagnosis Date   Arthritis    GERD (gastroesophageal reflux disease)    Internal hemorrhoids    Long-term current use of opiate analgesic 03/14/2020   Presence of tooth-root and mandibular implants 05/29/2020   Rash left leg-- using topical cream   Spondylosis with myelopathy, 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   Past Surgical History:  Procedure Laterality Date   APPENDECTOMY  1981   CHOLECYSTECTOMY  2006   dental implants  11/2020   FOOT SURGERY     HAMMER TOE SURGERY  OCT 2010   INTERSTIM IMPLANT PLACEMENT  06/17/2012   Procedure: Leane Platt IMPLANT FIRST STAGE;  Surgeon: Martina Sinner, MD;  Location: [facility];  Service: Urology;  Laterality: N/A;  RAD TECH OK PER VICKIE AT MAIN OR    INTERSTIM IMPLANT PLACEMENT  06/17/2012   Procedure: Leane Platt  IMPLANT SECOND STAGE;  Surgeon: Martina Sinner, MD;  Location: [facility];  Service: Urology;  Laterality: N/A;   MOUTH SURGERY  02/16/2020   REPAIR RECURRENT RIGHT INGUINAL HERNIA  2000   RIGHT INGUINAL HERNIA REPAIR  1997   UMBILICAL HERNIA REPAIR  2010    Family History  Problem Relation Age of Onset   Other Mother        colon resection, tumor near liver   Diabetes Mother    Hyperlipidemia Mother    Hypertension Mother    Varicose Veins Mother    Heart Problems  Father    Diabetes Father    Heart disease Father    Hyperlipidemia Father    Hypertension Father    Pancreatic cancer Neg Hx    Rectal cancer Neg Hx    Stomach cancer Neg Hx    Colon cancer Neg Hx    Social History   Socioeconomic History   Marital status: Significant Other    Spouse name: [name]   Number of children: 1   Years of education: College   Highest education level: Not on file  Occupational History    Employer: OTHER    Comment: disability  Tobacco Use   Smoking status: Former    Packs/day: 1.00    Years: 30.00    Pack years: 30.00    Types: Cigarettes    Quit date: 06/11/2010    Years since quitting: 11.6   Smokeless tobacco: Never  Vaping Use   Vaping Use: Some days  Substance and Sexual Activity   Alcohol use: Yes    Alcohol/week: 2.0 standard drinks    Types: 2 Cans of beer per week    Comment: rarely   Drug use: No   Sexual activity: Not Currently    Partners: Male  Other Topics Concern   Not on file  Social History Narrative   Patient lives at home with partner.   Caffeine Use: 32oz daily coffee/tea   Social Determinants of Corporate investment banker Strain: Not on file  Food Insecurity: Not on file  Transportation Needs: Not on file  Physical Activity: Not on file  Stress: Not on file  Social Connections: Not on file    Review of Systems  Constitutional:  Negative for chills, fatigue, fever and unexpected weight change.  HENT:  Negative for congestion, rhinorrhea, sinus pressure, sneezing and sore throat.   Eyes:  Negative for discharge and visual disturbance.  Respiratory:  Negative for cough, shortness of breath and wheezing.   Cardiovascular:  Negative for chest pain and palpitations.  Gastrointestinal:  Negative for abdominal pain, diarrhea, nausea and vomiting.  Endocrine: Negative for polydipsia, polyphagia and polyuria.  Genitourinary:  Negative for decreased urine volume, difficulty urinating, dysuria, frequency,  penile swelling and urgency.  Musculoskeletal:  Negative for back pain, gait problem, joint swelling, neck pain and neck stiffness.  Neurological:  Negative for dizziness, seizures, weakness, numbness and headaches.  Psychiatric/Behavioral:  Negative for confusion, hallucinations, sleep disturbance and suicidal ideas. The patient is not nervous/anxious and is not hyperactive.     Objective:  BP 140/80   Pulse 68   Temp (!) 97.5 F (36.4 C)   Resp 15   Ht 5' 6.5" (1.689 m)   Wt 189 lb (85.7 kg)   SpO2 98%   BMI 30.05 kg/m   BP/Weight 02/06/2022 10/22/2021 07/04/2021  Systolic BP 140 128 130  Diastolic BP 80 64 60  Wt. (Lbs) 189 185  181  BMI 30.05 26.54 25.97    Physical Exam Vitals reviewed.  Constitutional:      General: He is not in acute distress.    Appearance: Normal appearance.  HENT:     Head: Normocephalic.     Right Ear: Tympanic membrane normal.     Left Ear: Tympanic membrane normal.     Nose: Congestion present.     Mouth/Throat:     Mouth: Mucous membranes are moist.     Pharynx: Oropharynx is clear.  Eyes:     Extraocular Movements: Extraocular movements intact.     Conjunctiva/sclera: Conjunctivae normal.     Pupils: Pupils are equal, round, and reactive to light.  Cardiovascular:     Rate and Rhythm: Normal rate and regular rhythm.     Pulses: Normal pulses.     Heart sounds: Normal heart sounds. No murmur heard.   No gallop.  Pulmonary:     Effort: Pulmonary effort is normal. No respiratory distress.     Breath sounds: Normal breath sounds. No wheezing.  Abdominal:     General: Abdomen is flat. Bowel sounds are normal. There is no distension.     Tenderness: There is no abdominal tenderness.  Musculoskeletal:     Cervical back: Normal range of motion.     Right lower leg: Edema present.     Comments: Pain right leg  Skin:    General: Skin is warm.     Capillary Refill: Capillary refill takes less than 2 seconds.  Neurological:     General: No  focal deficit present.     Mental Status: He is alert and oriented to person, place, and time. Mental status is at baseline.  Psychiatric:        Mood and Affect: Mood normal.        Thought Content: Thought content normal.        Judgment: Judgment normal.        Lab Results  Component Value Date   WBC 9.5 10/22/2021   HGB 14.7 10/22/2021   HCT 42.2 10/22/2021   PLT 196 10/22/2021   GLUCOSE 88 10/22/2021   CHOL 169 10/22/2021   TRIG 107 10/22/2021   HDL 49 10/22/2021   LDLCALC 101 (H) 10/22/2021   ALT 20 10/22/2021   AST 25 10/22/2021   NA 140 10/22/2021   K 4.6 10/22/2021   CL 102 10/22/2021   CREATININE 1.28 (H) 10/22/2021   BUN 18 10/22/2021   CO2 25 10/22/2021      Assessment & Plan:   Problem List Items Addressed This Visit       Cardiovascular and Mediastinum   BP (high blood pressure)   Relevant Orders   CBC with Differential   Comprehensive metabolic panel An individual hypertension care plan was established and reinforced today.  The patient's status was assessed using clinical findings on exam and labs or diagnostic tests. The patient's success at meeting treatment goals on disease specific evidence-based guidelines and found to be well controlled. SELF MANAGEMENT: The patient and I together assessed ways to personally work towards obtaining the recommended goals. RECOMMENDATIONS: avoid decongestants found in common cold remedies, decrease consumption of alcohol, perform routine monitoring of BP with home BP cuff, exercise, reduction of dietary salt, take medicines as prescribed, try not to miss doses and quit smoking.  Regular exercise and maintaining a healthy weight is needed.  Stress reduction may help. A CLINICAL SUMMARY including written plan identify barriers to care unique to  individual due to social or financial issues.  We attempt to mutually creat solutions for individual and family understanding.      Digestive   GERD (gastroesophageal reflux  disease)   Relevant Orders   CBC with Differential   Comprehensive metabolic panel Plan of care was formulated today.  he is doing well.  A plan of care was formulated using patient exam, tests and other sources to optimize care using evidence based information.  Recommend no smoking, no eating after supper, avoid fatty foods, elevate Head of bed, avoid tight fitting clothing.  Continue on nexium.      Nervous and Auditory   Spondylosis with myelopathy, lumbar region   Relevant Medications   oxyCODONE (ROXICODONE) 15 MG immediate release tablet We discussed getting pain clinic consult for chronic narcotic treatment    Complex regional pain syndrome I of lower limb   Relevant Medications   oxyCODONE (ROXICODONE) 15 MG immediate release tablet Patient has chronic pain AN INDIVIDUAL CARE PLAN was established and reinforced today.  The patient's status was assessed using clinical findings on exam, labs, and other diagnostic testing. Patient's success at meeting treatment goals based on disease specific evidence-bassed guidelines and found to be in fair control. RECOMMENDATIONS include maintining present medicines and treatment. He is on chronicoxycodone with no abuse.  Negative REMS.      Other   HIV (human immunodeficiency virus infection) (HCC) Chronic HIV and see [facility], negative for WIV particles    Mixed hyperlipidemia - Primary   Relevant Orders   Lipid panel AN INDIVIDUAL CARE PLAN for hyperlipidemia/ cholesterol was established and reinforced today.  The patient's status was assessed using clinical findings on exam, lab and other diagnostic tests. The patient's disease status was assessed based on evidence-based guidelines and found to be fair controlled. MEDICATIONS were reviewed. SELF MANAGEMENT GOALS have been discussed and patient's success at attaining the goal of low cholesterol was assessed. RECOMMENDATION given include regular exercise 3 days a week and low cholesterol/low fat  diet. CLINICAL SUMMARY including written plan to identify barriers unique to the patient due to social or economic  reasons was discussed.     Long-term current use of opiate analgesic Patient has chronic pain no abuse   Other Visit Diagnoses     Seasonal allergies       Relevant Medications   triamcinolone acetonide (KENALOG-40) injection 80 mg (Completed) Patient has chronic allergies, kenalog 80mg  IM     .  Meds ordered this encounter  Medications   oxyCODONE (ROXICODONE) 15 MG immediate release tablet    Sig: Take 1 tablet (15 mg total) by mouth every 6 (six) hours as needed for pain.    Dispense:  120 tablet    Refill:  0   triamcinolone acetonide (KENALOG-40) injection 80 mg    Orders Placed This Encounter  Procedures   CBC with Differential   Comprehensive metabolic panel   Lipid panel   35 minute visit to discuss pain meds and his chronic pain, old records reviewed  Follow-up: Return in about 3 months (around 05/09/2022) for dr. cox.  An After Visit Summary was printed and given to the patient.  Brent Bulla, MD [facility] [phone]

## 2022-02-07 ENCOUNTER — Other Ambulatory Visit: Payer: Self-pay | Admitting: Legal Medicine

## 2022-02-07 LAB — CBC WITH DIFFERENTIAL/PLATELET
Basophils Absolute: 0.1 10*3/uL (ref 0.0–0.2)
Basos: 1 %
EOS (ABSOLUTE): 0.4 10*3/uL (ref 0.0–0.4)
Eos: 4 %
Hematocrit: 44.2 % (ref 37.5–51.0)
Hemoglobin: 15.1 g/dL (ref 13.0–17.7)
Immature Grans (Abs): 0.1 10*3/uL (ref 0.0–0.1)
Immature Granulocytes: 1 %
Lymphocytes Absolute: 3.1 10*3/uL (ref 0.7–3.1)
Lymphs: 34 %
MCH: 31.9 pg (ref 26.6–33.0)
MCHC: 34.2 g/dL (ref 31.5–35.7)
MCV: 93 fL (ref 79–97)
Monocytes Absolute: 0.9 10*3/uL (ref 0.1–0.9)
Monocytes: 10 %
Neutrophils Absolute: 4.8 10*3/uL (ref 1.4–7.0)
Neutrophils: 50 %
Platelets: 227 10*3/uL (ref 150–450)
RBC: 4.73 x10E6/uL (ref 4.14–5.80)
RDW: 11.5 % — ABNORMAL LOW (ref 11.6–15.4)
WBC: 9.2 10*3/uL (ref 3.4–10.8)

## 2022-02-07 LAB — COMPREHENSIVE METABOLIC PANEL
ALT: 10 IU/L (ref 0–44)
AST: 18 IU/L (ref 0–40)
Albumin/Globulin Ratio: 1.6 (ref 1.2–2.2)
Albumin: 4 g/dL (ref 3.8–4.9)
Alkaline Phosphatase: 78 IU/L (ref 44–121)
BUN/Creatinine Ratio: 13 (ref 9–20)
BUN: 16 mg/dL (ref 6–24)
Bilirubin Total: 0.2 mg/dL (ref 0.0–1.2)
CO2: 26 mmol/L (ref 20–29)
Calcium: 8.9 mg/dL (ref 8.7–10.2)
Chloride: 101 mmol/L (ref 96–106)
Creatinine, Ser: 1.21 mg/dL (ref 0.76–1.27)
Globulin, Total: 2.5 g/dL (ref 1.5–4.5)
Glucose: 88 mg/dL (ref 70–99)
Potassium: 4.8 mmol/L (ref 3.5–5.2)
Sodium: 139 mmol/L (ref 134–144)
Total Protein: 6.5 g/dL (ref 6.0–8.5)
eGFR: 70 mL/min/{1.73_m2} (ref >59)

## 2022-02-07 LAB — LIPID PANEL
Chol/HDL Ratio: 2.9 ratio (ref 0.0–5.0)
Cholesterol, Total: 168 mg/dL (ref 100–199)
HDL: 57 mg/dL (ref >39)
LDL Chol Calc (NIH): 96 mg/dL (ref 0–99)
Triglycerides: 82 mg/dL (ref 0–149)
VLDL Cholesterol Cal: 15 mg/dL (ref 5–40)

## 2022-02-07 LAB — CARDIOVASCULAR RISK ASSESSMENT

## 2022-02-07 NOTE — Progress Notes (Signed)
CBC normal, kidney and liver tests normal, LDL cholesterol 101,  ?lp

## 2022-03-12 ENCOUNTER — Other Ambulatory Visit: Payer: Self-pay

## 2022-03-12 ENCOUNTER — Other Ambulatory Visit: Payer: Self-pay | Admitting: Legal Medicine

## 2022-03-12 DIAGNOSIS — M4716 Other spondylosis with myelopathy, lumbar region: Secondary | ICD-10-CM

## 2022-03-12 DIAGNOSIS — G90521 Complex regional pain syndrome I of right lower limb: Secondary | ICD-10-CM

## 2022-03-12 MED ORDER — OXYCODONE HCL 15 MG PO TABS: 15.0000 mg | ORAL_TABLET | Freq: Four times a day (QID) | ORAL | 0 refills | Status: DC | PRN

## 2022-04-08 ENCOUNTER — Other Ambulatory Visit: Payer: Self-pay | Admitting: Legal Medicine

## 2022-04-10 ENCOUNTER — Other Ambulatory Visit: Payer: Self-pay

## 2022-04-10 DIAGNOSIS — G90521 Complex regional pain syndrome I of right lower limb: Secondary | ICD-10-CM

## 2022-04-10 DIAGNOSIS — M4716 Other spondylosis with myelopathy, lumbar region: Secondary | ICD-10-CM

## 2022-04-11 ENCOUNTER — Other Ambulatory Visit: Payer: Self-pay

## 2022-04-11 DIAGNOSIS — G90521 Complex regional pain syndrome I of right lower limb: Secondary | ICD-10-CM

## 2022-04-11 DIAGNOSIS — M4716 Other spondylosis with myelopathy, lumbar region: Secondary | ICD-10-CM

## 2022-04-11 MED ORDER — OXYCODONE HCL 15 MG PO TABS: 15.0000 mg | ORAL_TABLET | Freq: Four times a day (QID) | ORAL | 0 refills | Status: DC | PRN

## 2022-04-11 NOTE — Telephone Encounter (Signed)
Patient called requesting refill of Oxycodone 15 [facility] [place].  Last refill was 03/12/22 #120 -- has upcoming appt with Dr Sedalia Muta in June (was Dr Marina Goodell patient)

## 2022-05-09 ENCOUNTER — Other Ambulatory Visit: Payer: Self-pay | Admitting: Legal Medicine

## 2022-05-12 ENCOUNTER — Other Ambulatory Visit: Payer: Self-pay

## 2022-05-12 DIAGNOSIS — G90521 Complex regional pain syndrome I of right lower limb: Secondary | ICD-10-CM

## 2022-05-12 DIAGNOSIS — M4716 Other spondylosis with myelopathy, lumbar region: Secondary | ICD-10-CM

## 2022-05-13 ENCOUNTER — Ambulatory Visit (INDEPENDENT_AMBULATORY_CARE_PROVIDER_SITE_OTHER): Payer: Managed Care, Other (non HMO) | Admitting: Family Medicine

## 2022-05-13 ENCOUNTER — Encounter: Payer: Self-pay | Admitting: Family Medicine

## 2022-05-13 ENCOUNTER — Other Ambulatory Visit: Payer: Self-pay | Admitting: Family Medicine

## 2022-05-13 ENCOUNTER — Other Ambulatory Visit: Payer: Self-pay

## 2022-05-13 VITALS — BP 136/60 | HR 68 | Temp 97.4°F | Resp 18 | Ht 65.5 in | Wt 188.0 lb

## 2022-05-13 DIAGNOSIS — M25562 Pain in left knee: Secondary | ICD-10-CM

## 2022-05-13 DIAGNOSIS — M545 Low back pain, unspecified: Secondary | ICD-10-CM

## 2022-05-13 DIAGNOSIS — Z21 Asymptomatic human immunodeficiency virus [HIV] infection status: Secondary | ICD-10-CM

## 2022-05-13 DIAGNOSIS — E782 Mixed hyperlipidemia: Secondary | ICD-10-CM | POA: Diagnosis not present

## 2022-05-13 DIAGNOSIS — G8929 Other chronic pain: Secondary | ICD-10-CM | POA: Diagnosis not present

## 2022-05-13 DIAGNOSIS — G4733 Obstructive sleep apnea (adult) (pediatric): Secondary | ICD-10-CM

## 2022-05-13 DIAGNOSIS — K2100 Gastro-esophageal reflux disease with esophagitis, without bleeding: Secondary | ICD-10-CM

## 2022-05-13 DIAGNOSIS — G90521 Complex regional pain syndrome I of right lower limb: Secondary | ICD-10-CM

## 2022-05-13 DIAGNOSIS — M25561 Pain in right knee: Secondary | ICD-10-CM | POA: Diagnosis not present

## 2022-05-13 DIAGNOSIS — M25551 Pain in right hip: Secondary | ICD-10-CM

## 2022-05-13 DIAGNOSIS — R69 Illness, unspecified: Secondary | ICD-10-CM | POA: Diagnosis not present

## 2022-05-13 DIAGNOSIS — M1711 Unilateral primary osteoarthritis, right knee: Secondary | ICD-10-CM | POA: Diagnosis not present

## 2022-05-13 DIAGNOSIS — M1712 Unilateral primary osteoarthritis, left knee: Secondary | ICD-10-CM | POA: Diagnosis not present

## 2022-05-13 DIAGNOSIS — M25552 Pain in left hip: Secondary | ICD-10-CM | POA: Diagnosis not present

## 2022-05-13 DIAGNOSIS — I10 Essential (primary) hypertension: Secondary | ICD-10-CM | POA: Diagnosis not present

## 2022-05-13 DIAGNOSIS — M4716 Other spondylosis with myelopathy, lumbar region: Secondary | ICD-10-CM

## 2022-05-13 MED ORDER — OXYCODONE HCL 15 MG PO TABS
15.0000 mg | ORAL_TABLET | Freq: Four times a day (QID) | ORAL | 0 refills | Status: DC | PRN
Start: 2022-05-13 — End: 2022-05-14

## 2022-05-13 NOTE — Progress Notes (Unsigned)
Subjective:  Patient ID: Kevin Pena, male    DOB: Apr 21, 1966  Age: 56 y.o. MRN: [id number]  Chief Complaint  Patient presents with   Prediabetes   Hyperlipidemia   HPI   Current Outpatient Medications on File Prior to Visit  Medication Sig Dispense Refill   albuterol (PROAIR HFA) 108 (90 Base) MCG/ACT inhaler Inhale 2 puffs into the lungs every 6 (six) hours as needed for wheezing or shortness of breath. 18 g 3   atorvastatin (LIPITOR) 20 MG tablet TAKE 1 TABLET BY MOUTH EVERY DAY 30 tablet 0   diazepam (VALIUM) 5 MG tablet Take 10 mg by mouth at bedtime. scheduled     esomeprazole (NEXIUM) 40 MG capsule TAKE 1 CAPSULE BY MOUTH TWICE A DAY 180 capsule 2   fluticasone (FLONASE) 50 MCG/ACT nasal spray SPRAY 1 SPRAY INTO EACH NOSTRIL EVERY DAY AS NEEDED FOR ALLERGIES 48 mL 2   hydrochlorothiazide (HYDRODIURIL) 25 MG tablet Take 25 mg by mouth every evening.      LINZESS 72 MCG capsule TAKE 1 CAPSULE BY MOUTH DAILY BEFORE BREAKFAST. 30 capsule 6   meloxicam (MOBIC) 15 MG tablet Take 15 mg by mouth daily.     ODEFSEY 200-25-25 MG TABS tablet Take 1 tablet by mouth every morning.     oxyCODONE (ROXICODONE) 15 MG immediate release tablet Take 1 tablet (15 mg total) by mouth every 6 (six) hours as needed for pain. Must see pain clinic 120 tablet 0   valACYclovir (VALTREX) 500 MG tablet Take 1 tablet by mouth daily as needed (outbreaks).      No current facility-administered medications on file prior to visit.   Past Medical History:  Diagnosis Date   Arthritis    GERD (gastroesophageal reflux disease)    Internal hemorrhoids    Long-term current use of opiate analgesic 03/14/2020   Presence of tooth-root and mandibular implants 05/29/2020   Rash left leg-- using topical cream   Spondylosis with myelopathy, 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   Past Surgical History:  Procedure Laterality Date   APPENDECTOMY  1981   CHOLECYSTECTOMY  2006   dental implants  11/2020   FOOT SURGERY      HAMMER TOE SURGERY  OCT 2010   INTERSTIM IMPLANT PLACEMENT  06/17/2012   Procedure: Leane Platt IMPLANT FIRST STAGE;  Surgeon: Martina Sinner, MD;  Location: [facility];  Service: Urology;  Laterality: N/A;  RAD TECH OK PER VICKIE AT MAIN OR    INTERSTIM IMPLANT PLACEMENT  06/17/2012   Procedure: INTERSTIM IMPLANT SECOND STAGE;  Surgeon: Martina Sinner, MD;  Location: [facility];  Service: Urology;  Laterality: N/A;   MOUTH SURGERY  02/16/2020   REPAIR RECURRENT RIGHT INGUINAL HERNIA  2000   RIGHT INGUINAL HERNIA REPAIR  1997   UMBILICAL HERNIA REPAIR  2010    Family History  Problem Relation Age of Onset   Other Mother        colon resection, tumor near liver   Diabetes Mother    Hyperlipidemia Mother    Hypertension Mother    Varicose Veins Mother    Heart Problems Father    Diabetes Father    Heart disease Father    Hyperlipidemia Father    Hypertension Father    Pancreatic cancer Neg Hx    Rectal cancer Neg Hx    Stomach cancer Neg Hx    Colon cancer Neg Hx    Social History   Socioeconomic History   Marital status:  Significant Other    Spouse name: [name]   Number of children: 1   Years of education: College   Highest education level: Not on file  Occupational History    Employer: OTHER    Comment: disability  Tobacco Use   Smoking status: Former    Packs/day: 1.00    Years: 30.00    Total pack years: 30.00    Types: Cigarettes    Quit date: 06/11/2010    Years since quitting: 11.9   Smokeless tobacco: Never  Vaping Use   Vaping Use: Some days  Substance and Sexual Activity   Alcohol use: Yes    Alcohol/week: 2.0 standard drinks of alcohol    Types: 2 Cans of beer per week    Comment: rarely   Drug use: No   Sexual activity: Not Currently    Partners: Male  Other Topics Concern   Not on file  Social History Narrative   Patient lives at home with partner.   Caffeine Use: 32oz daily coffee/tea   Social  Determinants of Corporate investment banker Strain: Not on file  Food Insecurity: Not on file  Transportation Needs: Not on file  Physical Activity: Not on file  Stress: Not on file  Social Connections: Not on file    Review of Systems  Constitutional:  Positive for fatigue. Negative for chills and fever.  HENT:  Negative for congestion, rhinorrhea and sore throat.   Respiratory:  Negative for cough and shortness of breath.   Cardiovascular:  Negative for chest pain and palpitations.  Gastrointestinal:  Positive for constipation (controlled with linzess.). Negative for abdominal pain, diarrhea, nausea and vomiting.  Genitourinary:  Negative for dysuria and urgency.  Musculoskeletal:  Positive for arthralgias (bilateral hip and knee pain), back pain (chronic low back pain) and myalgias (cramps are worse at night).  Neurological:  Positive for numbness (left fingers, bilateral foot tingling) and headaches. Negative for dizziness.  Psychiatric/Behavioral:  Negative for dysphoric mood. The patient is not nervous/anxious.      Objective:  BP 136/60   Pulse 68   Temp (!) 97.4 F (36.3 C)   Resp 18   Ht 5' 5.5" (1.664 m)   Wt 188 lb (85.3 kg)   BMI 30.81 kg/m      05/13/2022   10:29 AM 02/06/2022   11:27 AM 10/22/2021   11:36 AM  BP/Weight  Systolic BP 136 140 128  Diastolic BP 60 80 64  Wt. (Lbs) 188 189 185  BMI 30.81 kg/m2 30.05 kg/m2 26.54 kg/m2    Physical Exam  Diabetic Foot Exam - Simple   No data filed      Lab Results  Component Value Date   WBC 9.2 02/06/2022   HGB 15.1 02/06/2022   HCT 44.2 02/06/2022   PLT 227 02/06/2022   GLUCOSE 88 02/06/2022   CHOL 168 02/06/2022   TRIG 82 02/06/2022   HDL 57 02/06/2022   LDLCALC 96 02/06/2022   ALT 10 02/06/2022   AST 18 02/06/2022   NA 139 02/06/2022   K 4.8 02/06/2022   CL 101 02/06/2022   CREATININE 1.21 02/06/2022   BUN 16 02/06/2022   CO2 26 02/06/2022      Assessment & Plan:   Problem List  Items Addressed This Visit   None .  No orders of the defined types were placed in this encounter.   No orders of the defined types were placed in this encounter.    Follow-up:  No follow-ups on file.  An After Visit Summary was printed and given to the patient.  Rochel Brome, MD [facility] [phone]

## 2022-05-14 ENCOUNTER — Other Ambulatory Visit: Payer: Self-pay

## 2022-05-14 ENCOUNTER — Other Ambulatory Visit: Payer: Self-pay | Admitting: Family Medicine

## 2022-05-14 DIAGNOSIS — M4716 Other spondylosis with myelopathy, lumbar region: Secondary | ICD-10-CM

## 2022-05-14 DIAGNOSIS — G90521 Complex regional pain syndrome I of right lower limb: Secondary | ICD-10-CM

## 2022-05-14 MED ORDER — OXYCODONE HCL 20 MG PO TABS: 20.0000 mg | ORAL_TABLET | Freq: Four times a day (QID) | ORAL | 0 refills | Status: DC | PRN

## 2022-05-18 ENCOUNTER — Encounter: Payer: Self-pay | Admitting: Family Medicine

## 2022-05-18 DIAGNOSIS — G8929 Other chronic pain: Secondary | ICD-10-CM

## 2022-05-18 DIAGNOSIS — M25552 Pain in left hip: Secondary | ICD-10-CM | POA: Insufficient documentation

## 2022-05-18 DIAGNOSIS — M545 Low back pain, unspecified: Secondary | ICD-10-CM | POA: Insufficient documentation

## 2022-05-18 DIAGNOSIS — M25551 Pain in right hip: Secondary | ICD-10-CM | POA: Insufficient documentation

## 2022-05-18 HISTORY — DX: Low back pain, unspecified: M54.50

## 2022-05-18 HISTORY — DX: Other chronic pain: G89.29

## 2022-05-18 HISTORY — DX: Pain in left hip: M25.552

## 2022-05-18 NOTE — Assessment & Plan Note (Signed)
The current medical regimen is effective;  continue present plan and medications. Continue on Nexium 40 mg twice daily.

## 2022-05-18 NOTE — Assessment & Plan Note (Signed)
The current medical regimen is effective;  continue present plan and medications.  

## 2022-05-18 NOTE — Assessment & Plan Note (Signed)
Management per specialist.  On odefsey.

## 2022-05-18 NOTE — Assessment & Plan Note (Signed)
Resolved with weight loss.   

## 2022-06-09 ENCOUNTER — Other Ambulatory Visit: Payer: Self-pay

## 2022-06-09 MED ORDER — OXYCODONE HCL 20 MG PO TABS: 20.0000 mg | ORAL_TABLET | Freq: Four times a day (QID) | ORAL | 0 refills | Status: DC | PRN

## 2022-06-11 ENCOUNTER — Other Ambulatory Visit: Payer: Self-pay

## 2022-06-11 DIAGNOSIS — M545 Low back pain, unspecified: Secondary | ICD-10-CM

## 2022-06-11 DIAGNOSIS — M25551 Pain in right hip: Secondary | ICD-10-CM

## 2022-06-11 DIAGNOSIS — M25552 Pain in left hip: Secondary | ICD-10-CM

## 2022-06-11 DIAGNOSIS — G8929 Other chronic pain: Secondary | ICD-10-CM

## 2022-06-17 ENCOUNTER — Other Ambulatory Visit: Payer: Self-pay

## 2022-06-17 NOTE — Telephone Encounter (Signed)
Kevin Pena was notified of xray results.  No change in treatment plan at this time.

## 2022-07-29 ENCOUNTER — Other Ambulatory Visit: Payer: Self-pay | Admitting: Legal Medicine

## 2022-08-07 ENCOUNTER — Other Ambulatory Visit: Payer: Self-pay

## 2022-08-07 MED ORDER — OXYCODONE HCL 20 MG PO TABS: 20.0000 mg | ORAL_TABLET | Freq: Four times a day (QID) | ORAL | 0 refills | Status: DC | PRN

## 2022-08-07 NOTE — Telephone Encounter (Signed)
Patient called requesting ref[facility] [place].  Last refilled 06/09/22 #120

## 2022-08-22 ENCOUNTER — Other Ambulatory Visit: Payer: Self-pay | Admitting: Legal Medicine

## 2022-08-24 ENCOUNTER — Other Ambulatory Visit: Payer: Self-pay | Admitting: Physician Assistant

## 2022-08-27 ENCOUNTER — Ambulatory Visit: Payer: Managed Care, Other (non HMO) | Admitting: Family Medicine

## 2022-09-08 ENCOUNTER — Other Ambulatory Visit: Payer: Self-pay

## 2022-09-08 ENCOUNTER — Ambulatory Visit (INDEPENDENT_AMBULATORY_CARE_PROVIDER_SITE_OTHER): Payer: Medicare Other | Admitting: Family Medicine

## 2022-09-08 ENCOUNTER — Encounter: Payer: Self-pay | Admitting: Family Medicine

## 2022-09-08 VITALS — BP 110/70 | HR 76 | Temp 96.9°F | Resp 18 | Ht 70.0 in | Wt 194.0 lb

## 2022-09-08 DIAGNOSIS — K5909 Other constipation: Secondary | ICD-10-CM

## 2022-09-08 DIAGNOSIS — J301 Allergic rhinitis due to pollen: Secondary | ICD-10-CM

## 2022-09-08 DIAGNOSIS — R69 Illness, unspecified: Secondary | ICD-10-CM | POA: Diagnosis not present

## 2022-09-08 DIAGNOSIS — F1190 Opioid use, unspecified, uncomplicated: Secondary | ICD-10-CM

## 2022-09-08 DIAGNOSIS — I10 Essential (primary) hypertension: Secondary | ICD-10-CM

## 2022-09-08 DIAGNOSIS — Z21 Asymptomatic human immunodeficiency virus [HIV] infection status: Secondary | ICD-10-CM

## 2022-09-08 DIAGNOSIS — K2100 Gastro-esophageal reflux disease with esophagitis, without bleeding: Secondary | ICD-10-CM | POA: Diagnosis not present

## 2022-09-08 DIAGNOSIS — G90521 Complex regional pain syndrome I of right lower limb: Secondary | ICD-10-CM

## 2022-09-08 DIAGNOSIS — E782 Mixed hyperlipidemia: Secondary | ICD-10-CM

## 2022-09-08 MED ORDER — TRIAMCINOLONE ACETONIDE 40 MG/ML IJ SUSP
80.0000 mg | Freq: Once | INTRAMUSCULAR | Status: AC
  Administered 2022-09-08: 80 mg via INTRAMUSCULAR

## 2022-09-08 MED ORDER — OXYCODONE HCL 20 MG PO TABS: 20.0000 mg | ORAL_TABLET | Freq: Four times a day (QID) | ORAL | 0 refills | Status: DC | PRN

## 2022-09-08 MED ORDER — PROMETHAZINE HCL 25 MG PO TABS: 25.0000 mg | ORAL_TABLET | Freq: Three times a day (TID) | ORAL | 3 refills | Status: DC | PRN

## 2022-09-08 NOTE — Progress Notes (Signed)
Subjective:  Patient ID: Kevin Pena, male    DOB: Jun 02, 1966  Age: 56 y.o. MRN: [id number]  Chief Complaint  Patient presents with   Hyperlipidemia    HPI Patient is a 56 year old white male with GERD, hyperlipidemia, hypertension, chronic pain syndrome, chronic regional pain syndrome of right lower limb, chronic constipation, HIV and HSV who presents for chronic follow-up.  Chronic pain syndrome/regional pain syndrome: On meloxicam 15 mg daily, on oxycodone 20 mg 4 times daily as needed.  Insomnia: diazepam 5 mg 1-2 at bedtime.   HIV: On Odefsey.  Per the patient his T-cell numbers are nondetectible. Sees ID doctor - Dr. Tobie Poet also.   Hypertension: On hydrochlorothiazide 25 mg nightly.  GERD: On Nexium 40 mg twice daily.  Constipation: On Linzess 72 mcg every morning.  Hyperlipidemia: On Lipitor 20 mg nightly.  Complaining of allergies.  Patient is requesting a Kenalog shot today.  Mainly he is got some runny nose and sneezing.  He says he gets a Kenalog shot about every 6 months.  Patient also reports a history of hepatitis but he does not know what type this was many years ago when he lived in [place].  Current Outpatient Medications on File Prior to Visit  Medication Sig Dispense Refill   albuterol (VENTOLIN HFA) 108 (90 Base) MCG/ACT inhaler TAKE 2 PUFFS BY MOUTH EVERY 6 HOURS AS NEEDED FOR WHEEZE OR SHORTNESS OF BREATH 17 each 3   atorvastatin (LIPITOR) 20 MG tablet TAKE 1 TABLET BY MOUTH EVERY DAY 30 tablet 0   diazepam (VALIUM) 5 MG tablet Take 10 mg by mouth at bedtime. scheduled     esomeprazole (NEXIUM) 40 MG capsule TAKE 1 CAPSULE BY MOUTH TWICE A DAY 180 capsule 2   fluticasone (FLONASE) 50 MCG/ACT nasal spray SPRAY 1 SPRAY INTO EACH NOSTRIL EVERY DAY AS NEEDED FOR ALLERGIES 48 mL 2   hydrochlorothiazide (HYDRODIURIL) 25 MG tablet Take 25 mg by mouth every evening.      LINZESS 72 MCG capsule TAKE 1 CAPSULE BY MOUTH DAILY BEFORE BREAKFAST. 30 capsule 6    meloxicam (MOBIC) 15 MG tablet Take 15 mg by mouth daily.     ODEFSEY 200-25-25 MG TABS tablet Take 1 tablet by mouth every morning.     valACYclovir (VALTREX) 500 MG tablet Take 1 tablet by mouth daily as needed (outbreaks).      No current facility-administered medications on file prior to visit.   Past Medical History:  Diagnosis Date   Arthritis    GERD (gastroesophageal reflux disease)    HIV (human immunodeficiency virus infection) ([place])    Hyperlipidemia    Internal hemorrhoids    Long-term current use of opiate analgesic 03/14/2020   Presence of tooth-root and mandibular implants 05/29/2020   Rash left leg-- using topical cream   Spondylosis with myelopathy, 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   Past Surgical History:  Procedure Laterality Date   APPENDECTOMY  1981   CHOLECYSTECTOMY  2006   dental implants  11/2020   FOOT SURGERY     HAMMER TOE SURGERY  OCT 2010   INTERSTIM IMPLANT PLACEMENT  06/17/2012   Procedure: Barrie Lyme IMPLANT FIRST STAGE;  Surgeon: Reece Packer, MD;  Location: [facility];  Service: Urology;  Laterality: N/A;  RAD TECH OK PER VICKIE AT MAIN OR    INTERSTIM IMPLANT PLACEMENT  06/17/2012   Procedure: INTERSTIM IMPLANT SECOND STAGE;  Surgeon: Reece Packer, MD;  Location: [facility];  Service: Urology;  Laterality:  N/A;   MOUTH SURGERY  02/16/2020   REPAIR RECURRENT RIGHT INGUINAL HERNIA  2000   RIGHT INGUINAL HERNIA REPAIR  1610   UMBILICAL HERNIA REPAIR  2010    Family History  Problem Relation Age of Onset   Other Mother        colon resection, tumor near liver   Diabetes Mother    Hyperlipidemia Mother    Hypertension Mother    Varicose Veins Mother    Heart Problems Father    Diabetes Father    Heart disease Father    Hyperlipidemia Father    Hypertension Father    Pancreatic cancer Neg Hx    Rectal cancer Neg Hx    Stomach cancer Neg Hx    Colon cancer Neg Hx    Social History   Socioeconomic  History   Marital status: Significant Other    Spouse name: Huntley Dec   Number of children: 1   Years of education: College   Highest education level: Not on file  Occupational History    Employer: OTHER    Comment: disability  Tobacco Use   Smoking status: Former    Packs/day: 1.00    Years: 30.00    Total pack years: 30.00    Types: Cigarettes    Quit date: 06/11/2010    Years since quitting: 12.2   Smokeless tobacco: Never  Vaping Use   Vaping Use: Some days  Substance and Sexual Activity   Alcohol use: Yes    Alcohol/week: 2.0 standard drinks of alcohol    Types: 2 Cans of beer per week    Comment: rarely   Drug use: No   Sexual activity: Not Currently    Partners: Male  Other Topics Concern   Not on file  Social History Narrative   Patient lives at home with partner.   Caffeine Use: 32oz daily coffee/tea      Disability.    Social Determinants of Health   Financial Resource Strain: Not on file  Food Insecurity: Not on file  Transportation Needs: Not on file  Physical Activity: Not on file  Stress: Not on file  Social Connections: Not on file    Review of Systems  Constitutional:  Negative for chills and fever.  HENT:  Positive for sneezing. Negative for congestion, rhinorrhea and sore throat.   Respiratory:  Negative for cough and shortness of breath.   Cardiovascular:  Negative for chest pain and palpitations.  Gastrointestinal:  Positive for constipation (controlled wlth linzess). Negative for abdominal pain, diarrhea, nausea and vomiting.  Genitourinary:  Negative for dysuria and urgency.  Musculoskeletal:  Negative for arthralgias, back pain and myalgias.  Neurological:  Positive for headaches. Negative for dizziness.  Psychiatric/Behavioral:  Negative for dysphoric mood. The patient is not nervous/anxious.      Objective:  BP 110/70   Pulse 76   Temp (!) 96.9 F (36.1 C)   Resp 18   Ht 5\' 10"  (1.778 m)   Wt 194 lb (88 kg)   BMI 27.84  kg/m      09/08/2022   11:40 AM 09/08/2022   10:53 AM 05/13/2022   10:29 AM  BP/Weight  Systolic BP 960 454 098  Diastolic BP 70 60 60  Wt. (Lbs)  194 188  BMI  27.84 kg/m2 30.81 kg/m2    Physical Exam Vitals reviewed.  Constitutional:      Appearance: Normal appearance.  Neck:     Vascular: No carotid bruit.  Cardiovascular:  Rate and Rhythm: Normal rate and regular rhythm.     Heart sounds: Normal heart sounds.  Pulmonary:     Effort: Pulmonary effort is normal.     Breath sounds: Normal breath sounds. No wheezing, rhonchi or rales.  Abdominal:     General: Bowel sounds are normal.     Palpations: Abdomen is soft.     Tenderness: There is no abdominal tenderness.  Neurological:     Mental Status: He is alert.  Psychiatric:        Mood and Affect: Mood normal.        Behavior: Behavior normal.     Diabetic Foot Exam - Simple   No data filed      Lab Results  Component Value Date   WBC 9.6 09/08/2022   HGB 15.0 09/08/2022   HCT 44.5 09/08/2022   PLT 195 09/08/2022   GLUCOSE 96 09/08/2022   CHOL 167 09/08/2022   TRIG 86 09/08/2022   HDL 60 09/08/2022   LDLCALC 91 09/08/2022   ALT 16 09/08/2022   AST 21 09/08/2022   NA 138 09/08/2022   K 4.3 09/08/2022   CL 101 09/08/2022   CREATININE 1.23 09/08/2022   BUN 22 09/08/2022   CO2 24 09/08/2022      Assessment & Plan:   Problem List Items Addressed This Visit       Cardiovascular and Mediastinum   Essential hypertension, benign    Well controlled.  No changes to medicines.Taking hydrochlorothiazide 25 mg nightly Continue to work on eating a healthy diet and exercise.  Labs drawn today.         Respiratory   Allergic rhinitis    Kenalog injection 80 mg IM  #1        Digestive   GERD (gastroesophageal reflux disease)    The current medical regimen is effective;  continue present plan and medications. Nexium 40 mg twice daily.      Chronic constipation    The current medical  regimen is effective;  continue present plan and medications. Linzess 72 mcg every morning.        Nervous and Auditory   Complex regional pain syndrome I of lower limb    The current medical regimen is effective;  continue present plan and medications. On meloxicam 15 mg daily, he oxycodone 20 mg 4 times daily as needed.      Relevant Orders   Pain Mgt Scrn (14 Drugs), Ur (Completed)     Other   HIV (human immunodeficiency virus infection) ([place])    Management per specialist.      Mixed hyperlipidemia - Primary    Well controlled.  No changes to medicines. Lipitor 20 mg nightly Continue to work on eating a healthy diet and exercise.  Labs drawn today.       Relevant Orders   CBC with Differential/Platelet (Completed)   Comprehensive metabolic panel (Completed)   Lipid panel (Completed)   Opioid use    Check labs.     .  Meds ordered this encounter  Medications   triamcinolone acetonide (KENALOG-40) injection 80 mg   promethazine (PHENERGAN) 25 MG tablet    Sig: Take 1 tablet (25 mg total) by mouth every 8 (eight) hours as needed for nausea or vomiting.    Dispense:  30 tablet    Refill:  3    Orders Placed This Encounter  Procedures   CBC with Differential/Platelet   Comprehensive metabolic panel   Lipid panel  Pain Mgt Scrn (14 Drugs), Ur   Cardiovascular Risk Assessment     Follow-up: Return in about 3 months (around 12/09/2022) for chronic fasting.  Thompson Caul, acting as a Education administrator for Rochel Brome, MD.,have documented all relevant documentation on the behalf of Rochel Brome, MD,as directed by  Rochel Brome, MD while in the presence of Rochel Brome, MD.   An After Visit Summary was printed and given to the patient.  Rochel Brome, MD [facility] [phone]

## 2022-09-09 LAB — COMPREHENSIVE METABOLIC PANEL
ALT: 16 IU/L (ref 0–44)
AST: 21 IU/L (ref 0–40)
Albumin/Globulin Ratio: 1.9 (ref 1.2–2.2)
Albumin: 4.3 g/dL (ref 3.8–4.9)
Alkaline Phosphatase: 84 IU/L (ref 44–121)
BUN/Creatinine Ratio: 18 (ref 9–20)
BUN: 22 mg/dL (ref 6–24)
Bilirubin Total: 0.3 mg/dL (ref 0.0–1.2)
CO2: 24 mmol/L (ref 20–29)
Calcium: 9.2 mg/dL (ref 8.7–10.2)
Chloride: 101 mmol/L (ref 96–106)
Creatinine, Ser: 1.23 mg/dL (ref 0.76–1.27)
Globulin, Total: 2.3 g/dL (ref 1.5–4.5)
Glucose: 96 mg/dL (ref 70–99)
Potassium: 4.3 mmol/L (ref 3.5–5.2)
Sodium: 138 mmol/L (ref 134–144)
Total Protein: 6.6 g/dL (ref 6.0–8.5)
eGFR: 68 mL/min/{1.73_m2} (ref >59)

## 2022-09-09 LAB — CBC WITH DIFFERENTIAL/PLATELET
Basophils Absolute: 0.1 10*3/uL (ref 0.0–0.2)
Basos: 1 %
EOS (ABSOLUTE): 0.2 10*3/uL (ref 0.0–0.4)
Eos: 2 %
Hematocrit: 44.5 % (ref 37.5–51.0)
Hemoglobin: 15 g/dL (ref 13.0–17.7)
Immature Grans (Abs): 0.1 10*3/uL (ref 0.0–0.1)
Immature Granulocytes: 1 %
Lymphocytes Absolute: 2.9 10*3/uL (ref 0.7–3.1)
Lymphs: 30 %
MCH: 32.6 pg (ref 26.6–33.0)
MCHC: 33.7 g/dL (ref 31.5–35.7)
MCV: 97 fL (ref 79–97)
Monocytes Absolute: 1.1 10*3/uL — ABNORMAL HIGH (ref 0.1–0.9)
Monocytes: 11 %
Neutrophils Absolute: 5.4 10*3/uL (ref 1.4–7.0)
Neutrophils: 55 %
Platelets: 195 10*3/uL (ref 150–450)
RBC: 4.6 x10E6/uL (ref 4.14–5.80)
RDW: 12.3 % (ref 11.6–15.4)
WBC: 9.6 10*3/uL (ref 3.4–10.8)

## 2022-09-09 LAB — PAIN MGT SCRN (14 DRUGS), UR
Amphetamine Scrn, Ur: NEGATIVE ng/mL
BARBITURATE SCREEN URINE: NEGATIVE ng/mL
BENZODIAZEPINE SCREEN, URINE: NEGATIVE ng/mL
Buprenorphine, Urine: NEGATIVE ng/mL
CANNABINOIDS UR QL SCN: NEGATIVE ng/mL
Cocaine (Metab) Scrn, Ur: NEGATIVE ng/mL
Creatinine(Crt), U: 128.2 mg/dL (ref 20.0–300.0)
Fentanyl, Urine: NEGATIVE pg/mL
Meperidine Screen, Urine: NEGATIVE ng/mL
Methadone Screen, Urine: NEGATIVE ng/mL
OXYCODONE+OXYMORPHONE UR QL SCN: POSITIVE ng/mL — AB
Opiate Scrn, Ur: POSITIVE ng/mL — AB
Ph of Urine: 5.1 (ref 4.5–8.9)
Phencyclidine Qn, Ur: NEGATIVE ng/mL
Propoxyphene Scrn, Ur: NEGATIVE ng/mL
Tramadol Screen, Urine: NEGATIVE ng/mL

## 2022-09-09 LAB — LIPID PANEL
Chol/HDL Ratio: 2.8 ratio (ref 0.0–5.0)
Cholesterol, Total: 167 mg/dL (ref 100–199)
HDL: 60 mg/dL (ref >39)
LDL Chol Calc (NIH): 91 mg/dL (ref 0–99)
Triglycerides: 86 mg/dL (ref 0–149)
VLDL Cholesterol Cal: 16 mg/dL (ref 5–40)

## 2022-09-09 LAB — CARDIOVASCULAR RISK ASSESSMENT

## 2022-09-14 DIAGNOSIS — F1190 Opioid use, unspecified, uncomplicated: Secondary | ICD-10-CM

## 2022-09-14 HISTORY — DX: Opioid use, unspecified, uncomplicated: F11.90

## 2022-09-14 NOTE — Assessment & Plan Note (Signed)
The current medical regimen is effective;  continue present plan and medications. On meloxicam 15 mg daily, he oxycodone 20 mg 4 times daily as needed.

## 2022-09-14 NOTE — Assessment & Plan Note (Signed)
Kenalog injection 80 mg IM  #1

## 2022-09-14 NOTE — Assessment & Plan Note (Signed)
Check labs 

## 2022-09-14 NOTE — Assessment & Plan Note (Signed)
Well controlled.  No changes to medicines. Lipitor 20 mg nightly Continue to work on eating a healthy diet and exercise.  Labs drawn today.

## 2022-09-14 NOTE — Assessment & Plan Note (Signed)
The current medical regimen is effective;  continue present plan and medications. Nexium 40 mg twice daily.

## 2022-09-14 NOTE — Assessment & Plan Note (Signed)
Management per specialist. 

## 2022-09-14 NOTE — Assessment & Plan Note (Signed)
The current medical regimen is effective;  continue present plan and medications. Linzess 72 mcg every morning.

## 2022-09-14 NOTE — Assessment & Plan Note (Signed)
Well controlled.  No changes to medicines.Taking hydrochlorothiazide 25 mg nightly Continue to work on eating a healthy diet and exercise.  Labs drawn today.

## 2022-09-17 NOTE — Progress Notes (Signed)
Blood count normal.  Liver function normal.  Kidney function normal.  Cholesterol is good. UDS consistent with current medications.

## 2022-09-19 ENCOUNTER — Other Ambulatory Visit: Payer: Self-pay | Admitting: Legal Medicine

## 2022-09-29 ENCOUNTER — Other Ambulatory Visit: Payer: Self-pay | Admitting: Physician Assistant

## 2022-10-08 ENCOUNTER — Other Ambulatory Visit: Payer: Self-pay

## 2022-10-08 MED ORDER — OXYCODONE HCL 20 MG PO TABS: 20.0000 mg | ORAL_TABLET | Freq: Four times a day (QID) | ORAL | 0 refills | Status: DC | PRN

## 2022-11-06 ENCOUNTER — Telehealth: Payer: Self-pay

## 2022-11-10 ENCOUNTER — Other Ambulatory Visit: Payer: Self-pay

## 2022-11-10 MED ORDER — OXYCODONE HCL 20 MG PO TABS: 20.0000 mg | ORAL_TABLET | Freq: Four times a day (QID) | ORAL | 0 refills | Status: DC | PRN

## 2022-11-11 ENCOUNTER — Telehealth: Payer: Self-pay

## 2022-11-11 NOTE — Telephone Encounter (Signed)
Left detailed message for patient.

## 2022-11-11 NOTE — Telephone Encounter (Signed)
Patient called because he needs a prescripton for his pain medicaton.

## 2022-12-04 DIAGNOSIS — Z0100 Encounter for examination of eyes and vision without abnormal findings: Secondary | ICD-10-CM | POA: Diagnosis not present

## 2022-12-09 ENCOUNTER — Other Ambulatory Visit: Payer: Self-pay

## 2022-12-09 MED ORDER — OXYCODONE HCL 20 MG PO TABS: 20.0000 mg | ORAL_TABLET | Freq: Four times a day (QID) | ORAL | 0 refills | Status: DC | PRN

## 2022-12-17 ENCOUNTER — Ambulatory Visit (INDEPENDENT_AMBULATORY_CARE_PROVIDER_SITE_OTHER): Payer: Medicare HMO | Admitting: Family Medicine

## 2022-12-17 VITALS — BP 106/70 | HR 78 | Temp 97.4°F | Resp 16 | Ht 70.0 in | Wt 198.0 lb

## 2022-12-17 DIAGNOSIS — Z21 Asymptomatic human immunodeficiency virus [HIV] infection status: Secondary | ICD-10-CM

## 2022-12-17 DIAGNOSIS — I10 Essential (primary) hypertension: Secondary | ICD-10-CM | POA: Diagnosis not present

## 2022-12-17 DIAGNOSIS — K2100 Gastro-esophageal reflux disease with esophagitis, without bleeding: Secondary | ICD-10-CM

## 2022-12-17 DIAGNOSIS — G90521 Complex regional pain syndrome I of right lower limb: Secondary | ICD-10-CM

## 2022-12-17 DIAGNOSIS — Z01818 Encounter for other preprocedural examination: Secondary | ICD-10-CM | POA: Diagnosis not present

## 2022-12-17 DIAGNOSIS — K5909 Other constipation: Secondary | ICD-10-CM | POA: Diagnosis not present

## 2022-12-17 DIAGNOSIS — E782 Mixed hyperlipidemia: Secondary | ICD-10-CM

## 2022-12-17 DIAGNOSIS — F1120 Opioid dependence, uncomplicated: Secondary | ICD-10-CM

## 2022-12-17 DIAGNOSIS — R69 Illness, unspecified: Secondary | ICD-10-CM | POA: Diagnosis not present

## 2022-12-17 MED ORDER — HYDROCHLOROTHIAZIDE 25 MG PO TABS: 25.0000 mg | ORAL_TABLET | Freq: Every evening | ORAL | 3 refills | Status: DC

## 2022-12-17 NOTE — Progress Notes (Unsigned)
Subjective:  Patient ID: Kevin Pena, male    DOB: [birth year]  Age: 57 y.o. MRN: [id number]  Chief Complaint  Patient presents with   Hyperlipidemia    HPI Patient is a 57 year old white male with GERD, hyperlipidemia, hypertension, chronic pain syndrome, chronic regional pain syndrome of right lower limb, chronic constipation, HIV and HSV who presents for chronic follow-up.  Chronic pain syndrome/regional pain syndrome: On meloxicam 15 mg daily, on oxycodone 20 mg 4 times daily as needed. Complex regional pain syndrome 1 of lower right leg.   Insomnia: Using sleep app and it is helping. Does not need diazepam any more.   HIV: On Odefsey.  Per the patient his T-cell numbers are nondetectible. Sees ID doctor - Dr. Tobie Poet also.   Hypertension: On hydrochlorothiazide 25 mg nightly. Came off of it for a while, but bp went up a little so restarted it.   GERD: On Nexium 40 mg twice daily. If goes down to once daily, develops reflux.   Constipation: On Linzess 72 mcg every morning.  Hyperlipidemia: On Lipitor 20 mg nightly.  Patient has upcoming dental surgery.      12/17/2022   10:35 AM 09/08/2022   11:00 AM 05/13/2022   10:36 AM 07/04/2021    9:00 AM 03/27/2021   10:01 AM  Depression screen PHQ 2/9  Decreased Interest 0 0 0 0 0  Down, Depressed, Hopeless 0 0 0 0 0  PHQ - 2 Score 0 0 0 0 0  Altered sleeping 0   0 0  Tired, decreased energy 0   0 0  Change in appetite 0   0 0  Feeling bad or failure about yourself  0   0 0  Trouble concentrating 0   0 0  Moving slowly or fidgety/restless 0   0 0  Suicidal thoughts 0   0 0  PHQ-9 Score 0   0 0  Difficult doing work/chores Not difficult at all   Not difficult at all Not difficult at all         06/14/2020   10:27 AM 03/27/2021   10:01 AM 05/13/2022   10:35 AM 09/08/2022   10:59 AM  Fall Risk  Falls in the past year? 0 0 0   Was there an injury with Fall? 0 0 0 0  Fall Risk Category Calculator 0 0 0   Fall Risk Category  (Retired) Low Low Low   (RETIRED) Patient Fall Risk Level Low fall risk Low fall risk Low fall risk   Patient at Risk for Falls Due to  No Fall Risks No Fall Risks No Fall Risks  Fall risk Follow up Falls evaluation completed  Falls evaluation completed Falls evaluation completed      Review of Systems  Constitutional:  Negative for chills and fever.  HENT:  Positive for rhinorrhea. Negative for congestion and sore throat.   Respiratory:  Negative for cough and shortness of breath.   Cardiovascular:  Negative for chest pain and palpitations.  Gastrointestinal:  Negative for abdominal pain, constipation, diarrhea, nausea and vomiting.  Genitourinary:  Negative for dysuria and urgency.  Musculoskeletal:  Positive for myalgias. Negative for arthralgias and back pain.  Neurological:  Negative for dizziness and headaches.  Psychiatric/Behavioral:  Negative for dysphoric mood. The patient is not nervous/anxious.     Current Outpatient Medications on File Prior to Visit  Medication Sig Dispense Refill   albuterol (VENTOLIN HFA) 108 (90 Base) MCG/ACT inhaler TAKE 2 PUFFS BY MOUTH  EVERY 6 HOURS AS NEEDED FOR WHEEZE OR SHORTNESS OF BREATH 17 each 3   atorvastatin (LIPITOR) 20 MG tablet TAKE 1 TABLET BY MOUTH EVERY DAY 90 tablet 0   esomeprazole (NEXIUM) 40 MG capsule TAKE 1 CAPSULE BY MOUTH TWICE A DAY 180 capsule 2   fluticasone (FLONASE) 50 MCG/ACT nasal spray SPRAY 1 SPRAY INTO EACH NOSTRIL EVERY DAY AS NEEDED FOR ALLERGIES 48 mL 2   LINZESS 72 MCG capsule TAKE 1 CAPSULE BY MOUTH DAILY BEFORE BREAKFAST. 30 capsule 6   ODEFSEY 200-25-25 MG TABS tablet Take 1 tablet by mouth every morning.     Oxycodone HCl 20 MG TABS Take 1 tablet (20 mg total) by mouth 4 (four) times daily as needed. 120 tablet 0   promethazine (PHENERGAN) 25 MG tablet Take 1 tablet (25 mg total) by mouth every 8 (eight) hours as needed for nausea or vomiting. 30 tablet 3   valACYclovir (VALTREX) 500 MG tablet Take 1 tablet by  mouth daily as needed (outbreaks).      No current facility-administered medications on file prior to visit.   Past Medical History:  Diagnosis Date   Anogenital herpes simplex virus (HSV) infection 09/01/2014   Arthritis    GERD (gastroesophageal reflux disease)    HIV (human immunodeficiency virus infection) ([place])    Hyperlipidemia    Internal hemorrhoids    Long-term current use of opiate analgesic 03/14/2020   OSA (obstructive sleep apnea) 09/25/2016   Presence of tooth-root and mandibular implants 05/29/2020   Rash left leg-- using topical cream   Spondylosis with myelopathy, 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   Past Surgical History:  Procedure Laterality Date   APPENDECTOMY  1981   CHOLECYSTECTOMY  2006   dental implants  11/2020   FOOT SURGERY     HAMMER TOE SURGERY  OCT 2010   INTERSTIM IMPLANT PLACEMENT  06/17/2012   Procedure: Barrie Lyme IMPLANT FIRST STAGE;  Surgeon: Reece Packer, MD;  Location: [facility];  Service: Urology;  Laterality: N/A;  RAD TECH OK PER VICKIE AT MAIN OR    INTERSTIM IMPLANT PLACEMENT  06/17/2012   Procedure: INTERSTIM IMPLANT SECOND STAGE;  Surgeon: Reece Packer, MD;  Location: [facility];  Service: Urology;  Laterality: N/A;   MOUTH SURGERY  02/16/2020   REPAIR RECURRENT RIGHT INGUINAL HERNIA  2000   RIGHT INGUINAL HERNIA REPAIR  5701   UMBILICAL HERNIA REPAIR  2010    Family History  Problem Relation Age of Onset   Other Mother        colon resection, tumor near liver   Diabetes Mother    Hyperlipidemia Mother    Hypertension Mother    Varicose Veins Mother    Heart Problems Father    Diabetes Father    Heart disease Father    Hyperlipidemia Father    Hypertension Father    Pancreatic cancer Neg Hx    Rectal cancer Neg Hx    Stomach cancer Neg Hx    Colon cancer Neg Hx    Social History   Socioeconomic History   Marital status: Significant Other    Spouse name: Huntley Dec   Number of  children: 1   Years of education: College   Highest education level: Not on file  Occupational History    Employer: OTHER    Comment: disability  Tobacco Use   Smoking status: Former    Packs/day: 1.00    Years: 30.00    Total pack years:  30.00    Types: Cigarettes    Quit date: 06/11/2010    Years since quitting: 12.5   Smokeless tobacco: Never  Vaping Use   Vaping Use: Some days  Substance and Sexual Activity   Alcohol use: Yes    Alcohol/week: 2.0 standard drinks of alcohol    Types: 2 Cans of beer per week    Comment: rarely   Drug use: No   Sexual activity: Not Currently    Partners: Male  Other Topics Concern   Not on file  Social History Narrative   Patient lives at home with partner.   Caffeine Use: 32oz daily coffee/tea      Disability.    Social Determinants of Health   Financial Resource Strain: Not on file  Food Insecurity: Not on file  Transportation Needs: Not on file  Physical Activity: Not on file  Stress: Not on file  Social Connections: Not on file    Objective:  BP 106/70   Pulse 78   Temp (!) 97.4 F (36.3 C)   Resp 16   Ht 5\' 10"  (1.778 m)   Wt 198 lb (89.8 kg)   BMI 28.41 kg/m      12/17/2022   10:30 AM 09/08/2022   11:40 AM 09/08/2022   10:53 AM  BP/Weight  Systolic BP 106 110 140  Diastolic BP 70 70 60  Wt. (Lbs) 198  194  BMI 28.41 kg/m2  27.84 kg/m2    Physical Exam Constitutional:      Appearance: Normal appearance. He is normal weight.  Neck:     Vascular: No carotid bruit.  Cardiovascular:     Rate and Rhythm: Normal rate and regular rhythm.     Pulses: Normal pulses.     Heart sounds: Normal heart sounds.  Pulmonary:     Effort: Pulmonary effort is normal.     Breath sounds: Normal breath sounds.  Abdominal:     General: Bowel sounds are normal.     Palpations: Abdomen is soft. There is no mass.     Tenderness: There is no abdominal tenderness.  Neurological:     Mental Status: He is alert and oriented to  person, place, and time.     Gait: Gait abnormal (walks with rt leg limp.).  Psychiatric:        Mood and Affect: Mood normal.        Behavior: Behavior normal.        Thought Content: Thought content normal.     Diabetic Foot Exam - Simple   No data filed      Lab Results  Component Value Date   WBC 9.6 09/08/2022   HGB 15.0 09/08/2022   HCT 44.5 09/08/2022   PLT 195 09/08/2022   GLUCOSE 96 09/08/2022   CHOL 167 09/08/2022   TRIG 86 09/08/2022   HDL 60 09/08/2022   LDLCALC 91 09/08/2022   ALT 16 09/08/2022   AST 21 09/08/2022   NA 138 09/08/2022   K 4.3 09/08/2022   CL 101 09/08/2022   CREATININE 1.23 09/08/2022   BUN 22 09/08/2022   CO2 24 09/08/2022      Assessment & Plan:    Chronic narcotic dependence (HCC) Assessment & Plan: Drug screen due to pain management. The current medical regimen is effective;  continue present plan and medications.   Orders: -     Pain Mgt Scrn (14 Drugs), Ur  Asymptomatic HIV infection, with no history of HIV-related illness (HCC) Assessment &  Plan: Follows with infectious disease.  The current medical regimen is effective;  continue present plan and medications.    Mixed hyperlipidemia Assessment & Plan: Well controlled.  No changes to medicines.  Continue to work on eating a healthy diet and exercise.      Chronic constipation Assessment & Plan: The current medical regimen is effective;  continue present plan and medications.continue linzess 72 mcg once daily.     Complex regional pain syndrome type 1 of right lower extremity Assessment & Plan: The current medical regimen is effective;  continue present plan and medications.    Primary hypertension Assessment & Plan: The current medical regimen is effective;  continue present plan and medications.    Gastroesophageal reflux disease with esophagitis without hemorrhage Assessment & Plan: The current medical regimen is effective;  continue present plan  and medications. Continue nexium 40 mg twice daily.    Preoperative clearance Assessment & Plan: Cleared for dental surgery.  Sent most recent labs.    Other orders -     hydroCHLOROthiazide; Take 1 tablet (25 mg total) by mouth every evening.  Dispense: 90 tablet; Refill: 3     Meds ordered this encounter  Medications   hydrochlorothiazide (HYDRODIURIL) 25 MG tablet    Sig: Take 1 tablet (25 mg total) by mouth every evening.    Dispense:  90 tablet    Refill:  3    Orders Placed This Encounter  Procedures   Pain Mgt Scrn (14 Drugs), Ur     Follow-up: Return in about 3 months (around 03/18/2023) for chronic follow up.  An After Visit Summary was printed and given to the patient.  I,Shariece Viveiros,acting as a Neurosurgeon for Blane Ohara, MD.,have documented all relevant documentation on the behalf of Blane Ohara, MD,as directed by  Blane Ohara, MD while in the presence of Blane Ohara, MD.   Blane Ohara, MD [facility] [phone]

## 2022-12-18 LAB — PAIN MGT SCRN (14 DRUGS), UR
Amphetamine Scrn, Ur: NEGATIVE ng/mL
BARBITURATE SCREEN URINE: NEGATIVE ng/mL
BENZODIAZEPINE SCREEN, URINE: NEGATIVE ng/mL
Buprenorphine, Urine: NEGATIVE ng/mL
CANNABINOIDS UR QL SCN: NEGATIVE ng/mL
Cocaine (Metab) Scrn, Ur: NEGATIVE ng/mL
Creatinine(Crt), U: 125.9 mg/dL (ref 20.0–300.0)
Fentanyl, Urine: NEGATIVE pg/mL
Meperidine Screen, Urine: NEGATIVE ng/mL
Methadone Screen, Urine: NEGATIVE ng/mL
OXYCODONE+OXYMORPHONE UR QL SCN: POSITIVE ng/mL — AB
Opiate Scrn, Ur: POSITIVE ng/mL — AB
Ph of Urine: 5.3 (ref 4.5–8.9)
Phencyclidine Qn, Ur: NEGATIVE ng/mL
Propoxyphene Scrn, Ur: NEGATIVE ng/mL
Tramadol Screen, Urine: NEGATIVE ng/mL

## 2022-12-21 DIAGNOSIS — F1120 Opioid dependence, uncomplicated: Secondary | ICD-10-CM | POA: Insufficient documentation

## 2022-12-21 HISTORY — DX: Opioid dependence, uncomplicated: F11.20

## 2022-12-21 NOTE — Assessment & Plan Note (Signed)
Drug screen due to pain management. The current medical regimen is effective;  continue present plan and medications.

## 2022-12-24 ENCOUNTER — Encounter: Payer: Self-pay | Admitting: Family Medicine

## 2022-12-24 DIAGNOSIS — Z01818 Encounter for other preprocedural examination: Secondary | ICD-10-CM

## 2022-12-24 HISTORY — DX: Encounter for other preprocedural examination: Z01.818

## 2022-12-24 NOTE — Assessment & Plan Note (Signed)
The current medical regimen is effective;  continue present plan and medications. Continue nexium 40 mg twice daily.

## 2022-12-24 NOTE — Assessment & Plan Note (Signed)
Follows with infectious disease.  The current medical regimen is effective;  continue present plan and medications.

## 2022-12-24 NOTE — Assessment & Plan Note (Signed)
Well controlled. No changes to medicines.  Continue to work on eating a healthy diet and exercise.    

## 2022-12-24 NOTE — Assessment & Plan Note (Signed)
The current medical regimen is effective;  continue present plan and medications.  

## 2022-12-24 NOTE — Assessment & Plan Note (Signed)
The current medical regimen is effective;  continue present plan and medications.continue linzess 72 mcg once daily.

## 2022-12-24 NOTE — Assessment & Plan Note (Signed)
Cleared for dental surgery.  Sent most recent labs.

## 2022-12-31 ENCOUNTER — Other Ambulatory Visit: Payer: Self-pay | Admitting: Family Medicine

## 2022-12-31 ENCOUNTER — Other Ambulatory Visit: Payer: Self-pay | Admitting: Physician Assistant

## 2023-01-07 ENCOUNTER — Other Ambulatory Visit: Payer: Self-pay

## 2023-01-07 MED ORDER — OXYCODONE HCL 20 MG PO TABS: 20.0000 mg | ORAL_TABLET | Freq: Four times a day (QID) | ORAL | 0 refills | Status: DC | PRN

## 2023-01-29 ENCOUNTER — Telehealth: Payer: Self-pay

## 2023-01-29 NOTE — Telephone Encounter (Signed)
Contacted Kevin Pena to schedule their annual wellness visit. Patient declined to schedule AWV at this time.  Patient states he will call back to schedule.  Norton Blizzard, [place] ([facility])  [facility] [phone]

## 2023-02-03 ENCOUNTER — Other Ambulatory Visit: Payer: Self-pay

## 2023-02-03 MED ORDER — OXYCODONE HCL 20 MG PO TABS: 20.0000 mg | ORAL_TABLET | Freq: Four times a day (QID) | ORAL | 0 refills | Status: DC | PRN

## 2023-02-09 DIAGNOSIS — Z23 Encounter for immunization: Secondary | ICD-10-CM | POA: Diagnosis not present

## 2023-02-09 DIAGNOSIS — Z21 Asymptomatic human immunodeficiency virus [HIV] infection status: Secondary | ICD-10-CM | POA: Diagnosis not present

## 2023-02-09 DIAGNOSIS — I10 Essential (primary) hypertension: Secondary | ICD-10-CM | POA: Diagnosis not present

## 2023-02-09 DIAGNOSIS — Z79624 Long term (current) use of inhibitors of nucleotide synthesis: Secondary | ICD-10-CM | POA: Diagnosis not present

## 2023-02-09 DIAGNOSIS — Z1331 Encounter for screening for depression: Secondary | ICD-10-CM | POA: Diagnosis not present

## 2023-02-09 DIAGNOSIS — F32A Depression, unspecified: Secondary | ICD-10-CM | POA: Diagnosis not present

## 2023-02-09 DIAGNOSIS — R69 Illness, unspecified: Secondary | ICD-10-CM | POA: Diagnosis not present

## 2023-02-09 DIAGNOSIS — Z79899 Other long term (current) drug therapy: Secondary | ICD-10-CM | POA: Diagnosis not present

## 2023-02-09 DIAGNOSIS — F419 Anxiety disorder, unspecified: Secondary | ICD-10-CM | POA: Diagnosis not present

## 2023-02-09 DIAGNOSIS — K219 Gastro-esophageal reflux disease without esophagitis: Secondary | ICD-10-CM | POA: Diagnosis not present

## 2023-02-09 DIAGNOSIS — K589 Irritable bowel syndrome without diarrhea: Secondary | ICD-10-CM | POA: Diagnosis not present

## 2023-03-06 ENCOUNTER — Other Ambulatory Visit: Payer: Self-pay

## 2023-03-06 MED ORDER — OXYCODONE HCL 20 MG PO TABS: 20.0000 mg | ORAL_TABLET | Freq: Four times a day (QID) | ORAL | 0 refills | Status: DC | PRN

## 2023-03-26 ENCOUNTER — Ambulatory Visit: Payer: Medicare HMO | Admitting: Family Medicine

## 2023-03-27 ENCOUNTER — Other Ambulatory Visit: Payer: Self-pay | Admitting: Family Medicine

## 2023-04-06 ENCOUNTER — Other Ambulatory Visit: Payer: Self-pay

## 2023-04-06 MED ORDER — OXYCODONE HCL 20 MG PO TABS: 20.0000 mg | ORAL_TABLET | Freq: Four times a day (QID) | ORAL | 0 refills | Status: DC | PRN

## 2023-04-18 ENCOUNTER — Other Ambulatory Visit: Payer: Self-pay | Admitting: Family Medicine

## 2023-04-23 ENCOUNTER — Ambulatory Visit (INDEPENDENT_AMBULATORY_CARE_PROVIDER_SITE_OTHER): Payer: Medicare HMO

## 2023-04-23 DIAGNOSIS — Z0000 Encounter for general adult medical examination without abnormal findings: Secondary | ICD-10-CM | POA: Diagnosis not present

## 2023-04-23 NOTE — Progress Notes (Signed)
Subjective:   Kevin Pena is a 57 y.o. male who presents for Medicare Annual/Subsequent preventive examination. I connected with  Kevin Pena on 04/23/23 by a audio enabled telemedicine application and verified that I am speaking with the correct person using two identifiers.  Patient Location: Home  Provider Location: Office/Clinic  I discussed the limitations of evaluation and management by telemedicine. The patient expressed understanding and agreed to proceed.  Cardiac Risk Factors include: advanced age (>27men, >66 women);male gender;smoking/ tobacco exposure     Objective:    Today's Vitals   04/23/23 1546  PainSc: 7    There is no height or weight on file to calculate BMI.     06/23/2018    6:38 PM 03/20/2015    1:11 PM 02/07/2015    3:01 PM 07/27/2014   10:38 AM  Advanced Directives  Does Patient Have a Medical Advance Directive? No No No No  Would patient like information on creating a medical advance directive?  Yes - Educational materials given No - patient declined information No - patient declined information    Current Medications (verified) Outpatient Encounter Medications as of 04/23/2023  Medication Sig   albuterol (VENTOLIN HFA) 108 (90 Base) MCG/ACT inhaler TAKE 2 PUFFS BY MOUTH EVERY 6 HOURS AS NEEDED FOR WHEEZE OR SHORTNESS OF BREATH   atorvastatin (LIPITOR) 20 MG tablet TAKE 1 TABLET BY MOUTH EVERY DAY   esomeprazole (NEXIUM) 40 MG capsule TAKE 1 CAPSULE BY MOUTH TWICE A DAY   fluticasone (FLONASE) 50 MCG/ACT nasal spray SPRAY 1 SPRAY INTO EACH NOSTRIL EVERY DAY AS NEEDED FOR ALLERGIES   hydrochlorothiazide (HYDRODIURIL) 25 MG tablet Take 1 tablet (25 mg total) by mouth every evening.   LINZESS 72 MCG capsule TAKE 1 CAPSULE BY MOUTH DAILY BEFORE BREAKFAST.   ODEFSEY 200-25-25 MG TABS tablet Take 1 tablet by mouth every morning.   Oxycodone HCl 20 MG TABS Take 1 tablet (20 mg total) by mouth 4 (four) times daily as needed.   promethazine  (PHENERGAN) 25 MG tablet Take 1 tablet (25 mg total) by mouth every 8 (eight) hours as needed for nausea or vomiting.   valACYclovir (VALTREX) 500 MG tablet Take 1 tablet by mouth daily as needed (outbreaks).    No facility-administered encounter medications on file as of 04/23/2023.    Allergies (verified) Adhesive [tape], Ampicillin, Doxycycline, Hydroxyzine, and Lyrica [pregabalin]   History: Past Medical History:  Diagnosis Date   Anogenital herpes simplex virus (HSV) infection 09/01/2014   Arthritis    GERD (gastroesophageal reflux disease)    HIV (human immunodeficiency virus infection) (HCC)    Hyperlipidemia    Internal hemorrhoids    Long-term current use of opiate analgesic 03/14/2020   OSA (obstructive sleep apnea) 09/25/2016   Presence of tooth-root and mandibular implants 05/29/2020   Rash left leg-- using topical cream   Spondylosis with myelopathy, 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   Past Surgical History:  Procedure Laterality Date   APPENDECTOMY  1981   CHOLECYSTECTOMY  2006   dental implants  11/2020   FOOT SURGERY     HAMMER TOE SURGERY  OCT 2010   INTERSTIM IMPLANT PLACEMENT  06/17/2012   Procedure: Leane Platt IMPLANT FIRST STAGE;  Surgeon: Martina Sinner, MD;  Location: [facility] [place];  Service: Urology;  Laterality: N/A;  RAD TECH OK PER VICKIE AT MAIN OR    INTERSTIM IMPLANT PLACEMENT  06/17/2012   Procedure: INTERSTIM IMPLANT SECOND STAGE;  Surgeon: Martina Sinner, MD;  Location: [facility];  Service: Urology;  Laterality: N/A;   MOUTH SURGERY  02/16/2020   REPAIR RECURRENT RIGHT INGUINAL HERNIA  2000   RIGHT INGUINAL HERNIA REPAIR  1997   UMBILICAL HERNIA REPAIR  2010   Family History  Problem Relation Age of Onset   Other Mother        colon resection, tumor near liver   Diabetes Mother    Hyperlipidemia Mother    Hypertension Mother    Varicose Veins Mother    Heart Problems Father    Diabetes Father    Heart disease  Father    Hyperlipidemia Father    Hypertension Father    Pancreatic cancer Neg Hx    Rectal cancer Neg Hx    Stomach cancer Neg Hx    Colon cancer Neg Hx    Social History   Socioeconomic History   Marital status: Significant Other    Spouse name: Kevin Pena   Number of children: 1   Years of education: College   Highest education level: Not on file  Occupational History    Employer: OTHER    Comment: disability  Tobacco Use   Smoking status: Former    Packs/day: 1.00    Years: 30.00    Additional pack years: 0.00    Total pack years: 30.00    Types: Cigarettes    Quit date: 06/11/2010    Years since quitting: 12.8   Smokeless tobacco: Never  Vaping Use   Vaping Use: Some days  Substance and Sexual Activity   Alcohol use: Yes    Alcohol/week: 2.0 standard drinks of alcohol    Types: 2 Cans of beer per week    Comment: rarely   Drug use: No   Sexual activity: Not Currently    Partners: Male  Other Topics Concern   Not on file  Social History Narrative   Patient lives at home with partner.   Caffeine Use: 32oz daily coffee/tea      Disability.    Social Determinants of Health   Financial Resource Strain: Low Risk  (04/23/2023)   Overall Financial Resource Strain (CARDIA)    Difficulty of Paying Living Expenses: Not hard at all  Food Insecurity: No Food Insecurity (04/23/2023)   Hunger Vital Sign    Worried About Running Out of Food in the Last Year: Never true    Ran Out of Food in the Last Year: Never true  Transportation Needs: No Transportation Needs (04/23/2023)   PRAPARE - Administrator, Civil Service (Medical): No    Lack of Transportation (Non-Medical): No  Physical Activity: Sufficiently Active (04/23/2023)   Exercise Vital Sign    Days of Exercise per Week: 5 days    Minutes of Exercise per Session: 30 min  Stress: No Stress Concern Present (04/23/2023)   [employer] of Occupational Health - Occupational Stress Questionnaire     Feeling of Stress : Only a little  Social Connections: Not on file    Tobacco Counseling Counseling given: Not Answered   Clinical Intake:  Pre-visit preparation completed: Yes Pain : 0-10 Pain Score: 7  Pain Type: Chronic pain Pain Location: Generalized Pain Relieving Factors: Medication BMI - recorded: 28.41 Nutritional Status: BMI 25 -29 Overweight Nutritional Risks: None Diabetes: No How often do you need to have someone help you when you read instructions, pamphlets, or other written materials from your doctor or pharmacy?: 1 - Never Interpreter Needed?: No     Activities of  Daily Living    04/23/2023    2:48 PM  In your present state of health, do you have any difficulty performing the following activities:  Hearing? 0  Vision? 0  Difficulty concentrating or making decisions? 0  Walking or climbing stairs? 0  Dressing or bathing? 0  Doing errands, shopping? 0  Preparing Food and eating ? N  Using the Toilet? N  In the past six months, have you accidently leaked urine? N  Do you have problems with loss of bowel control? N  Managing your Medications? N  Managing your Finances? N  Housekeeping or managing your Housekeeping? N    Patient Care Team: Blane Ohara, MD as PCP - General (Internal Medicine) Cox, Janet Berlin, MD as Referring Physician (Infectious Diseases)     Assessment:   This is a routine wellness examination for Sharpes.  Hearing/Vision screen No results found.  Dietary issues and exercise activities discussed: Current Exercise Habits: Home exercise routine, Type of exercise: walking;stretching, Time (Minutes): 30, Frequency (Times/Week): 5, Weekly Exercise (Minutes/Week): 150, Intensity: Mild, Exercise limited by: None identified  Depression Screen    04/23/2023    2:48 PM 12/17/2022   10:35 AM 09/08/2022   11:00 AM 05/13/2022   10:36 AM 07/04/2021    9:00 AM 03/27/2021   10:01 AM 12/25/2020   10:26 AM  PHQ 2/9 Scores  PHQ - 2 Score 0 0 0 0  0 0 0  PHQ- 9 Score  0   0 0     Fall Risk    04/23/2023    2:47 PM 09/08/2022   10:59 AM 05/13/2022   10:35 AM 03/27/2021   10:01 AM 06/14/2020   10:27 AM  Fall Risk   Falls in the past year? 0  0 0 0  Number falls in past yr: 0 0 0 0 0  Injury with Fall? 0 0 0 0 0  Risk for fall due to : No Fall Risks No Fall Risks No Fall Risks No Fall Risks   Follow up Falls evaluation completed Falls evaluation completed Falls evaluation completed  Falls evaluation completed    FALL RISK PREVENTION PERTAINING TO THE HOME:  Any stairs in or around the home? No  If so, are there any without handrails? No  Home free of loose throw rugs in walkways, pet beds, electrical cords, etc? Yes  Adequate lighting in your home to reduce risk of falls? Yes   ASSISTIVE DEVICES UTILIZED TO PREVENT FALLS:  Life alert? No  Use of a cane, walker or w/c? No  Grab bars in the bathroom? No  Shower chair or bench in shower? No  Elevated toilet seat or a handicapped toilet? No   Cognitive Function:        04/23/2023    3:56 PM  6CIT Screen  What Year? 0 points  What month? 0 points  What time? 0 points  Count back from 20 0 points  Months in reverse 0 points  Repeat phrase 0 points  Total Score 0 points    Immunizations Immunization History  Administered Date(s) Administered   Influenza,inj,Quad PF,6+ Mos 11/19/2017, 10/25/2018, 01/28/2021, 09/30/2021   Influenza-Unspecified 09/24/2014   Moderna Sars-Covid-2 Vaccination 03/12/2020, 04/09/2020   Pneumococcal Conjugate-13 01/16/2016, 10/25/2018   Pneumococcal Polysaccharide-23 03/28/2019   Tdap 09/07/2014, 05/09/2019    TDAP status: Up to date  Flu Vaccine status: Declined, Education has been provided regarding the importance of this vaccine but patient still declined. Advised may receive this vaccine at  local pharmacy or Health Dept. Aware to provide a copy of the vaccination record if obtained from local pharmacy or Health Dept. Verbalized  acceptance and understanding.  Pneumococcal vaccine status: Up to date  Covid-19 vaccine status: Declined, Education has been provided regarding the importance of this vaccine but patient still declined. Advised may receive this vaccine at local pharmacy or Health Dept.or vaccine clinic. Aware to provide a copy of the vaccination record if obtained from local pharmacy or Health Dept. Verbalized acceptance and understanding.  Qualifies for Shingles Vaccine? Yes   Zostavax completed No   Shingrix Completed?: No.    Education has been provided regarding the importance of this vaccine. Patient has been advised to call insurance company to determine out of pocket expense if they have not yet received this vaccine. Advised may also receive vaccine at local pharmacy or Health Dept. Verbalized acceptance and understanding.  Screening Tests Health Maintenance  Topic Date Due   Hepatitis C Screening  Never done   COVID-19 Vaccine (3 - Moderna risk series) 05/07/2020   Lung Cancer Screening  06/23/2023 (Originally 03/09/2014)   Zoster Vaccines- Shingrix (1 of 2) 04/22/2024 (Originally 03/10/1983)   INFLUENZA VACCINE  06/25/2023   Medicare Annual Wellness (AWV)  04/22/2024   Colonoscopy  02/06/2025   DTaP/Tdap/Td (3 - Td or Tdap) 05/08/2029   HIV Screening  Completed   HPV VACCINES  Aged Out    Health Maintenance  Health Maintenance Due  Topic Date Due   Hepatitis C Screening  Never done   COVID-19 Vaccine (3 - Moderna risk series) 05/07/2020    Colorectal cancer screening: Type of screening: Colonoscopy. Completed 01/2015. Repeat every 10 years  Lung Cancer Screening: (Low Dose CT Chest recommended if Age 44-80 years, 30 pack-year currently smoking OR have quit w/in 15years.) does qualify.   Lung Cancer Screening Referral: "Maybe later"  Additional Screening:  Vision Screening: Recommended annual ophthalmology exams for early detection of glaucoma and other disorders of the eye. Is the  patient up to date with their annual eye exam?  Yes   Dental Screening: Recommended annual dental exams for proper oral hygiene  Community Resource Referral / Chronic Care Management: CRR required this visit?  No   CCM required this visit?  No      Plan:    1- CT Lung screening recommended 2- Increase activity as tolerated  I have personally reviewed and noted the following in the patient's chart:   Medical and social history Use of alcohol, tobacco or illicit drugs  Current medications and supplements including opioid prescriptions.  Functional ability and status Nutritional status Physical activity Advanced directives List of other physicians Hospitalizations, surgeries, and ER visits in previous 12 months Vitals Screenings to include cognitive, depression, and falls Referrals and appointments  In addition, I have reviewed and discussed with patient certain preventive protocols, quality metrics, and best practice recommendations. A written personalized care plan for preventive services as well as general preventive health recommendations were provided to patient.     Jacklynn Bue, LPN   [id number]

## 2023-04-23 NOTE — Patient Instructions (Signed)
Health Maintenance, Male Adopting a healthy lifestyle and getting preventive care are important in promoting health and wellness. Ask your health care provider about: The right schedule for you to have regular tests and exams. Things you can do on your own to prevent diseases and keep yourself healthy. What should I know about diet, weight, and exercise? Eat a healthy diet  Eat a diet that includes plenty of vegetables, fruits, low-fat dairy products, and lean protein. Do not eat a lot of foods that are high in solid fats, added sugars, or sodium. Maintain a healthy weight Body mass index (BMI) is a measurement that can be used to identify possible weight problems. It estimates body fat based on height and weight. Your health care provider can help determine your BMI and help you achieve or maintain a healthy weight. Get regular exercise Get regular exercise. This is one of the most important things you can do for your health. Most adults should: Exercise for at least 150 minutes each week. The exercise should increase your heart rate and make you sweat (moderate-intensity exercise). Do strengthening exercises at least twice a week. This is in addition to the moderate-intensity exercise. Spend less time sitting. Even light physical activity can be beneficial. Watch cholesterol and blood lipids Have your blood tested for lipids and cholesterol at 59 years of age, then have this test every 5 years. You may need to have your cholesterol levels checked more often if: Your lipid or cholesterol levels are high. You are older than 59 years of age. You are at high risk for heart disease. What should I know about cancer screening? Many types of cancers can be detected early and may often be prevented. Depending on your health history and family history, you may need to have cancer screening at various ages. This may include screening for: Colorectal cancer. Prostate cancer. Skin cancer. Lung  cancer. What should I know about heart disease, diabetes, and high blood pressure? Blood pressure and heart disease High blood pressure causes heart disease and increases the risk of stroke. This is more likely to develop in people who have high blood pressure readings or are overweight. Talk with your health care provider about your target blood pressure readings. Have your blood pressure checked: Every 3-5 years if you are 18-39 years of age. Every year if you are 40 years old or older. If you are between the ages of 65 and 75 and are a current or former smoker, ask your health care provider if you should have a one-time screening for abdominal aortic aneurysm (AAA). Diabetes Have regular diabetes screenings. This checks your fasting blood sugar level. Have the screening done: Once every three years after age 45 if you are at a normal weight and have a low risk for diabetes. More often and at a younger age if you are overweight or have a high risk for diabetes. What should I know about preventing infection? Hepatitis B If you have a higher risk for hepatitis B, you should be screened for this virus. Talk with your health care provider to find out if you are at risk for hepatitis B infection. Hepatitis C Blood testing is recommended for: Everyone born from 1945 through 1965. Anyone with known risk factors for hepatitis C. Sexually transmitted infections (STIs) You should be screened each year for STIs, including gonorrhea and chlamydia, if: You are sexually active and are younger than 59 years of age. You are older than 59 years of age and your   health care provider tells you that you are at risk for this type of infection. Your sexual activity has changed since you were last screened, and you are at increased risk for chlamydia or gonorrhea. Ask your health care provider if you are at risk. Ask your health care provider about whether you are at high risk for HIV. Your health care provider  may recommend a prescription medicine to help prevent HIV infection. If you choose to take medicine to prevent HIV, you should first get tested for HIV. You should then be tested every 3 months for as long as you are taking the medicine. Follow these instructions at home: Alcohol use Do not drink alcohol if your health care provider tells you not to drink. If you drink alcohol: Limit how much you have to 0-2 drinks a day. Know how much alcohol is in your drink. In the [place] one drink equals one 12 oz bottle of beer (355 mL), one 5 oz glass of wine (148 mL), or one 1 oz glass of hard liquor (44 mL). Lifestyle Do not use any products that contain nicotine or tobacco. These products include cigarettes, chewing tobacco, and vaping devices, such as e-cigarettes. If you need help quitting, ask your health care provider. Do not use street drugs. Do not share needles. Ask your health care provider for help if you need support or information about quitting drugs. General instructions Schedule regular health, dental, and eye exams. Stay current with your vaccines. Tell your health care provider if: You often feel depressed. You have ever been abused or do not feel safe at home. Summary Adopting a healthy lifestyle and getting preventive care are important in promoting health and wellness. Follow your health care provider's instructions about healthy diet, exercising, and getting tested or screened for diseases. Follow your health care provider's instructions on monitoring your cholesterol and blood pressure. This information is not intended to replace advice given to you by your health care provider. Make sure you discuss any questions you have with your health care provider. Document Revised: 04/01/2021 Document Reviewed: 04/01/2021 Elsevier Patient Education  2024 Elsevier Inc.  

## 2023-05-04 ENCOUNTER — Other Ambulatory Visit: Payer: Self-pay

## 2023-05-04 MED ORDER — OXYCODONE HCL 20 MG PO TABS: 20.0000 mg | ORAL_TABLET | Freq: Four times a day (QID) | ORAL | 0 refills | Status: DC | PRN

## 2023-05-05 ENCOUNTER — Other Ambulatory Visit: Payer: Self-pay | Admitting: Family Medicine

## 2023-05-05 ENCOUNTER — Other Ambulatory Visit: Payer: Self-pay

## 2023-05-05 ENCOUNTER — Telehealth: Payer: Self-pay | Admitting: *Deleted

## 2023-05-05 MED ORDER — LINACLOTIDE 72 MCG PO CAPS: ORAL_CAPSULE | ORAL | 0 refills | Status: DC

## 2023-05-05 NOTE — Patient Outreach (Signed)
  Care Coordination   Initial Visit Note   05/05/2023 Name: Kevin Pena MRN: [id number] DOB: 09-Sep-1966  Kevin Pena is a 57 y.o. year old male who sees Cox, Kirsten, MD for primary care. I spoke with  [name] by phone today.  What matters to the patients health and wellness today?  Pt is interested in participating in the Care Coordination program and intake scheduled for 05/07/23.    Goals Addressed   None     SDOH assessments and interventions completed:  No     Care Coordination Interventions:  No, not indicated   Follow up plan: Follow up call scheduled for 05/07/23    Encounter Outcome:  Pt. Visit Completed

## 2023-05-07 ENCOUNTER — Encounter: Payer: Self-pay | Admitting: *Deleted

## 2023-05-08 ENCOUNTER — Telehealth: Payer: Self-pay | Admitting: *Deleted

## 2023-05-08 NOTE — Patient Outreach (Signed)
  Care Coordination   05/08/2023 Name: Kevin Pena MRN: [id number] DOB: October 31, 1966   Care Coordination Outreach Attempts:  An unsuccessful telephone outreach was attempted today to offer the patient information about available care coordination services.  Follow Up Plan:  Additional outreach attempts will be made to offer the patient care coordination information and services.   Encounter Outcome:  No Answer   Care Coordination Interventions:  No, not indicated    Reece Levy, MSW, LCSW Clinical Social Worker [facility] [employer] [facility] [phone]

## 2023-05-26 ENCOUNTER — Ambulatory Visit (INDEPENDENT_AMBULATORY_CARE_PROVIDER_SITE_OTHER): Payer: Medicare HMO | Admitting: Family Medicine

## 2023-05-26 ENCOUNTER — Encounter: Payer: Self-pay | Admitting: Family Medicine

## 2023-05-26 VITALS — BP 160/80 | HR 65 | Temp 97.5°F | Ht 69.0 in | Wt 203.0 lb

## 2023-05-26 DIAGNOSIS — M25562 Pain in left knee: Secondary | ICD-10-CM | POA: Diagnosis not present

## 2023-05-26 DIAGNOSIS — F1120 Opioid dependence, uncomplicated: Secondary | ICD-10-CM | POA: Diagnosis not present

## 2023-05-26 DIAGNOSIS — E782 Mixed hyperlipidemia: Secondary | ICD-10-CM

## 2023-05-26 DIAGNOSIS — K5909 Other constipation: Secondary | ICD-10-CM

## 2023-05-26 DIAGNOSIS — K2100 Gastro-esophageal reflux disease with esophagitis, without bleeding: Secondary | ICD-10-CM | POA: Diagnosis not present

## 2023-05-26 DIAGNOSIS — M25561 Pain in right knee: Secondary | ICD-10-CM

## 2023-05-26 DIAGNOSIS — I10 Essential (primary) hypertension: Secondary | ICD-10-CM

## 2023-05-26 DIAGNOSIS — G90521 Complex regional pain syndrome I of right lower limb: Secondary | ICD-10-CM | POA: Diagnosis not present

## 2023-05-26 DIAGNOSIS — G8929 Other chronic pain: Secondary | ICD-10-CM

## 2023-05-26 DIAGNOSIS — L608 Other nail disorders: Secondary | ICD-10-CM | POA: Diagnosis not present

## 2023-05-26 DIAGNOSIS — Z21 Asymptomatic human immunodeficiency virus [HIV] infection status: Secondary | ICD-10-CM | POA: Diagnosis not present

## 2023-05-26 NOTE — Progress Notes (Unsigned)
Subjective:  Patient ID: Kevin Pena, male    DOB: [birth year]  Age: 57 y.o. MRN: [id number]  Chief Complaint  Patient presents with   Medical Management of Chronic Issues    HPI Patient is a 57 year old white male with GERD, hyperlipidemia, hypertension, chronic pain syndrome, chronic regional pain syndrome of right lower limb, chronic constipation, HIV and HSV who presents for chronic follow-up.   Chronic pain syndrome/regional pain syndrome: On meloxicam 15 mg daily, on oxycodone 20 mg 4 times daily as needed. Complex regional pain syndrome 1 of lower right leg.    Insomnia: Using CPAP  HIV: On Odefsey one daily.  Per the patient his T-cell numbers are nondetectible. Sees ID doctot.  Hypertension: On hydrochlorothiazide 25 mg nightly.   GERD: On Nexium 40 mg twice daily. If goes down to once daily, develops reflux. Well controlled at twice daily.    Constipation: On Linzess 72 mcg every morning.   Hyperlipidemia: On Lipitor 20 mg nightly.   Eats fairly healthy.      04/23/2023    2:48 PM 12/17/2022   10:35 AM 09/08/2022   11:00 AM 05/13/2022   10:36 AM 07/04/2021    9:00 AM  Depression screen PHQ 2/9  Decreased Interest 0 0 0 0 0  Down, Depressed, Hopeless 0 0 0 0 0  PHQ - 2 Score 0 0 0 0 0  Altered sleeping  0   0  Tired, decreased energy  0   0  Change in appetite  0   0  Feeling bad or failure about yourself   0   0  Trouble concentrating  0   0  Moving slowly or fidgety/restless  0   0  Suicidal thoughts  0   0  PHQ-9 Score  0   0  Difficult doing work/chores  Not difficult at all   Not difficult at all        04/23/2023    2:47 PM  Fall Risk   Falls in the past year? 0  Number falls in past yr: 0  Injury with Fall? 0  Risk for fall due to : No Fall Risks  Follow up Falls evaluation completed    Patient Care Team: Blane Ohara, MD as PCP - General (Internal Medicine) Issabella Rix, Janet Berlin, MD as Referring Physician (Infectious Diseases)   Review of  Systems  Constitutional:  Negative for chills, diaphoresis, fatigue and fever.  HENT:  Negative for congestion, ear pain and sore throat.   Respiratory:  Negative for cough and shortness of breath.   Cardiovascular:  Positive for leg swelling. Negative for chest pain.  Gastrointestinal:  Negative for abdominal pain, constipation, diarrhea, nausea and vomiting.  Genitourinary:  Negative for dysuria and urgency.  Musculoskeletal:  Positive for back pain. Negative for arthralgias (Right leg) and myalgias.  Neurological:  Negative for dizziness and headaches.  Psychiatric/Behavioral:  Negative for dysphoric mood.     Current Outpatient Medications on File Prior to Visit  Medication Sig Dispense Refill   albuterol (VENTOLIN HFA) 108 (90 Base) MCG/ACT inhaler TAKE 2 PUFFS BY MOUTH EVERY 6 HOURS AS NEEDED FOR WHEEZE OR SHORTNESS OF BREATH 17 each 3   atorvastatin (LIPITOR) 20 MG tablet TAKE 1 TABLET BY MOUTH EVERY DAY 90 tablet 0   esomeprazole (NEXIUM) 40 MG capsule TAKE 1 CAPSULE BY MOUTH TWICE A DAY 180 capsule 2   fluticasone (FLONASE) 50 MCG/ACT nasal spray SPRAY 1 SPRAY INTO EACH NOSTRIL EVERY DAY AS NEEDED  FOR ALLERGIES 48 mL 2   hydrochlorothiazide (HYDRODIURIL) 25 MG tablet Take 1 tablet (25 mg total) by mouth every evening. 90 tablet 3   linaclotide (LINZESS) 72 MCG capsule TAKE 1 CAPSULE BY MOUTH DAILY BEFORE BREAKFAST. 30 capsule 0   ODEFSEY 200-25-25 MG TABS tablet Take 1 tablet by mouth every morning.     Oxycodone HCl 20 MG TABS Take 1 tablet (20 mg total) by mouth 4 (four) times daily as needed. 120 tablet 0   promethazine (PHENERGAN) 25 MG tablet TAKE 1 TABLET BY MOUTH EVERY 8 HOURS AS NEEDED FOR NAUSEA OR VOMITING. 30 tablet 1   valACYclovir (VALTREX) 500 MG tablet Take 1 tablet by mouth daily as needed (outbreaks).      No current facility-administered medications on file prior to visit.   Past Medical History:  Diagnosis Date   Anogenital herpes simplex virus (HSV) infection  09/01/2014   Arthritis    GERD (gastroesophageal reflux disease)    HIV (human immunodeficiency virus infection) (HCC)    Hyperlipidemia    Internal hemorrhoids    Long-term current use of opiate analgesic 03/14/2020   OSA (obstructive sleep apnea) 09/25/2016   Presence of tooth-root and mandibular implants 05/29/2020   Rash left leg-- using topical cream   Spondylosis with myelopathy, 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   Past Surgical History:  Procedure Laterality Date   APPENDECTOMY  1981   CHOLECYSTECTOMY  2006   dental implants  11/2020   FOOT SURGERY     HAMMER TOE SURGERY  OCT 2010   INTERSTIM IMPLANT PLACEMENT  06/17/2012   Procedure: Leane Platt IMPLANT FIRST STAGE;  Surgeon: Martina Sinner, MD;  [facility];  Service: Urology;  Laterality: N/A;  RAD TECH OK PER VICKIE AT MAIN OR    INTERSTIM IMPLANT PLACEMENT  06/17/2012   Procedure: INTERSTIM IMPLANT SECOND STAGE;  Surgeon: Martina Sinner, MD;  Location: [facility];  Service: Urology;  Laterality: N/A;   MOUTH SURGERY  02/16/2020   REPAIR RECURRENT RIGHT INGUINAL HERNIA  2000   RIGHT INGUINAL HERNIA REPAIR  1997   UMBILICAL HERNIA REPAIR  2010    Family History  Problem Relation Age of Onset   Other Mother        colon resection, tumor near liver   Diabetes Mother    Hyperlipidemia Mother    Hypertension Mother    Varicose Veins Mother    Heart Problems Father    Diabetes Father    Heart disease Father    Hyperlipidemia Father    Hypertension Father    Pancreatic cancer Neg Hx    Rectal cancer Neg Hx    Stomach cancer Neg Hx    Colon cancer Neg Hx    Social History   Socioeconomic History   Marital status: Significant Other    Spouse name: [name]   Number of children: 1   Years of education: College   Highest education level: Not on file  Occupational History    Employer: OTHER    Comment: disability  Tobacco Use   Smoking status: Former    Packs/day:  1.00    Years: 30.00    Additional pack years: 0.00    Total pack years: 30.00    Types: Cigarettes    Quit date: 06/11/2010    Years since quitting: 12.9   Smokeless tobacco: Never  Vaping Use   Vaping Use: Some days  Substance and Sexual Activity   Alcohol use: Yes  Alcohol/week: 2.0 standard drinks of alcohol    Types: 2 Cans of beer per week    Comment: rarely   Drug use: No   Sexual activity: Not Currently    Partners: Male  Other Topics Concern   Not on file  Social History Narrative   Patient lives at home with partner.   Caffeine Use: 32oz daily coffee/tea      Disability.    Social Determinants of Health   Financial Resource Strain: Low Risk  (04/23/2023)   Overall Financial Resource Strain (CARDIA)    Difficulty of Paying Living Expenses: Not hard at all  Food Insecurity: No Food Insecurity (04/23/2023)   Hunger Vital Sign    Worried About Running Out of Food in the Last Year: Never true    Ran Out of Food in the Last Year: Never true  Transportation Needs: No Transportation Needs (04/23/2023)   PRAPARE - Administrator, Civil Service (Medical): No    Lack of Transportation (Non-Medical): No  Physical Activity: Sufficiently Active (04/23/2023)   Exercise Vital Sign    Days of Exercise per Week: 5 days    Minutes of Exercise per Session: 30 min  Stress: No Stress Concern Present (04/23/2023)   [employer] of Occupational Health - Occupational Stress Questionnaire    Feeling of Stress : Only a little  Social Connections: Moderately Isolated (05/26/2023)   Social Connection and Isolation Panel [NHANES]    Frequency of Communication with Friends and Family: Twice a week    Frequency of Social Gatherings with Friends and Family: Twice a week    Attends Religious Services: Never    Database administrator or Organizations: No    Attends Engineer, structural: Never    Marital Status: Living with partner    Objective:  BP (!) 160/80    Pulse 65   Temp (!) 97.5 F (36.4 C)   Ht 5\' 9"  (1.753 m)   Wt 203 lb (92.1 kg)   SpO2 98%   BMI 29.98 kg/m      05/26/2023    2:25 PM 05/26/2023    2:21 PM 05/26/2023    1:46 PM  BP/Weight  Systolic BP 160 178 190  Diastolic BP 80 82 92  Wt. (Lbs)   203  BMI   29.98 kg/m2    Physical Exam Vitals reviewed.  Constitutional:      Appearance: Normal appearance. He is normal weight.  Cardiovascular:     Rate and Rhythm: Normal rate and regular rhythm.     Heart sounds: No murmur heard. Pulmonary:     Effort: Pulmonary effort is normal.     Breath sounds: Normal breath sounds.  Abdominal:     General: Abdomen is flat. Bowel sounds are normal.     Palpations: Abdomen is soft.     Tenderness: There is abdominal tenderness (mild; generalized).  Musculoskeletal:     Right lower leg: Swelling present.     Left lower leg: Normal.  Skin:    Comments: Toe nails thickened.   Neurological:     Mental Status: He is alert and oriented to person, place, and time.  Psychiatric:        Mood and Affect: Mood normal.        Behavior: Behavior normal.     Diabetic Foot Exam - Simple   No data filed      Lab Results  Component Value Date   WBC 6.5 05/26/2023  HGB 14.4 05/26/2023   HCT 44.0 05/26/2023   PLT 186 05/26/2023   GLUCOSE 84 05/26/2023   CHOL 164 05/26/2023   TRIG 87 05/26/2023   HDL 56 05/26/2023   LDLCALC 92 05/26/2023   ALT 14 05/26/2023   AST 23 05/26/2023   NA 138 05/26/2023   K 4.7 05/26/2023   CL 103 05/26/2023   CREATININE 1.30 (H) 05/26/2023   BUN 21 05/26/2023   CO2 23 05/26/2023      Assessment & Plan:    Mixed hyperlipidemia Assessment & Plan: Well controlled.  No changes to medicines. Lipitor 20 mg nightly.  Continue to work on eating a healthy diet and exercise.  Labs drawn today.    Orders: -     Lipid panel  Asymptomatic HIV infection, with no history of HIV-related illness [facility]) Assessment & Plan: Follows with infectious disease.   The current medical regimen is effective;  continue present plan and medications.    Chronic constipation Assessment & Plan: The current medical regimen is effective;  continue present plan and medications. continue linzess 72 mcg once daily.     Complex regional pain syndrome type 1 of right lower extremity Assessment & Plan: Referring to podiatry  Orders: -     Ambulatory referral to Podiatry  Primary hypertension Assessment & Plan: Uncontrolled Continue taking hydrochlorothiazide 25 mg at bedtime Monitor bp at home and keep bp log. Labs drawn today  Orders: -     CBC with Differential/Platelet -     Comprehensive metabolic panel  Gastroesophageal reflux disease with esophagitis without hemorrhage Assessment & Plan: The current medical regimen is effective;  continue present plan and medications. Continue nexium 40 mg twice daily.    Chronic narcotic dependence (HCC) Assessment & Plan: Drug screen due to pain management. The current medical regimen is effective;  continue present plan and medications.   Orders: -     Pain Mgt Scrn (14 Drugs), Ur  Chronic pain of right knee Assessment & Plan: The current medical regimen is effective;  continue present plan and medications.  Meloxicam 15 mg daily and oxycodone 20 mg 4 times daily as needed.   Chronic pain of left knee Assessment & Plan: The current medical regimen is effective;  continue present plan and medications.  Meloxicam 15 mg daily and oxycodone 20 mg 4 times daily as needed.      No orders of the defined types were placed in this encounter.   Orders Placed This Encounter  Procedures   CBC with Differential/Platelet   Comprehensive metabolic panel   Lipid panel   Pain Mgt Scrn (14 Drugs), Ur   Ambulatory referral to Podiatry     Follow-up: Return in about 3 months (around 08/26/2023) for chronic fasting, Nurse visit FOR BP CHECK next week.Annia Friendly A Bramblett,acting as a scribe  for Blane Ohara, MD.,have documented all relevant documentation on the behalf of Blane Ohara, MD,as directed by  Blane Ohara, MD while in the presence of Blane Ohara, MD.   Clayborn Bigness I Leal-Borjas,acting as a scribe for Blane Ohara, MD.,have documented all relevant documentation on the behalf of Blane Ohara, MD,as directed by  Blane Ohara, MD while in the presence of Blane Ohara, MD.    An After Visit Summary was printed and given to the patient.  Blane Ohara, MD [facility] [phone]

## 2023-05-27 LAB — PAIN MGT SCRN (14 DRUGS), UR
Amphetamine Scrn, Ur: NEGATIVE ng/mL
BARBITURATE SCREEN URINE: NEGATIVE ng/mL
BENZODIAZEPINE SCREEN, URINE: NEGATIVE ng/mL
Buprenorphine, Urine: NEGATIVE ng/mL
CANNABINOIDS UR QL SCN: NEGATIVE ng/mL
Cocaine (Metab) Scrn, Ur: NEGATIVE ng/mL
Creatinine(Crt), U: 191.8 mg/dL (ref 20.0–300.0)
Fentanyl, Urine: NEGATIVE pg/mL
Meperidine Screen, Urine: NEGATIVE ng/mL
Methadone Screen, Urine: NEGATIVE ng/mL
OXYCODONE+OXYMORPHONE UR QL SCN: POSITIVE ng/mL — AB
Opiate Scrn, Ur: POSITIVE ng/mL — AB
Ph of Urine: 5.2 (ref 4.5–8.9)
Phencyclidine Qn, Ur: NEGATIVE ng/mL
Propoxyphene Scrn, Ur: NEGATIVE ng/mL
Tramadol Screen, Urine: NEGATIVE ng/mL

## 2023-05-27 LAB — COMPREHENSIVE METABOLIC PANEL
ALT: 14 IU/L (ref 0–44)
AST: 23 IU/L (ref 0–40)
Albumin: 4.3 g/dL (ref 3.8–4.9)
Alkaline Phosphatase: 88 IU/L (ref 44–121)
BUN/Creatinine Ratio: 16 (ref 9–20)
BUN: 21 mg/dL (ref 6–24)
Bilirubin Total: 0.5 mg/dL (ref 0.0–1.2)
CO2: 23 mmol/L (ref 20–29)
Calcium: 8.7 mg/dL (ref 8.7–10.2)
Chloride: 103 mmol/L (ref 96–106)
Creatinine, Ser: 1.3 mg/dL — ABNORMAL HIGH (ref 0.76–1.27)
Globulin, Total: 2.2 g/dL (ref 1.5–4.5)
Glucose: 84 mg/dL (ref 70–99)
Potassium: 4.7 mmol/L (ref 3.5–5.2)
Sodium: 138 mmol/L (ref 134–144)
Total Protein: 6.5 g/dL (ref 6.0–8.5)
eGFR: 63 mL/min/{1.73_m2} (ref >59)

## 2023-05-27 LAB — CBC WITH DIFFERENTIAL/PLATELET
Basophils Absolute: 0.1 10*3/uL (ref 0.0–0.2)
Basos: 1 %
EOS (ABSOLUTE): 0.1 10*3/uL (ref 0.0–0.4)
Eos: 1 %
Hematocrit: 44 % (ref 37.5–51.0)
Hemoglobin: 14.4 g/dL (ref 13.0–17.7)
Immature Grans (Abs): 0 10*3/uL (ref 0.0–0.1)
Immature Granulocytes: 0 %
Lymphocytes Absolute: 2 10*3/uL (ref 0.7–3.1)
Lymphs: 30 %
MCH: 31.5 pg (ref 26.6–33.0)
MCHC: 32.7 g/dL (ref 31.5–35.7)
MCV: 96 fL (ref 79–97)
Monocytes Absolute: 0.7 10*3/uL (ref 0.1–0.9)
Monocytes: 11 %
Neutrophils Absolute: 3.7 10*3/uL (ref 1.4–7.0)
Neutrophils: 57 %
Platelets: 186 10*3/uL (ref 150–450)
RBC: 4.57 x10E6/uL (ref 4.14–5.80)
RDW: 12.2 % (ref 11.6–15.4)
WBC: 6.5 10*3/uL (ref 3.4–10.8)

## 2023-05-27 LAB — LIPID PANEL
Chol/HDL Ratio: 2.9 ratio (ref 0.0–5.0)
Cholesterol, Total: 164 mg/dL (ref 100–199)
HDL: 56 mg/dL (ref >39)
LDL Chol Calc (NIH): 92 mg/dL (ref 0–99)
Triglycerides: 87 mg/dL (ref 0–149)
VLDL Cholesterol Cal: 16 mg/dL (ref 5–40)

## 2023-05-29 NOTE — Assessment & Plan Note (Signed)
Follows with infectious disease.  The current medical regimen is effective;  continue present plan and medications.  

## 2023-05-29 NOTE — Assessment & Plan Note (Signed)
The current medical regimen is effective;  continue present plan and medications.  Meloxicam 15 mg daily and oxycodone 20 mg 4 times daily as needed.

## 2023-05-29 NOTE — Assessment & Plan Note (Signed)
The current medical regimen is effective;  continue present plan and medications.continue linzess 72 mcg once daily.   

## 2023-05-29 NOTE — Assessment & Plan Note (Signed)
Referring to podiatry

## 2023-05-29 NOTE — Assessment & Plan Note (Signed)
Drug screen due to pain management. The current medical regimen is effective;  continue present plan and medications.  

## 2023-05-29 NOTE — Assessment & Plan Note (Signed)
The current medical regimen is effective;  continue present plan and medications. Continue nexium 40mg twice daily.  

## 2023-05-29 NOTE — Assessment & Plan Note (Signed)
The current medical regimen is effective;  continue present plan and medications.  Meloxicam 15 mg daily and oxycodone 20 mg 4 times daily as needed. 

## 2023-05-29 NOTE — Assessment & Plan Note (Signed)
Well controlled.  No changes to medicines. Lipitor 20 mg nightly Continue to work on eating a healthy diet and exercise.  Labs drawn today.  

## 2023-05-29 NOTE — Assessment & Plan Note (Signed)
Uncontrolled Continue taking hydrochlorothiazide 25 mg at bedtime Monitor bp at home and keep bp log. Labs drawn today

## 2023-05-31 DIAGNOSIS — L608 Other nail disorders: Secondary | ICD-10-CM

## 2023-05-31 HISTORY — DX: Other nail disorders: L60.8

## 2023-06-01 DIAGNOSIS — L03113 Cellulitis of right upper limb: Secondary | ICD-10-CM | POA: Diagnosis not present

## 2023-06-01 DIAGNOSIS — W5501XA Bitten by cat, initial encounter: Secondary | ICD-10-CM | POA: Diagnosis not present

## 2023-06-02 ENCOUNTER — Telehealth: Payer: Self-pay | Admitting: *Deleted

## 2023-06-02 NOTE — Progress Notes (Unsigned)
  Care Coordination Note  06/02/2023 Name: Kevin Pena MRN: [id number] DOB: 18-Jan-1966  Kevin Pena is a 57 y.o. year old male who is a primary care patient of Cox, Kirsten, MD and is actively engaged with the care management team. I reached out to [name] by phone today to assist with re-scheduling a follow up visit with the Licensed Clinical Social Worker  Follow up plan: Unsuccessful telephone outreach attempt made. A HIPAA compliant phone message was left for the patient providing contact information and requesting a return call.   [facility]  Care Coordination Care Guide  Direct Dial: [phone]

## 2023-06-03 ENCOUNTER — Ambulatory Visit: Payer: Medicare HMO

## 2023-06-03 VITALS — BP 110/72

## 2023-06-03 DIAGNOSIS — I10 Essential (primary) hypertension: Secondary | ICD-10-CM

## 2023-06-03 NOTE — Progress Notes (Signed)
  Care Coordination Note  06/03/2023 Name: Acel Natzke MRN: [id number] DOB: September 05, 1966  Kailyn Dubie is a 57 y.o. year old male who is a primary care patient of Cox, Kirsten, MD and is actively engaged with the care management team. I reached out to [name] by phone today to assist with re-scheduling a follow up visit with the Licensed Clinical Social Worker  Follow up plan: Telephone appointment with care management team member scheduled for:06/18/23  [facility]  Care Coordination Care Guide  Direct Dial: [phone]

## 2023-06-04 NOTE — Progress Notes (Signed)
Patient's BP was within normal range. Told the patient to follow up as scheduled and to call if he is having any symptoms of hypertension and or high bp readings at home. Patient understood verbally.

## 2023-06-05 ENCOUNTER — Other Ambulatory Visit: Payer: Self-pay

## 2023-06-05 MED ORDER — OXYCODONE HCL 20 MG PO TABS: 20.0000 mg | ORAL_TABLET | Freq: Four times a day (QID) | ORAL | 0 refills | Status: DC | PRN

## 2023-06-10 ENCOUNTER — Ambulatory Visit (INDEPENDENT_AMBULATORY_CARE_PROVIDER_SITE_OTHER): Payer: Medicare HMO | Admitting: Podiatry

## 2023-06-10 DIAGNOSIS — M7989 Other specified soft tissue disorders: Secondary | ICD-10-CM | POA: Diagnosis not present

## 2023-06-10 DIAGNOSIS — B49 Unspecified mycosis: Secondary | ICD-10-CM | POA: Diagnosis not present

## 2023-06-10 DIAGNOSIS — D849 Immunodeficiency, unspecified: Secondary | ICD-10-CM | POA: Diagnosis not present

## 2023-06-10 DIAGNOSIS — I872 Venous insufficiency (chronic) (peripheral): Secondary | ICD-10-CM | POA: Diagnosis not present

## 2023-06-10 DIAGNOSIS — B351 Tinea unguium: Secondary | ICD-10-CM | POA: Diagnosis not present

## 2023-06-10 DIAGNOSIS — L601 Onycholysis: Secondary | ICD-10-CM | POA: Diagnosis not present

## 2023-06-10 DIAGNOSIS — L603 Nail dystrophy: Secondary | ICD-10-CM | POA: Diagnosis not present

## 2023-06-10 NOTE — Progress Notes (Signed)
Chief Complaint  Patient presents with   Foot Pain    Pt states he has been having swelling for a long time and he also states bilateral great toe nails are lifting on the ends   HPI: 57 y.o. male presents on chronic antiviral medication today with concern of swelling in the right lower leg and foot.  States that this has been an ongoing issue for years but the patient feels it is progressively worsening over the past few months.  He has tried compression stockings in the past which only make him swell higher up on the thigh.  Also notes some hip pain as well.  Also has a secondary concern of possible fungal nails on bilateral great toenails.  He states that he first started noticing this soon after getting a COVID vaccination in the past.  He has not tried treating with anything about to this point  Past Medical History:  Diagnosis Date   Anogenital herpes simplex virus (HSV) infection 09/01/2014   Arthritis    GERD (gastroesophageal reflux disease)    HIV (human immunodeficiency virus infection) (HCC)    Hyperlipidemia    Internal hemorrhoids    Long-term current use of opiate analgesic 03/14/2020   OSA (obstructive sleep apnea) 09/25/2016   Presence of tooth-root and mandibular implants 05/29/2020   Rash left leg-- using topical cream   Spondylosis with myelopathy, 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    Past Surgical History:  Procedure Laterality Date   APPENDECTOMY  1981   CHOLECYSTECTOMY  2006   dental implants  11/2020   FOOT SURGERY     HAMMER TOE SURGERY  OCT 2010   INTERSTIM IMPLANT PLACEMENT  06/17/2012   Procedure: Leane Platt IMPLANT FIRST STAGE;  Surgeon: Martina Sinner, MD;  Location: [facility] [place];  Service: Urology;  Laterality: N/A;  RAD TECH OK PER VICKIE AT MAIN OR    INTERSTIM IMPLANT PLACEMENT  06/17/2012   Procedure: INTERSTIM IMPLANT SECOND STAGE;  Surgeon: Martina Sinner, MD;  Location[facility];  Service: Urology;  Laterality:  N/A;   MOUTH SURGERY  02/16/2020   REPAIR RECURRENT RIGHT INGUINAL HERNIA  2000   RIGHT INGUINAL HERNIA REPAIR  1997   UMBILICAL HERNIA REPAIR  2010    Allergies  Allergen Reactions   Adhesive [Tape] Other (See Comments)    SEVERE IRRITAION   Ampicillin Nausea And Vomiting   Doxycycline Rash   Hydroxyzine Other (See Comments)    Nausea and itching   Lyrica [Pregabalin] Nausea And Vomiting and Rash     Physical Exam: There were no vitals filed for this visit.  General: The patient is alert and oriented x3 in no acute distress.  Dermatology: Skin is warm, dry and supple bilateral lower extremities. Interspaces are clear of maceration and debris.  Bilateral hallux nails have mild distal onycholysis along the distal medial portion of the nail with slight yellow discoloration noted.  There is pain with compression.  Vascular: Palpable pedal pulses bilaterally.  There is +1 pitting edema on the right leg and ankle.  No significant pain with posterior calf compression.  Neurological: Light touch sensation grossly intact bilateral feet.   Assessment/Plan of Care: 1. Fungus infection   2. Swelling of limb, right   3. Dermatophytosis of nail   4. Immunocompromised [facility])     Discussed clinical findings with patient today.  Will refer patient for venous studies to evaluate for venous insufficiency.  Since this is unilateral, cannot completely rule  out a superficial blockage in vein(s), but since this is chronic in nature, it is less likely.  The hallux toenails were trimmed and the clippings were sent to [facility] laboratories for fungal culture and sensitivity.  Will call the patient to review results and review treatment plan based on the report.  When reviewing his recent lab work it does not look like there has been a recent hepatic function panel.  He may need to be sent for blood work if oral medication is an option based on the fungal culture results.   Clerance Lav, DPM,  FACFAS [facility]     [address] [place], [place] [address]                Office [phone]  Fax [phone]

## 2023-06-15 ENCOUNTER — Encounter: Payer: Self-pay | Admitting: Podiatry

## 2023-06-15 ENCOUNTER — Ambulatory Visit (HOSPITAL_COMMUNITY)
Admission: RE | Admit: 2023-06-15 | Discharge: 2023-06-15 | Disposition: A | Discharge status: Home / Self Care | Payer: Medicare HMO | Source: Ambulatory Visit | Attending: Podiatry | Admitting: Podiatry

## 2023-06-15 DIAGNOSIS — M7989 Other specified soft tissue disorders: Secondary | ICD-10-CM | POA: Insufficient documentation

## 2023-06-15 NOTE — Addendum Note (Signed)
Addended byLucia Estelle D on: 06/15/2023 05:12 PM   Modules accepted: Orders

## 2023-06-18 ENCOUNTER — Ambulatory Visit: Payer: Self-pay | Admitting: *Deleted

## 2023-06-18 ENCOUNTER — Encounter: Payer: Self-pay | Admitting: Podiatry

## 2023-06-18 NOTE — Progress Notes (Deleted)
[facility]           [facility] [place]  History and Physical   Kevin Pena is a 57 y.o. male who presents with ***  ***  He has hx of regional pain syndrome on Meloxicam and Oxycodone and complex regional pain syndrome one of right leg.  He also has hx of HIV, HTN, GERD, HLD.    The pt does *** have hx of previous venous procedures. The patient has *** history of DVT. Pt does *** history of varicose vein.   Pt does *** history of skin changes in lower legs.   There is *** family history of venous disorders.   The patient has *** used compression stockings in the past.    The pt is on a statin for cholesterol management.  The pt is not on a daily aspirin.   Other AC:  none The pt is on diuretic for hypertension.   The pt is not on medication for diabetes.   Tobacco hx:  former  Pt does *** have family hx of AAA.  Past Medical History:  Diagnosis Date   Anogenital herpes simplex virus (HSV) infection 09/01/2014   Arthritis    GERD (gastroesophageal reflux disease)    HIV (human immunodeficiency virus infection) (HCC)    Hyperlipidemia    Internal hemorrhoids    Long-term current use of opiate analgesic 03/14/2020   OSA (obstructive sleep apnea) 09/25/2016   Presence of tooth-root and mandibular implants 05/29/2020   Rash left leg-- using topical cream   Spondylosis with myelopathy, 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    Past Surgical History:  Procedure Laterality Date   APPENDECTOMY  1981   CHOLECYSTECTOMY  2006   dental implants  11/2020   FOOT SURGERY     HAMMER TOE SURGERY  OCT 2010   INTERSTIM IMPLANT PLACEMENT  06/17/2012   Procedure: Leane Platt IMPLANT FIRST STAGE;  Surgeon: Martina Sinner, MD;  Location: [facility];  Service: Urology;  Laterality: N/A;  RAD TECH OK PER VICKIE AT MAIN OR    INTERSTIM IMPLANT PLACEMENT  06/17/2012   Procedure: INTERSTIM IMPLANT SECOND STAGE;  Surgeon: Martina Sinner, MD;  Location:  [facility];  Service: Urology;  Laterality: N/A;   MOUTH SURGERY  02/16/2020   REPAIR RECURRENT RIGHT INGUINAL HERNIA  2000   RIGHT INGUINAL HERNIA REPAIR  1997   UMBILICAL HERNIA REPAIR  2010    Social History   Socioeconomic History   Marital status: Significant Other    Spouse name: [name]   Number of children: 1   Years of education: College   Highest education level: Not on file  Occupational History    Employer: OTHER    Comment: disability  Tobacco Use   Smoking status: Former    Current packs/day: 0.00    Average packs/day: 1 pack/day for 30.0 years (30.0 ttl pk-yrs)    Types: Cigarettes    Start date: 06/11/1980    Quit date: 06/11/2010    Years since quitting: 13.0   Smokeless tobacco: Never  Vaping Use   Vaping status: Some Days  Substance and Sexual Activity   Alcohol use: Yes    Alcohol/week: 2.0 standard drinks of alcohol    Types: 2 Cans of beer per week    Comment: rarely   Drug use: No   Sexual activity: Not Currently    Partners: Male  Other Topics Concern  Not on file  Social History Narrative   Patient lives at home with partner.   Caffeine Use: 32oz daily coffee/tea      Disability.    Social Determinants of Health   Financial Resource Strain: Low Risk  (04/23/2023)   Overall Financial Resource Strain (CARDIA)    Difficulty of Paying Living Expenses: Not hard at all  Food Insecurity: No Food Insecurity (04/23/2023)   Hunger Vital Sign    Worried About Running Out of Food in the Last Year: Never true    Ran Out of Food in the Last Year: Never true  Transportation Needs: No Transportation Needs (04/23/2023)   PRAPARE - Administrator, Civil Service (Medical): No    Lack of Transportation (Non-Medical): No  Physical Activity: Sufficiently Active (04/23/2023)   Exercise Vital Sign    Days of Exercise per Week: 5 days    Minutes of Exercise per Session: 30 min  Stress: No Stress Concern Present (04/23/2023)    [employer] of Occupational Health - Occupational Stress Questionnaire    Feeling of Stress : Only a little  Social Connections: Moderately Isolated (05/26/2023)   Social Connection and Isolation Panel [NHANES]    Frequency of Communication with Friends and Family: Twice a week    Frequency of Social Gatherings with Friends and Family: Twice a week    Attends Religious Services: Never    Database administrator or Organizations: No    Attends Banker Meetings: Never    Marital Status: Living with partner  Intimate Partner Violence: Not At Risk (04/23/2023)   Humiliation, Afraid, Rape, and Kick questionnaire    Fear of Current or Ex-Partner: No    Emotionally Abused: No    Physically Abused: No    Sexually Abused: No    *** Family History  Problem Relation Age of Onset   Other Mother        colon resection, tumor near liver   Diabetes Mother    Hyperlipidemia Mother    Hypertension Mother    Varicose Veins Mother    Heart Problems Father    Diabetes Father    Heart disease Father    Hyperlipidemia Father    Hypertension Father    Pancreatic cancer Neg Hx    Rectal cancer Neg Hx    Stomach cancer Neg Hx    Colon cancer Neg Hx     Current Outpatient Medications  Medication Sig Dispense Refill   albuterol (VENTOLIN HFA) 108 (90 Base) MCG/ACT inhaler TAKE 2 PUFFS BY MOUTH EVERY 6 HOURS AS NEEDED FOR WHEEZE OR SHORTNESS OF BREATH 17 each 3   atorvastatin (LIPITOR) 20 MG tablet TAKE 1 TABLET BY MOUTH EVERY DAY 90 tablet 0   esomeprazole (NEXIUM) 40 MG capsule TAKE 1 CAPSULE BY MOUTH TWICE A DAY 180 capsule 2   fluticasone (FLONASE) 50 MCG/ACT nasal spray SPRAY 1 SPRAY INTO EACH NOSTRIL EVERY DAY AS NEEDED FOR ALLERGIES 48 mL 2   hydrochlorothiazide (HYDRODIURIL) 25 MG tablet Take 1 tablet (25 mg total) by mouth every evening. 90 tablet 3   linaclotide (LINZESS) 72 MCG capsule TAKE 1 CAPSULE BY MOUTH DAILY BEFORE BREAKFAST. 30 capsule 0   ODEFSEY 200-25-25 MG TABS  tablet Take 1 tablet by mouth every morning.     Oxycodone HCl 20 MG TABS Take 1 tablet (20 mg total) by mouth 4 (four) times daily as needed. 120 tablet 0   promethazine (PHENERGAN) 25 MG tablet TAKE 1 TABLET  BY MOUTH EVERY 8 HOURS AS NEEDED FOR NAUSEA OR VOMITING. 30 tablet 1   valACYclovir (VALTREX) 500 MG tablet Take 1 tablet by mouth daily as needed (outbreaks).      No current facility-administered medications for this visit.    Allergies  Allergen Reactions   Adhesive [Tape] Other (See Comments)    SEVERE IRRITAION   Ampicillin Nausea And Vomiting   Doxycycline Rash   Hydroxyzine Other (See Comments)    Nausea and itching   Lyrica [Pregabalin] Nausea And Vomiting and Rash    REVIEW OF SYSTEMS:  *** [X]  denotes positive finding, [ ]  denotes negative finding Cardiac  Comments:  Chest pain or chest pressure:    Shortness of breath upon exertion:    Short of breath when lying flat:    Irregular heart rhythm:        Vascular    Pain in calf, thigh, or hip brought on by ambulation:    Pain in feet at night that wakes you up from your sleep:     Blood clot in your veins:    Leg swelling:  x       Pulmonary    Oxygen at home:    Productive cough:     Wheezing:         Neurologic    Sudden weakness in arms or legs:     Sudden numbness in arms or legs:     Sudden onset of difficulty speaking or slurred speech:    Temporary loss of vision in one eye:     Problems with dizziness:         Gastrointestinal    Blood in stool:     Vomited blood:         Genitourinary    Burning when urinating:     Blood in urine:        Psychiatric    Major depression:         Hematologic    Bleeding problems:    Problems with blood clotting too easily:        Skin    Rashes or ulcers:        Constitutional    Fever or chills:      PHYSICAL EXAMINATION:  ***  General:  WDWN in NAD; vital signs documented above Gait: Not observed HENT: WNL, normocephalic Pulmonary:  normal non-labored breathing without wheezing Cardiac: {Desc; regular/irreg:14544} HR; {With/Without:20273} carotid bruit*** Abdomen: soft, NT, aortic pulse is *** palpable Skin: {With/Without:20273} rashes Vascular Exam/Pulses:  Right Left  Radial {Exam; arterial pulse strength 0-4:30167} {Exam; arterial pulse strength 0-4:30167}  DP {Exam; arterial pulse strength 0-4:30167} {Exam; arterial pulse strength 0-4:30167}  PT {Exam; arterial pulse strength 0-4:30167} {Exam; arterial pulse strength 0-4:30167}   Extremities: ***  Neurologic: A&O X 3;  moving all extremities equally Psychiatric:  The pt has {Desc; normal/abnormal:11317::"Normal"} affect.   Non-Invasive Vascular Imaging:   Venous duplex on 06/15/2023: +--------------+---------+------+-----------+------------+-----------------  ----+  RIGHT        Reflux NoRefluxReflux TimeDiameter cmsComments                                 Yes                                           +--------------+---------+------+-----------+------------+-----------------  CFV  no                                     increased pulsatility noted in vein              +--------------+---------+------+-----------+------------+-----------------  FV prox       no                                     increased pulsatility noted             +--------------+---------+------+-----------+------------+-----------------  FV mid        no                                                      +--------------+---------+------+-----------+------------+-----------------  FV dist       no                                                      +--------------+---------+------+-----------+------------+-----------------  Popliteal    no                                                      +--------------+---------+------+-----------+------------+-----------------  GSV at [facility]                                  .57                        +--------------+---------+------+-----------+------------+-----------------  GSV prox thigh                              .33                       +--------------+---------+------+-----------+------------+-----------------  GSV mid thigh no                            .28                       +--------------+---------+------+-----------+------------+-----------------  GSV dist thighno                            .28      distal thigh perforator noted without reflux seen    +--------------+---------+------+-----------+------------+-----------------  GSV at knee             yes    >500 ms      .27                       +--------------+---------+------+-----------+------------+-----------------  GSV prox calf           yes    >500  ms      .27     perforator noted  With reflux seen > 500 ms; .14 cm in diameter     +--------------+---------+------+-----------+------------+-----------------  GSV mid calf  no                            .18                       +--------------+---------+------+-----------+------------+-----------------  SSV Pop Fossa no                            .21                       +--------------+---------+------+-----------+------------+-----------------   Summary:  Right:  - No evidence of deep vein thrombosis seen in the right lower extremity,  from the common femoral through the popliteal veins.  - Venous reflux is noted in the right greater saphenous vein in the calf.  - Venous reflux is noted in the right perforator vein.   Increased pulsatility noted in the right CFV and proximal SFV, this could  be an indication of right-sided heart failure     Kevin Pena is a 57 y.o. male who presents with: ***    -pt has *** pedal pulses -pt does not have evidence of DVT.  Pt does not have venous reflux in the right deep venous system.  He does have reflux in the GSV at the knee and proximal calf.  He is not a  candidate for laser ablation as he does not have reflux at the [facility] and the vein is not of adequate size.  -discussed with pt about wearing *** high 15-20 mmHg compression stockings and pt was measured for these today.   *** -discussed the importance of leg elevation and how to elevate properly - pt is advised to elevate their legs and a diagram is given to them to demonstrate for pt to lay flat on their back with knees elevated and slightly bent with their feet higher than their knees, which puts their feet higher than their heart for 15 minutes per day.  If pt cannot lay flat, advised to lay as flat as possible.  -pt is advised to continue as much walking as possible and avoid sitting or standing for long periods of time.  -discussed importance of weight loss and exercise and that water aerobics would also be beneficial.  -handout with recommendations given -pt will f/u ***   Doreatha Massed, [facility] [phone]  Clinic MD:  Karin Lieu

## 2023-06-18 NOTE — Patient Instructions (Signed)
Visit Information  Thank you for taking time to visit with me today. Please don't hesitate to contact me if I can be of assistance to you.   Following are the goals we discussed today:   Goals Addressed             This Visit's Progress    Reduce anxiety and complete Advance Directives         Mental Health:  (Status: New goal.) Evaluation of current treatment plan related to Anxiety with Excessive Worry, New health diagnosis has worsened his anxiety Depression screen reviewed  PHQ2/ PHQ9 completed Solution-Focused Strategies employed:  Mindfulness or Relaxation training provided Active listening / Reflection utilized  Problem Solving /Task Center strategies reviewed Provided general psycho-education for mental health needs  Discussed possible RX options (OTC and RX) to aide in sleep ,          Our next appointment is by telephone on 07/10/23  Please call the care guide team at [phone] if you need to cancel or reschedule your appointment.   If you are experiencing a Mental Health or Behavioral Health Crisis or need someone to talk to, please call the Suicide and Crisis Lifeline: 988 call 911   The patient verbalized understanding of instructions, educational materials, and care plan provided today and DECLINED offer to receive copy of patient instructions, educational materials, and care plan.   Telephone follow up appointment with care management team member scheduled for: 07/09/23  Reece Levy, MSW, LCSW Clinical Social Worker [employer] [employer] [phone]

## 2023-06-18 NOTE — Patient Outreach (Signed)
  Care Coordination   Initial Visit Note   06/18/2023 Name: Euriah Matlack MRN: [id number] DOB: 06/09/1966  Jeramie Scogin is a 57 y.o. year old male who sees Cox, Kirsten, MD for primary care. I spoke with  [name] by phone today.  What matters to the patients health and wellness today?  Worried about new diagnosis of "right heart failure".    Goals Addressed             This Visit's Progress    Reduce anxiety and complete Advance Directives         Mental Health:  (Status: New goal.) Evaluation of current treatment plan related to Anxiety with Excessive Worry, New health diagnosis has worsened his anxiety Depression screen reviewed  PHQ2/ PHQ9 completed Solution-Focused Strategies employed:  Mindfulness or Relaxation training provided Active listening / Reflection utilized  Problem Solving /Task Center strategies reviewed Provided general psycho-education for mental health needs  Discussed possible RX options (OTC and RX) to aide in sleep ,          SDOH assessments and interventions completed:  Yes  SDOH Interventions Today    Flowsheet Row Most Recent Value  SDOH Interventions   Food Insecurity Interventions Intervention Not Indicated  Housing Interventions Intervention Not Indicated  Transportation Interventions Intervention Not Indicated  Utilities Interventions Intervention Not Indicated  Depression Interventions/Treatment  --  [pt plans to discuss anxiety, sleep issues with PCP]  Financial Strain Interventions Intervention Not Indicated  Stress Interventions Intervention Not Indicated        Care Coordination Interventions:  Yes, provided  Interventions Today    Flowsheet Row Most Recent Value  Chronic Disease   Chronic disease during today's visit Other  [anxiety, 042, new "heart failure" diagnosis]  General Interventions   General Interventions Discussed/Reviewed General Interventions Discussed, General Interventions  Reviewed, Community Resources, Referral to Nurse  [Pt would  like to have RNCM for Care Coordination support- multiple chronic conditions with new HF diagnosis]  Education Interventions   Education Provided Provided Education  Provided Verbal Education On Mental Health/Coping with Illness  Mental Health Interventions   Mental Health Discussed/Reviewed Mental Health Discussed, Coping Strategies, Anxiety, Mental Health Reviewed  [Pt acknowledges anxiety- worsened w/ recent medical changes and diagnosed with Heart Failure. Also reports trouble sleeping at times due to worry. CSW discussed treatment options for this and he wants to discuss with PCP (has considered trying Melatonin)]  Nutrition Interventions   Nutrition Discussed/Reviewed Nutrition Discussed  [Pt reports he tries to eat healthy and is open to further guidance/suggestions- RNCM to follow up]  Safety Interventions   Safety Discussed/Reviewed Safety Discussed  Advanced Directive Interventions   Advanced Directives Discussed/Reviewed Advanced Directives Discussed, Provided resource for acquiring and filling out documents  [Pt would like to complete- CSW will mail pt the HCPOA/Living Will packet to complete/sign in front of Notary]       Follow up plan: Follow up call scheduled for 07/09/23    Encounter Outcome:  Pt. Visit Completed

## 2023-06-20 ENCOUNTER — Other Ambulatory Visit: Payer: Self-pay | Admitting: Family Medicine

## 2023-06-22 ENCOUNTER — Ambulatory Visit (INDEPENDENT_AMBULATORY_CARE_PROVIDER_SITE_OTHER): Payer: Medicare HMO | Admitting: Physician Assistant

## 2023-06-22 VITALS — BP 129/81 | HR 60 | Temp 98.1°F | Resp 20 | Ht 69.0 in | Wt 198.0 lb

## 2023-06-22 DIAGNOSIS — M7989 Other specified soft tissue disorders: Secondary | ICD-10-CM | POA: Diagnosis not present

## 2023-06-22 NOTE — Progress Notes (Signed)
VASCULAR & VEIN SPECIALISTS           OF [place]  History and Physical   Redding Hay is a 57 y.o. male who presents with right leg swelling.  He states that he has tried knee high compression in the past but the leg above the compression swelled more.  He does elevate his leg when he is not up and about and this does help some.  He does not have hx of DVT.  He does not have skin color changes.  He does have some superficial veins present on the right leg.  He has minimal swelling on the left leg.  He has never had any venous procedures on his legs. He states he sometimes has a sensation of something crawling inside his right leg.   He states he does sleep on two pillows at night and does get shortness of breath sometimes at night.  He has not ever been evaluated by a cardiologist.   He has several family members who have needed PPM and his grandmother died at age 27 from heart issues.   He had been seen by Dr. Arbie Cookey in 2016 for right leg discomfort.  He did not have any evidence of venous or arterial disease at that time.   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  He also has hx of HIV, HTN, GERD, HLD.     The pt is on a statin for cholesterol management.  The pt is not on a daily aspirin.   Other AC:  none The pt is on diuretic for hypertension.   The pt is not on medication for diabetes.   Tobacco hx:  former  Pt is unsure if his mother has hx of AAA.    Past Medical History:  Diagnosis Date   Anogenital herpes simplex virus (HSV) infection 09/01/2014   Arthritis    GERD (gastroesophageal reflux disease)    HIV (human immunodeficiency virus infection) (HCC)    Hyperlipidemia    Internal hemorrhoids    Long-term current use of opiate analgesic 03/14/2020   OSA (obstructive sleep apnea) 09/25/2016   Presence of tooth-root and mandibular implants 05/29/2020   Rash left leg-- using  topical cream   Spondylosis with myelopathy, 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    Past Surgical History:  Procedure Laterality Date   APPENDECTOMY  1981   CHOLECYSTECTOMY  2006   dental implants  11/2020   FOOT SURGERY     HAMMER TOE SURGERY  OCT 2010   INTERSTIM IMPLANT PLACEMENT  06/17/2012   Procedure: Leane Platt IMPLANT FIRST STAGE;  Surgeon: Martina Sinner, MD;  Location: [facility];  Service: Urology;  Laterality: N/A;  RAD TECH OK PER VICKIE AT MAIN OR    INTERSTIM IMPLANT PLACEMENT  06/17/2012   Procedure: INTERSTIM IMPLANT SECOND STAGE;  Surgeon: Martina Sinner, MD;  Location[facility];  Service: Urology;  Laterality: N/A;   MOUTH SURGERY  02/16/2020   REPAIR RECURRENT RIGHT INGUINAL HERNIA  2000   RIGHT INGUINAL HERNIA REPAIR  1997   UMBILICAL HERNIA REPAIR  2010    Social History   Socioeconomic History   Marital status: Significant Other    Spouse name: [name]   Number of children: 1   Years of education: College   Highest education level: Not on file  Occupational History  Employer: OTHER    Comment: disability  Tobacco Use   Smoking status: Former    Current packs/day: 0.00    Average packs/day: 1 pack/day for 30.0 years (30.0 ttl pk-yrs)    Types: Cigarettes    Start date: 06/11/1980    Quit date: 06/11/2010    Years since quitting: 13.0   Smokeless tobacco: Never  Vaping Use   Vaping status: Some Days  Substance and Sexual Activity   Alcohol use: Yes    Alcohol/week: 2.0 standard drinks of alcohol    Types: 2 Cans of beer per week    Comment: rarely   Drug use: No   Sexual activity: Not Currently    Partners: Male  Other Topics Concern   Not on file  Social History Narrative   Patient lives at home with partner.   Caffeine Use: 32oz daily coffee/tea      Disability.    Social Determinants of Health   Financial Resource Strain: Low Risk  (06/18/2023)   Overall Financial Resource Strain (CARDIA)     Difficulty of Paying Living Expenses: Not hard at all  Food Insecurity: No Food Insecurity (06/18/2023)   Hunger Vital Sign    Worried About Running Out of Food in the Last Year: Never true    Ran Out of Food in the Last Year: Never true  Transportation Needs: No Transportation Needs (06/18/2023)   PRAPARE - Administrator, Civil Service (Medical): No    Lack of Transportation (Non-Medical): No  Physical Activity: Sufficiently Active (04/23/2023)   Exercise Vital Sign    Days of Exercise per Week: 5 days    Minutes of Exercise per Session: 30 min  Stress: No Stress Concern Present (06/18/2023)   [employer] of Occupational Health - Occupational Stress Questionnaire    Feeling of Stress : Only a little  Social Connections: Moderately Isolated (05/26/2023)   Social Connection and Isolation Panel [NHANES]    Frequency of Communication with Friends and Family: Twice a week    Frequency of Social Gatherings with Friends and Family: Twice a week    Attends Religious Services: Never    Database administrator or Organizations: No    Attends Banker Meetings: Never    Marital Status: Living with partner  Intimate Partner Violence: Not At Risk (04/23/2023)   Humiliation, Afraid, Rape, and Kick questionnaire    Fear of Current or Ex-Partner: No    Emotionally Abused: No    Physically Abused: No    Sexually Abused: No   Family History  Problem Relation Age of Onset   Other Mother        colon resection, tumor near liver   Diabetes Mother    Hyperlipidemia Mother    Hypertension Mother    Varicose Veins Mother    Heart Problems Father    Diabetes Father    Heart disease Father    Hyperlipidemia Father    Hypertension Father    Pancreatic cancer Neg Hx    Rectal cancer Neg Hx    Stomach cancer Neg Hx    Colon cancer Neg Hx     Current Outpatient Medications  Medication Sig Dispense Refill   albuterol (VENTOLIN HFA) 108 (90 Base) MCG/ACT inhaler TAKE 2  PUFFS BY MOUTH EVERY 6 HOURS AS NEEDED FOR WHEEZE OR SHORTNESS OF BREATH 17 each 3   atorvastatin (LIPITOR) 20 MG tablet TAKE 1 TABLET BY MOUTH EVERY DAY 90 tablet 0   esomeprazole (  NEXIUM) 40 MG capsule TAKE 1 CAPSULE BY MOUTH TWICE A DAY 180 capsule 2   fluticasone (FLONASE) 50 MCG/ACT nasal spray SPRAY 1 SPRAY INTO EACH NOSTRIL EVERY DAY AS NEEDED FOR ALLERGIES 48 mL 2   hydrochlorothiazide (HYDRODIURIL) 25 MG tablet Take 1 tablet (25 mg total) by mouth every evening. 90 tablet 3   linaclotide (LINZESS) 72 MCG capsule TAKE 1 CAPSULE BY MOUTH DAILY BEFORE BREAKFAST. 30 capsule 0   ODEFSEY 200-25-25 MG TABS tablet Take 1 tablet by mouth every morning.     promethazine (PHENERGAN) 25 MG tablet TAKE 1 TABLET BY MOUTH EVERY 8 HOURS AS NEEDED FOR NAUSEA OR VOMITING. 30 tablet 1   valACYclovir (VALTREX) 500 MG tablet Take 1 tablet by mouth daily as needed (outbreaks).      Oxycodone HCl 20 MG TABS Take 1 tablet (20 mg total) by mouth 4 (four) times daily as needed. (Patient not taking: Reported on 06/22/2023) 120 tablet 0   No current facility-administered medications for this visit.    Allergies  Allergen Reactions   Adhesive [Tape] Other (See Comments)    SEVERE IRRITAION   Ampicillin Nausea And Vomiting   Doxycycline Rash   Hydroxyzine Other (See Comments)    Nausea and itching   Lyrica [Pregabalin] Nausea And Vomiting and Rash    REVIEW OF SYSTEMS:   [X]  denotes positive finding, [ ]  denotes negative finding Cardiac  Comments:  Chest pain or chest pressure:    Shortness of breath upon exertion:    Short of breath when lying flat:    Irregular heart rhythm:        Vascular    Pain in calf, thigh, or hip brought on by ambulation:    Pain in feet at night that wakes you up from your sleep:     Blood clot in your veins:    Leg swelling:  x       Pulmonary    Oxygen at home:    Productive cough:     Wheezing:         Neurologic    Sudden weakness in arms or legs:     Sudden  numbness in arms or legs:     Sudden onset of difficulty speaking or slurred speech:    Temporary loss of vision in one eye:     Problems with dizziness:         Gastrointestinal    Blood in stool:     Vomited blood:         Genitourinary    Burning when urinating:     Blood in urine:        Psychiatric    Major depression:         Hematologic    Bleeding problems:    Problems with blood clotting too easily:        Skin    Rashes or ulcers:        Constitutional    Fever or chills:      PHYSICAL EXAMINATION:  Today's Vitals   06/22/23 1232  BP: 129/81  Pulse: 60  Resp: 20  Temp: 98.1 F (36.7 C)  TempSrc: Temporal  SpO2: 98%  Weight: 198 lb (89.8 kg)  Height: 5\' 9"  (1.753 m)   Body mass index is 29.24 kg/m.   General:  WDWN in NAD; vital signs documented above Gait: Not observed HENT: WNL, normocephalic Pulmonary: normal non-labored breathing without wheezing Cardiac: regular HR; without carotid bruits Abdomen: soft, NT,  aortic pulse is not palpable Skin: without rashes Vascular Exam/Pulses:  Right Left  Radial 2+ (normal) 2+ (normal)  DP 2+ (normal) 2+ (normal)  PT 2+ (normal) 2+ (normal)   Extremities: RLE swelling with some spider veins present.   Neurologic: A&O X 3;  moving all extremities equally Psychiatric:  The pt has Normal affect.   Non-Invasive Vascular Imaging:   Venous duplex on 06/15/2023: +--------------+---------+------+-----------+------------+-----------------  ----+  RIGHT        Reflux NoRefluxReflux TimeDiameter cmsComments                                 Yes                                           +--------------+---------+------+-----------+------------+-----------------  CFV          no                                     increased pulsatility noted in vein              +--------------+---------+------+-----------+------------+-----------------  FV prox       no                                      increased pulsatility noted             +--------------+---------+------+-----------+------------+-----------------  FV mid        no                                                      +--------------+---------+------+-----------+------------+-----------------  FV dist       no                                                      +--------------+---------+------+-----------+------------+-----------------  Popliteal    no                                                      +--------------+---------+------+-----------+------------+-----------------  GSV at [facility]                                  .57                       +--------------+---------+------+-----------+------------+-----------------  GSV prox thigh                              .33                       +--------------+---------+------+-----------+------------+-----------------  GSV mid thigh no                            .28                       +--------------+---------+------+-----------+------------+-----------------  GSV dist thighno                            .28      distal thigh perforator noted without reflux seen    +--------------+---------+------+-----------+------------+-----------------  GSV at knee             yes    >500 ms      .27                       +--------------+---------+------+-----------+------------+-----------------  GSV prox calf           yes    >500 ms      .27     perforator noted  With reflux seen > 500 ms; .14 cm in diameter     +--------------+---------+------+-----------+------------+-----------------  GSV mid calf  no                            .18                       +--------------+---------+------+-----------+------------+-----------------  SSV Pop Fossa no                            .21                       +--------------+---------+------+-----------+------------+-----------------   Summary:  Right:  - No  evidence of deep vein thrombosis seen in the right lower extremity,  from the common femoral through the popliteal veins.  - Venous reflux is noted in the right greater saphenous vein in the calf.  - Venous reflux is noted in the right perforator vein.   Increased pulsatility noted in the right CFV and proximal SFV, this could  be an indication of right-sided heart failure     Nore Kanode is a 57 y.o. male who presents with: RLE swelling    -pt has easily palpable pedal pulses bilaterally -pt does not have evidence of DVT.  Pt does not have venous reflux in the right deep venous system.  He does have reflux in the GSV at the knee and proximal calf.  He is not a candidate for laser ablation as he does not have reflux at the [facility] and the vein is not of adequate size.  -discussed with pt about wearing knee high 15-20 mmHg compression stockings but get swelling above the sock so we discussed thigh high compression and pt was measured for these today.    -discussed the importance of leg elevation and how to elevate properly - pt is advised to elevate their legs and a diagram is given to them to demonstrate for pt to lay flat on their back with knees elevated and slightly bent with their feet higher than their knees, which puts their feet higher than their heart for 15 minutes per day.  If pt cannot lay flat, advised to lay as flat as possible.  -pt is advised to continue as much walking as possible and avoid  sitting or standing for long periods of time.  -discussed importance of weight loss and exercise and that water aerobics would also be beneficial.  -handout with recommendations given -pt will f/u as needed.   -duplex revealed some increased pulsatility that could be related to right sided heart failure.  Pt does have 2 pillow orthopnea and occasional sob at night.  May benefit from referral to cardiology for further workup.     Doreatha Massed, [facility]ascular [facility] Vein  Specialists [phone]  Clinic MD:  Karin Lieu on call MD

## 2023-06-23 ENCOUNTER — Telehealth: Payer: Self-pay

## 2023-06-23 NOTE — Telephone Encounter (Signed)
Patient called regarding his visit with Vascular, stating that they had and was recommending patient to get a referral to cardiology. Please Review plan from note that was written at yesterday's visit with Vascular which stated the recommendation for the Referral to cardiology. Please advise.

## 2023-06-24 ENCOUNTER — Ambulatory Visit: Payer: Self-pay

## 2023-06-24 ENCOUNTER — Other Ambulatory Visit: Payer: Self-pay | Admitting: Family Medicine

## 2023-06-24 ENCOUNTER — Telehealth: Payer: Self-pay

## 2023-06-24 DIAGNOSIS — R0602 Shortness of breath: Secondary | ICD-10-CM

## 2023-06-24 NOTE — Telephone Encounter (Signed)
Patient informed. 

## 2023-06-24 NOTE — Patient Outreach (Signed)
  Care Coordination   06/24/2023 Name: Kevin Pena MRN: [id number] DOB: October 22, 1966   Care Coordination Outreach Attempts:  An unsuccessful telephone outreach was attempted for a scheduled appointment today.  Follow Up Plan:  Additional outreach attempts will be made to offer the patient care coordination information and services.   Encounter Outcome:  No Answer   Care Coordination Interventions:  No, not indicated    Rowe Pavy, RN, BSN, [facility] [employer] [phone]

## 2023-06-24 NOTE — Telephone Encounter (Signed)
I am ordering an echocardiogram and I did a referral to cardiology.

## 2023-06-24 NOTE — Progress Notes (Signed)
Echocardiogram and cardiology referral ordered based on recommendations from vascular surgery. See their office note.

## 2023-06-24 NOTE — Patient Outreach (Signed)
  Care Coordination   06/24/2023 Name: Kevin Pena MRN: [id number] DOB: 1966-03-26   Care Coordination Outreach Attempts:  A second unsuccessful outreach was attempted today to offer the patient with information about available care coordination services. Patient called back and request an additional call back. Attempted 2nd times without success.  Follow Up Plan:  Additional outreach attempts will be made to offer the patient care coordination information and services.   Encounter Outcome:  No Answer   Care Coordination Interventions:  No, not indicated    Rowe Pavy, RN, BSN, [facility] [employer] [phone]

## 2023-07-01 ENCOUNTER — Telehealth: Payer: Self-pay | Admitting: *Deleted

## 2023-07-01 NOTE — Progress Notes (Signed)
  Care Coordination Note  07/01/2023 Name: Kevin Pena MRN: [id number] DOB: [birth year]  Kevin Pena is a 57 y.o. year old male who is a primary care patient of Cox, Kirsten, MD and is actively engaged with the care management team. I reached out to [name] by phone today to assist with re-scheduling a follow up visit with the RN Case Manager  Follow up plan: Unsuccessful telephone outreach attempt made. A HIPAA compliant phone message was left for the patient providing contact information and requesting a return call.   Burman Nieves, CCMA Care Coordination Care Guide Direct Dial: [phone]

## 2023-07-03 ENCOUNTER — Other Ambulatory Visit: Payer: Self-pay

## 2023-07-03 MED ORDER — OXYCODONE HCL 20 MG PO TABS: 20.0000 mg | ORAL_TABLET | Freq: Four times a day (QID) | ORAL | 0 refills | Status: DC | PRN

## 2023-07-06 NOTE — Progress Notes (Signed)
  Care Coordination Note  07/06/2023 Name: Kevin Pena MRN: [id number] DOB: [birth year]  Kevin Pena is a 57 y.o. year old male who is a primary care patient of Cox, Kirsten, MD and is actively engaged with the care management team. I reached out to [name] by phone today to assist with re-scheduling a follow up visit with the RN Case Manager  Follow up plan: Telephone appointment with care management team member scheduled for: 07/09/2023  Burman Nieves, [facility] Care Coordination Care Guide Direct Dial: [phone]

## 2023-07-08 ENCOUNTER — Ambulatory Visit: Payer: Self-pay

## 2023-07-08 NOTE — Patient Outreach (Signed)
  Care Coordination   Initial Visit Note   07/08/2023 Name: Kevin Pena MRN: [id number] DOB: 1966/07/31  Kevin Pena is a 57 y.o. year old male who sees Cox, Kirsten, MD for primary care. I spoke with  [name] by phone today.  What matters to the patients health and wellness today?  Patient reports that he thinks that he has heart failure.  Reports the vascular doctor suggested the diagnosis of heart failure.       SDOH assessments and interventions completed:  No     Care Coordination Interventions:  Yes, provided   Interventions Today    Flowsheet Row Most Recent Value  Chronic Disease   Chronic disease during today's visit Congestive Heart Failure (CHF)  General Interventions   General Interventions Discussed/Reviewed General Interventions Discussed  Exercise Interventions   Exercise Discussed/Reviewed Physical Activity  Education Interventions   Education Provided Provided Education  [Provided education on low salt diet, daily weights.  Discussed importance of getting an official diagnosis before provided education to ensure the correct education is provided. Patient agreed.]  Pharmacy Interventions   Pharmacy Dicussed/Reviewed Medications and their functions        Follow up plan: Follow up call scheduled for 07/20/2023    Encounter Outcome:  Pt. Visit Completed   Rowe Pavy, RN, BSN, CEN [facility] Coordinator [phone]

## 2023-07-09 ENCOUNTER — Ambulatory Visit: Payer: Self-pay | Admitting: *Deleted

## 2023-07-09 NOTE — Patient Instructions (Signed)
Visit Information  Thank you for taking time to visit with me today. Please don't hesitate to contact me if I can be of assistance to you.   Following are the goals we discussed today:   Goals Addressed             This Visit's Progress    Reduce anxiety and complete Advance Directives       Activities and task to complete in order to accomplish goals.   You have decided not to move forward with counseling and would like to work with me on brief coping skills to assist you with managing anxiety Start / continue relaxed breathing 3 times daily Discuss with PCP and/or other Providers sleep issues and possible OTC and RX options Keep all upcoming appointment discussed today Continue with compliance of taking medication prescribed by Doctor Review your EMMI educational information   Look for an e-mail from [facility]. Review and consider completion of  Advance Directive packet- mailed to you   Have advance directive notarized and provide a copy to provider office  Find ways to reduce your worry and anxiety-  Call 988 for mental health crisis line (24/7)    ,          Our next appointment is by telephone on 08/13/23 at 1030  Please call the care guide team at [phone] if you need to cancel or reschedule your appointment.   If you are experiencing a Mental Health or Behavioral Health Crisis or need someone to talk to, please call the Suicide and Crisis Lifeline: 988 call 911   The patient verbalized understanding of instructions, educational materials, and care plan provided today and DECLINED offer to receive copy of patient instructions, educational materials, and care plan.   Telephone follow up appointment with care management team member scheduled for: 08/13/23  Reece Levy, MSW, LCSW Clinical Social Worker [facility] [phone]

## 2023-07-09 NOTE — Patient Outreach (Signed)
  Care Coordination   Follow Up Visit Note   07/09/2023 Name: Kevin Pena MRN: [id number] DOB: [birth year]  Kevin Pena is a 57 y.o. year old male who sees Cox, Kirsten, MD for primary care. I spoke with  [name] by phone today.  What matters to the patients health and wellness today?  Worried about his mom who fell and was taken to [facility]. Anxiety and insomnia persist.  Goals Addressed             This Visit's Progress    Reduce anxiety and complete Advance Directives       Activities and task to complete in order to accomplish goals.   You have decided not to move forward with counseling and would like to work with me on brief coping skills to assist you with managing anxiety Start / continue relaxed breathing 3 times daily Discuss with PCP and/or other Providers sleep issues and possible OTC and RX options Keep all upcoming appointment discussed today Continue with compliance of taking medication prescribed by Doctor Review your EMMI educational information   Look for an e-mail from [facility]. Review and consider completion of  Advance Directive packet- mailed to you   Have advance directive notarized and provide a copy to provider office  Find ways to reduce your worry and anxiety-  Call [phone] for mental health crisis line (24/7)    ,          SDOH assessments and interventions completed:  Yes     Care Coordination Interventions:  Yes, provided  Interventions Today    Flowsheet Row Most Recent Value  Chronic Disease   Chronic disease during today's visit Other  [anxiety]  Education Interventions   Provided Verbal Education On Mental Health/Coping with Illness, Medication  [Pt plans to discuss sleep aides with PCP after he has seen the Cardiologist- suggested he might send message via [facility]  Mental Health Interventions   Mental Health Discussed/Reviewed Mental Health Discussed, Mental Health Reviewed, Coping  Strategies, Anxiety, Other  [Pt admits to a lot of anxiety and worry- he also finds himself unable to sleep well- denies depression/SI. CSW encouraged pt to review EMMI material being emailed to him for info, tips, etc. Pt also has downloaded a sleep app on his phone tohelp]  Pharmacy Interventions   Pharmacy Dicussed/Reviewed Pharmacy Topics Discussed  [Plans to consider OTC and RX options to aide in his sleep after seeing both Cardiologist and PCP]  Safety Interventions   Safety Discussed/Reviewed Safety Discussed  [Pt reminded to call 988 if he needs mental health support 24/7]  Advanced Directive Interventions   Advanced Directives Discussed/Reviewed Advanced Directives Discussed, Provided resource for acquiring and filling out documents  [CSW mailed packet to pt- has not received yet]       Follow up plan: Follow up call scheduled for 08/13/23    Encounter Outcome:  Pt. Visit Completed

## 2023-07-14 ENCOUNTER — Ambulatory Visit: Payer: Medicare HMO | Attending: Family Medicine

## 2023-07-14 DIAGNOSIS — K648 Other hemorrhoids: Secondary | ICD-10-CM | POA: Insufficient documentation

## 2023-07-14 DIAGNOSIS — M1990 Unspecified osteoarthritis, unspecified site: Secondary | ICD-10-CM | POA: Insufficient documentation

## 2023-07-14 DIAGNOSIS — E785 Hyperlipidemia, unspecified: Secondary | ICD-10-CM | POA: Insufficient documentation

## 2023-07-14 DIAGNOSIS — R0602 Shortness of breath: Secondary | ICD-10-CM | POA: Diagnosis not present

## 2023-07-14 DIAGNOSIS — R21 Rash and other nonspecific skin eruption: Secondary | ICD-10-CM | POA: Insufficient documentation

## 2023-07-14 LAB — ECHOCARDIOGRAM COMPLETE
Area-P 1/2: 2.32 cm2
MV M vel: 4.02 m/s
MV Peak grad: 64.6 mmHg
MV VTI: 1.12 cm2
S' Lateral: 3.5 cm

## 2023-07-15 ENCOUNTER — Encounter: Payer: Self-pay | Admitting: Cardiology

## 2023-07-15 ENCOUNTER — Ambulatory Visit: Payer: Medicare HMO | Attending: Cardiology | Admitting: Cardiology

## 2023-07-15 VITALS — BP 120/82 | HR 57 | Ht 70.0 in | Wt 200.4 lb

## 2023-07-15 DIAGNOSIS — E782 Mixed hyperlipidemia: Secondary | ICD-10-CM

## 2023-07-15 DIAGNOSIS — I10 Essential (primary) hypertension: Secondary | ICD-10-CM

## 2023-07-15 DIAGNOSIS — B20 Human immunodeficiency virus [HIV] disease: Secondary | ICD-10-CM | POA: Diagnosis not present

## 2023-07-15 DIAGNOSIS — E785 Hyperlipidemia, unspecified: Secondary | ICD-10-CM | POA: Diagnosis not present

## 2023-07-15 DIAGNOSIS — R0609 Other forms of dyspnea: Secondary | ICD-10-CM | POA: Insufficient documentation

## 2023-07-15 NOTE — Patient Instructions (Signed)
Medication Instructions:  Your physician recommends that you continue on your current medications as directed. Please refer to the Current Medication list given to you today.  *If you need a refill on your cardiac medications before your next appointment, please call your pharmacy*   Lab Work: None ordered If you have labs (blood work) drawn today and your tests are completely normal, you will receive your results only by: MyChart Message (if you have MyChart) OR A paper copy in the mail If you have any lab test that is abnormal or we need to change your treatment, we will call you to review the results.   Testing/Procedures:  Stress Echocardiogram Instructions:    1. You may take all of your medications.  2. No food, drink or tobacco products four hours prior to your test.  3. Dress prepared to exercise. Best to wear 2 piece outfit and tennis shoes. Shoes must be closed toe.  4. Please bring all current prescription medications.  Follow-Up: At [facility], you and your health needs are our priority.  As part of our continuing mission to provide you with exceptional heart care, we have created designated Provider Care Teams.  These Care Teams include your primary Cardiologist (physician) and Advanced Practice Providers (APPs -  Physician Assistants and Nurse Practitioners) who all work together to provide you with the care you need, when you need it.  We recommend signing up for the patient portal called "MyChart".  Sign up information is provided on this After Visit Summary.  MyChart is used to connect with patients for Virtual Visits (Telemedicine).  Patients are able to view lab/test results, encounter notes, upcoming appointments, etc.  Non-urgent messages can be sent to your provider as well.   To learn more about what you can do with MyChart, go to [email or web address].    Your next appointment:   9 month(s)  The format for your next appointment:   In  Person  Provider:   Belva Crome, MD   Other Instructions Exercise Stress Echocardiogram An exercise stress echocardiogram is a test to check how well your heart is working. This test uses sound waves and a computer to make pictures of your heart. These pictures will be taken before and after you exercise. For this test, you will walk on a treadmill or ride a bicycle to make your heart beat faster. While you exercise, your heart will be checked with an electrocardiogram (ECG). Your blood pressure will also be checked. You may have this test if: You have chest pain or a heart problem. You had a heart attack or heart surgery not long ago. You have heart valve problems. You have a condition that causes narrowing of the blood vessels that supply your heart. You have a high risk of heart disease and: You are starting a new exercise program. You need to have a big surgery. Tell a doctor about: Any allergies you have. All medicines you are taking. This includes vitamins, herbs, eye drops, creams, and over-the-counter medicines. Any problems you or family members have had with medicines that make you fall asleep (anesthetic medicines). Any surgeries you have had. Any blood disorders you have. Any medical conditions you have. Whether you are pregnant or may be pregnant. What are the risks? Generally, this is a safe test. However, problems may occur, including: Chest pain. Feeling dizzy or light-headed. Shortness of breath. Increased or irregular heartbeat. Feeling like you may vomit (nausea) or vomiting. Heart attack. This is very rare. What happens  before the test? Medicines Ask your doctor about changing or stopping your normal medicines. This is important if you take diabetes medicines or blood thinners. If you use an inhaler, bring it to the test. General instructions Wear comfortable clothes and walking shoes. Follow instructions from your doctor about what you cannot eat or  drink before the test. Do not drink or eat anything that has caffeine in it. Stop having caffeine 24 hours before the test. Do not smoke or use products that contain nicotine or tobacco for 4 hours before the test. If you need help quitting, ask your doctor. What happens during the test?  You will take off your clothes from the waist up and put on a hospital gown. Electrodes or patches will be put on your chest. A blood pressure cuff will be put on your arm. Before you exercise, a computer will make a picture of your heart. To do this: You will lie down and a gel will be put on your chest. A wand will be moved over the gel. Sound waves from the wand will go to the computer to make the picture. Then, you will start to exercise. You may walk on a treadmill or pedal a bicycle. Your blood pressure and heart rhythm will be checked while you exercise. The exercise will get harder or faster. You will exercise until: Your heart reaches a certain level. You are too tired to go on. You cannot go on because of chest pain, weakness, or dizziness. You will lie down right away so another picture of your heart can be taken. The procedure may vary among doctors and hospitals. What can I expect after the test? After your test, it is common to have: Mild soreness. Mild tiredness. Your heart rate and blood pressure will be checked until they return to your normal levels. You should not have any new symptoms after this test. Follow these instructions at home: If your doctor says that you can, you may: Eat what you normally eat. Do your normal activities. Take over-the-counter and prescription medicines only as told by your doctor. Keep all follow-up visits. It is up to you to get the results of your test. Ask how to get your results when they are ready. Contact a doctor if: You feel dizzy or light-headed. You have a fast or irregular heartbeat. You feel like you may vomit or you vomit. You have a  headache. You feel short of breath. Get help right away if: You develop pain or pressure: In your chest. In your jaw or neck. Between your shoulders. That goes down your left arm. You faint. You have trouble breathing. These symptoms may be an emergency. Get medical help right away. Call your local emergency services (911 in the [place] Do not wait to see if the symptoms will go away. Do not drive yourself to the hospital. Summary This is a test that checks how well your heart is working. Follow instructions about what you cannot eat or drink before the test. Ask your doctor if you should take your normal medicines before the test. Stop having caffeine 24 hours before the test. Do not smoke or use products with nicotine or tobacco in them for 4 hours before the test. During the test, your blood pressure and heart rhythm will be checked while you exercise. This information is not intended to replace advice given to you by your health care provider. Make sure you discuss any questions you have with your health care provider. Document  Revised: 07/24/2021 Document Reviewed: 07/03/2020 Elsevier Patient Education  2022 [employer].

## 2023-07-15 NOTE — Progress Notes (Signed)
Cardiology Office Note:    Date:  07/15/2023   ID:  Kevin Pena, DOB August 16, 1966, MRN [id number]  PCP:  Blane Ohara, MD  Cardiologist:  Garwin Brothers, MD   Referring MD: Blane Ohara, MD    ASSESSMENT:    1. Primary hypertension   2. Hyperlipidemia, unspecified hyperlipidemia type   3. Mixed hyperlipidemia   4. DOE (dyspnea on exertion)   5. HIV infection, unspecified symptom status (HCC)    PLAN:    In order of problems listed above:  Primary prevention stressed with the patient.  Importance of compliance with diet medication stressed and patient verbalized standing. Essential hypertension: Blood pressure stable and diet was emphasized. Mixed dyslipidemia: On lipid-lowering medications followed by primary care. Dyspnea on exertion: Will do exercise stress echo to assess his symptoms.  I reviewed echo and that was largely unremarkable. HIV positive status: Followed by primary care and his infectious disease specialist. Patient will be seen in follow-up appointment in 6 months or earlier if the patient has any concerns.    Medication Adjustments/Labs and Tests Ordered: Current medicines are reviewed at length with the patient today.  Concerns regarding medicines are outlined above.  Orders Placed This Encounter  Procedures   EKG 12-Lead   ECHOCARDIOGRAM STRESS TEST   No orders of the defined types were placed in this encounter.    History of Present Illness:    Kevin Pena is a 57 y.o. male who is being seen today for the evaluation of pedal edema at the request of Cox, Kirsten, MD. patient is a pleasant 57 year old male.  He has past medical history of HIV positivity, essential hypertension and mixed dyslipidemia.  He mentions to me that he has been told to be evaluated by a cardiologist.  He has right leg swelling which has been going on for the past several years.  He is told to have venous reflux.  He has had multiple evaluations for DVT in the past  which were negative.  He denies any chest pain orthopnea or PND.  At the time of my evaluation, the patient is alert awake oriented and in no distress.  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   Fecal urgency 09/15/2014   GERD (gastroesophageal reflux disease)    HIV (human immunodeficiency virus infection) (HCC)    Hyperlipidemia    Hypertension 03/29/2014   Internal hemorrhoids    Left hip pain 05/18/2022   Long-term current use of opiate analgesic 03/14/2020   Lumbar pain 05/18/2022   Mixed hyperlipidemia 03/29/2014   Neuropathy 06/18/2017   Opioid use 09/14/2022   Plantar fasciitis of right foot 01/04/2016   Postoperative examination 03/10/2016   Preoperative clearance 12/24/2022   Presence of tooth-root and mandibular implants 05/29/2020   Rash left leg-- using topical cream   Spondylosis with myelopathy, 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   Thrombosed external hemorrhoid 01/28/2016   Tobacco abuse 11/06/2016   Toenail deformity 05/31/2023    Past Surgical History:  Procedure Laterality Date   APPENDECTOMY  1981  CHOLECYSTECTOMY  2006   dental implants  11/2020   FOOT SURGERY     HAMMER TOE SURGERY  OCT 2010   INTERSTIM IMPLANT PLACEMENT  06/17/2012   Procedure: Leane Platt IMPLANT FIRST STAGE;  Surgeon: Martina Sinner, MD;  Location: [facility];  Service: Urology;  Laterality: N/A;  RAD TECH OK PER VICKIE AT MAIN OR    INTERSTIM IMPLANT PLACEMENT  06/17/2012   Procedure:  INTERSTIM IMPLANT SECOND STAGE;  Surgeon: Martina Sinner, MD;  [facility];  Service: Urology;  Laterality: N/A;   MOUTH SURGERY  02/16/2020   REPAIR RECURRENT RIGHT INGUINAL HERNIA  2000   RIGHT INGUINAL HERNIA REPAIR  1997   UMBILICAL HERNIA REPAIR  2010    Current Medications: Current Meds  Medication Sig   albuterol (VENTOLIN HFA) 108 (90 Base) MCG/ACT inhaler TAKE 2 PUFFS BY MOUTH EVERY 6 HOURS AS NEEDED FOR WHEEZE OR SHORTNESS OF BREATH   atorvastatin (LIPITOR) 20 MG tablet TAKE 1 TABLET BY MOUTH EVERY DAY   esomeprazole (NEXIUM) 40 MG capsule TAKE 1 CAPSULE BY MOUTH TWICE A DAY   fluticasone (FLONASE) 50 MCG/ACT nasal spray SPRAY 1 SPRAY INTO EACH NOSTRIL EVERY DAY AS NEEDED FOR ALLERGIES   hydrochlorothiazide (HYDRODIURIL) 25 MG tablet Take 1 tablet (25 mg total) by mouth every evening.   linaclotide (LINZESS) 72 MCG capsule TAKE 1 CAPSULE BY MOUTH DAILY BEFORE BREAKFAST.   ODEFSEY 200-25-25 MG TABS tablet Take 1 tablet by mouth every morning.   Oxycodone HCl 20 MG TABS Take 1 tablet (20 mg total) by mouth 4 (four) times daily as needed.   promethazine (PHENERGAN) 25 MG tablet TAKE 1 TABLET BY MOUTH EVERY 8 HOURS AS NEEDED FOR NAUSEA OR VOMITING.   valACYclovir (VALTREX) 500 MG tablet Take 1 tablet by mouth daily as needed (outbreaks).      Allergies:   Adhesive [tape], Ampicillin, Doxycycline, Hydroxyzine, and Lyrica [pregabalin]   Social History   Socioeconomic History   Marital status: Significant Other    Spouse name: [name]   Number of children: 1   Years of education: College   Highest education level: Not on file  Occupational History    Employer: OTHER    Comment: disability  Tobacco Use   Smoking status: Former    Current packs/day: 0.00    Average packs/day: 1 pack/day for 30.0 years (30.0 ttl pk-yrs)    Types: Cigarettes    Start date: 06/11/1980    Quit date: 06/11/2010    Years since quitting: 13.1   Smokeless tobacco:  Never  Vaping Use   Vaping status: Some Days  Substance and Sexual Activity   Alcohol use: Yes    Alcohol/week: 2.0 standard drinks of alcohol    Types: 2 Cans of beer per week    Comment: rarely   Drug use: No   Sexual activity: Not Currently    Partners: Male  Other Topics Concern   Not on file  Social History Narrative   Patient lives at home with partner.   Caffeine Use: 32oz daily coffee/tea      Disability.    Social Determinants of Health   Financial Resource Strain: Low Risk  (06/18/2023)   Overall Financial Resource Strain (CARDIA)    Difficulty of Paying Living Expenses: Not hard at all  Food Insecurity: No Food Insecurity (06/18/2023)   Hunger Vital Sign    Worried About Running Out of Food in the Last Year: Never true  Ran Out of Food in the Last Year: Never true  Transportation Needs: No Transportation Needs (06/18/2023)   PRAPARE - Administrator, Civil Service (Medical): No    Lack of Transportation (Non-Medical): No  Physical Activity: Sufficiently Active (04/23/2023)   Exercise Vital Sign    Days of Exercise per Week: 5 days    Minutes of Exercise per Session: 30 min  Stress: No Stress Concern Present (06/18/2023)   [employer] of Occupational Health - Occupational Stress Questionnaire    Feeling of Stress : Only a little  Social Connections: Moderately Isolated (05/26/2023)   Social Connection and Isolation Panel [NHANES]    Frequency of Communication with Friends and Family: Twice a week    Frequency of Social Gatherings with Friends and Family: Twice a week    Attends Religious Services: Never    Database administrator or Organizations: No    Attends Engineer, structural: Never    Marital Status: Living with partner     Family History: The patient's family history includes Diabetes in his father and mother; Heart Problems in his father; Heart disease in his father; Hyperlipidemia in his father and mother; Hypertension in his  father and mother; Other in his mother; Varicose Veins in his mother. There is no history of Pancreatic cancer, Rectal cancer, Stomach cancer, or Colon cancer.  ROS:   Please see the history of present illness.    All other systems reviewed and are negative.  EKGs/Labs/Other Studies Reviewed:    The following studies were reviewed today:  .Marland KitchenEKG Interpretation Date/Time:  Wednesday July 15 2023 14:51:05 EDT Ventricular Rate:  57 PR Interval:  118 QRS Duration:  92 QT Interval:  400 QTC Calculation: 389 R Axis:   17  Text Interpretation: Sinus bradycardia Otherwise normal ECG Confirmed by Belva Crome [phone]) on 07/15/2023 3:24:50 PM  EKG Interpretation Date/Time:  Wednesday July 15 2023 14:51:05 EDT Ventricular Rate:  57 PR Interval:  118 QRS Duration:  92 QT Interval:  400 QTC Calculation: 389 R Axis:   17  Text Interpretation: Sinus bradycardia Otherwise normal ECG Confirmed by Belva Crome [phone]) on 07/15/2023 3:24:50 PM   EKG Interpretation Date/Time:  Wednesday July 15 2023 14:51:05 EDT Ventricular Rate:  57 PR Interval:  118 QRS Duration:  92 QT Interval:  400 QTC Calculation: 389 R Axis:   17  Text Interpretation: Sinus bradycardia Otherwise normal ECG Confirmed by Belva Crome [phone]) on 07/15/2023 3:24:50 PM     Recent Labs: 05/26/2023: ALT 14; BUN 21; Creatinine, Ser 1.30; Hemoglobin 14.4; Platelets 186; Potassium 4.7; Sodium 138  Recent Lipid Panel    Component Value Date/Time   CHOL 164 05/26/2023 1429   TRIG 87 05/26/2023 1429   HDL 56 05/26/2023 1429   CHOLHDL 2.9 05/26/2023 1429   LDLCALC 92 05/26/2023 1429    Physical Exam:    VS:  BP 120/82   Pulse (!) 57   Ht 5\' 10"  (1.778 m)   Wt 200 lb 6.4 oz (90.9 kg)   SpO2 97%   BMI 28.75 kg/m     Wt Readings from Last 3 Encounters:  07/15/23 200 lb 6.4 oz (90.9 kg)  06/22/23 198 lb (89.8 kg)  05/26/23 203 lb (92.1 kg)     GEN: Patient is in no acute distress HEENT: Normal NECK:  No JVD; No carotid bruits LYMPHATICS: No lymphadenopathy CARDIAC: S1 S2 regular, 2/6 systolic murmur at the apex. RESPIRATORY:  Clear to auscultation without rales,  wheezing or rhonchi  ABDOMEN: Soft, non-tender, non-distended MUSCULOSKELETAL:  No edema; No deformity  SKIN: Warm and dry NEUROLOGIC:  Alert and oriented x 3 PSYCHIATRIC:  Normal affect    Signed, Garwin Brothers, MD  07/15/2023 3:38 PM    [facility]

## 2023-07-16 ENCOUNTER — Encounter: Payer: Self-pay | Admitting: Podiatry

## 2023-07-16 ENCOUNTER — Other Ambulatory Visit: Payer: Self-pay | Admitting: Podiatry

## 2023-07-16 ENCOUNTER — Telehealth: Payer: Self-pay | Admitting: Podiatry

## 2023-07-16 DIAGNOSIS — B351 Tinea unguium: Secondary | ICD-10-CM

## 2023-07-16 NOTE — Telephone Encounter (Signed)
pt called in about toenail test results,states that he never got a call back

## 2023-07-16 NOTE — Progress Notes (Signed)
Received verbal report from fungal culture via [facility] over the phone.  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  Messaged patient through MyChart to see about getting liver function panel to initiate oral terbinafine therapy.  Awaiting response from patient.

## 2023-07-28 ENCOUNTER — Telehealth: Payer: Self-pay

## 2023-07-28 DIAGNOSIS — B351 Tinea unguium: Secondary | ICD-10-CM | POA: Diagnosis not present

## 2023-07-28 NOTE — Telephone Encounter (Signed)
Detailed instructions left on the patient's answering machine. Asked to call back with any questions. S.Williams EMTP/CCT 

## 2023-07-29 ENCOUNTER — Encounter: Payer: Self-pay | Admitting: Podiatry

## 2023-07-29 LAB — HEPATIC FUNCTION PANEL
ALT: 13 IU/L (ref 0–44)
AST: 20 IU/L (ref 0–40)
Albumin: 4.2 g/dL (ref 3.8–4.9)
Alkaline Phosphatase: 90 IU/L (ref 44–121)
Bilirubin Total: 0.4 mg/dL (ref 0.0–1.2)
Bilirubin, Direct: 0.1 mg/dL (ref 0.00–0.40)
Total Protein: 6.7 g/dL (ref 6.0–8.5)

## 2023-07-29 MED ORDER — TERBINAFINE HCL 250 MG PO TABS
250.0000 mg | ORAL_TABLET | Freq: Every day | ORAL | 0 refills | Status: DC
Start: 2023-07-29 — End: 2023-12-22

## 2023-07-29 NOTE — Addendum Note (Signed)
Addended byLucia Estelle D on: 07/29/2023 07:40 AM   Modules accepted: Orders

## 2023-07-30 ENCOUNTER — Ambulatory Visit: Payer: Self-pay

## 2023-07-30 NOTE — Patient Outreach (Signed)
  Care Coordination   Follow Up Visit Note   07/30/2023 Name: Kevin Pena MRN: [id number] DOB: 1966/09/03  Kevin Pena is a 57 y.o. year old male who sees Cox, Kirsten, MD for primary care. I spoke with  [name] by phone today.  What matters to the patients health and wellness today?  Reports that cardiology appointment went well. Reports that he was ordered to have a stress test. 08/05/2023.  Reports fungal infection and has started new medications.  Reports decrease in energy.  Patient remains unsure if he wants nursing assistance. States that he will call me if needed.   SDOH assessments and interventions completed:  No     Care Coordination Interventions:  Yes, provided   Interventions Today    Flowsheet Row Most Recent Value  Chronic Disease   Chronic disease during today's visit Hypertension (HTN)  General Interventions   General Interventions Discussed/Reviewed General Interventions Discussed, Doctor Visits  Doctor Visits Discussed/Reviewed Specialist  Exercise Interventions   Exercise Discussed/Reviewed Physical Activity  Education Interventions   Education Provided Provided Education  [Reviewed potential causes of low energy. Encouraged healthy sleep pattern, good nutrition, and reduce stress.]  Provided Verbal Education On Nutrition, When to see the doctor, Medication, Exercise  Nutrition Interventions   Nutrition Discussed/Reviewed Nutrition Discussed  Pharmacy Interventions   Pharmacy Dicussed/Reviewed Medications and their functions        Follow up plan: No further intervention required.   Encounter Outcome:  Patient Visit Completed   Rowe Pavy, RN, BSN, [facility] Coordinator [phone]

## 2023-08-04 ENCOUNTER — Other Ambulatory Visit: Payer: Self-pay

## 2023-08-04 MED ORDER — OXYCODONE HCL 20 MG PO TABS: 20.0000 mg | ORAL_TABLET | Freq: Four times a day (QID) | ORAL | 0 refills | Status: DC | PRN

## 2023-08-05 ENCOUNTER — Ambulatory Visit: Payer: Medicare HMO | Attending: Cardiology

## 2023-08-05 ENCOUNTER — Ambulatory Visit: Payer: Medicare HMO

## 2023-08-05 DIAGNOSIS — R0609 Other forms of dyspnea: Secondary | ICD-10-CM

## 2023-08-05 MED ORDER — PERFLUTREN LIPID MICROSPHERE
1.0000 mL | INTRAVENOUS | Status: AC | PRN
Start: 2023-08-05 — End: 2023-08-05
  Administered 2023-08-05: 8 mL via INTRAVENOUS

## 2023-08-13 ENCOUNTER — Encounter: Payer: Self-pay | Admitting: *Deleted

## 2023-08-13 ENCOUNTER — Telehealth: Payer: Self-pay | Admitting: *Deleted

## 2023-08-13 NOTE — Patient Outreach (Signed)
Care Coordination   08/13/2023 Name: Lejon Antonovich MRN: [id number] DOB: Mar 14, 1966   Care Coordination Outreach Attempts:  An unsuccessful telephone outreach was attempted today to offer the patient information about available care coordination services.  Follow Up Plan:  Additional outreach attempts will be made to offer the patient care coordination information and services.   Encounter Outcome:  No Answer   Care Coordination Interventions:  No, not indicated    Reece Levy, MSW, LCSW Clinical Social Worker [phone]

## 2023-08-14 DIAGNOSIS — Z008 Encounter for other general examination: Secondary | ICD-10-CM | POA: Diagnosis not present

## 2023-08-17 ENCOUNTER — Encounter: Payer: Self-pay | Admitting: Podiatry

## 2023-08-17 ENCOUNTER — Telehealth: Payer: Self-pay | Admitting: *Deleted

## 2023-08-17 NOTE — Progress Notes (Unsigned)
Care Coordination Note  08/17/2023 Name: [name] MRN: [id number] DOB: [birth year]  Kevin Pena is a 57 y.o. year old male who is a primary care patient of Cox, Kirsten, MD and is actively engaged with the care management team. I reached out to [name] by phone today to assist with re-scheduling a follow up visit with the Licensed Clinical Social Worker  Follow up plan: Unsuccessful telephone outreach attempt made. A HIPAA compliant phone message was left for the patient providing contact information and requesting a return call.   Burman Nieves, CCMA Care Coordination Care Guide Direct Dial: [phone]

## 2023-08-19 NOTE — Progress Notes (Signed)
Care Coordination Note  08/19/2023 Name: Kevin Pena MRN: [id number] DOB: [birth year]  Kevin Pena is a 57 y.o. year old male who is a primary care patient of Cox, Kirsten, MD and is actively engaged with the care management team. I reached out to [name] by phone today to assist with re-scheduling a follow up visit with the Licensed Clinical Social Worker  Follow up plan: Telephone appointment with care management team member scheduled for: 09/02/2023  Burman Nieves, [facility] Care Coordination Care Guide Direct Dial: [phone]

## 2023-08-27 ENCOUNTER — Other Ambulatory Visit: Payer: Self-pay | Admitting: Family Medicine

## 2023-08-31 ENCOUNTER — Ambulatory Visit: Payer: Medicare HMO | Admitting: Family Medicine

## 2023-08-31 ENCOUNTER — Other Ambulatory Visit: Payer: Self-pay

## 2023-08-31 MED ORDER — OXYCODONE HCL 20 MG PO TABS: 20.0000 mg | ORAL_TABLET | Freq: Four times a day (QID) | ORAL | 0 refills | Status: DC | PRN

## 2023-09-02 ENCOUNTER — Ambulatory Visit: Payer: Self-pay | Admitting: *Deleted

## 2023-09-02 NOTE — Patient Outreach (Addendum)
Care Coordination   Follow Up Visit Note   09/02/2023 Name: Florentino Dantin MRN: [id number] DOB: 1966-03-29  Aniseto Johnes is a 57 y.o. year old male who sees Cox, Kirsten, MD for primary care. I spoke with  [name] by phone today.  What matters to the patients health and wellness today?  Lost 2 Aunts back to back in the last week.     Goals Addressed             This Visit's Progress    Reduce anxiety and complete Advance Directives       Activities and task to complete in order to accomplish goals.   You have decided not to move forward with counseling and would like to work with me on brief coping skills to assist you with managing anxiety Start / continue relaxed breathing 3 times daily Reschedule and discuss with PCP and/or other Providers sleep issues and possible OTC and RX options Keep all upcoming appointment discussed today Continue with compliance of taking medication prescribed by Doctor Review your EMMI educational information   Look for an e-mail from [facility]. Review and consider completion of  Advance Directive packet- mailed to you   Have advance directive notarized and provide a copy to provider office  Find ways to reduce your worry and anxiety-  Call 988 for mental health crisis line (24/7)    ,          SDOH assessments and interventions completed:  Yes     Care Coordination Interventions:  Yes, provided  Interventions Today    Flowsheet Row Most Recent Value  Chronic Disease   Chronic disease during today's visit Other  [anxiety]  Exercise Interventions   Exercise Discussed/Reviewed Physical Activity  [Continues to work on the farm]  Mental Health Interventions   Mental Health Discussed/Reviewed Coping Strategies, Grief and Loss, Anxiety, Depression  [Pt denies depression- acknowledges being "sad" with the recent loss of 2 Aunts. Coping appropriately. Feels his anxiety is also minimal at this time- does  plan to discuss RX for sleep and other with PCP at next visit]  Pharmacy Interventions   Pharmacy Dicussed/Reviewed Pharmacy Topics Discussed  [Pt has been taking Melatonin and plans to discuss further options with PCP for sleep]  Safety Interventions   Safety Discussed/Reviewed Safety Discussed  [Pt reminded about positive self care. also "988" number to call if mental health support needed]  Advanced Directive Interventions   Advanced Directives Discussed/Reviewed Advanced Directives Discussed  [Pt has received packet and will review/complete as time permits]       Follow up plan: Follow up call scheduled for 09/09/23    Encounter Outcome:  Patient Visit Completed

## 2023-09-02 NOTE — Patient Instructions (Signed)
Visit Information  Thank you for taking time to visit with me today. Please don't hesitate to contact me if I can be of assistance to you.   Following are the goals we discussed today:   Goals Addressed             This Visit's Progress    Reduce anxiety and complete Advance Directives       Activities and task to complete in order to accomplish goals.   Start / continue relaxed breathing 3 times daily Reschedule and discuss with PCP and/or other Providers sleep issues and possible OTC and RX options Keep all upcoming appointment discussed today Continue with compliance of taking medication prescribed by Doctor Review your EMMI educational information   Look for an e-mail from [facility]. Review and consider completion of  Advance Directive packet- mailed to you   Have advance directive notarized and provide a copy to provider office  Find ways to reduce your worry and anxiety-  Call [phone] for mental health crisis line (24/7)    ,          Our next appointment is by telephone on 09/09/23  Please call the care guide team at [phone] if you need to cancel or reschedule your appointment.   If you are experiencing a Mental Health or Behavioral Health Crisis or need someone to talk to, please call the Suicide and Crisis Lifeline: 988 call 911   The patient verbalized understanding of instructions, educational materials, and care plan provided today and DECLINED offer to receive copy of patient instructions, educational materials, and care plan.   Telephone follow up appointment with care management team member scheduled for: 09/09/23  Reece Levy, MSW, LCSW Clinical Social Worker [phone]

## 2023-09-04 ENCOUNTER — Other Ambulatory Visit: Payer: Self-pay | Admitting: Family Medicine

## 2023-09-07 ENCOUNTER — Ambulatory Visit: Payer: Medicare HMO | Admitting: Family Medicine

## 2023-09-07 ENCOUNTER — Encounter: Payer: Self-pay | Admitting: Family Medicine

## 2023-09-07 VITALS — BP 138/60 | HR 58 | Temp 97.2°F | Resp 16 | Ht 70.0 in | Wt 203.0 lb

## 2023-09-07 DIAGNOSIS — Z23 Encounter for immunization: Secondary | ICD-10-CM | POA: Diagnosis not present

## 2023-09-07 DIAGNOSIS — E782 Mixed hyperlipidemia: Secondary | ICD-10-CM

## 2023-09-07 DIAGNOSIS — K5909 Other constipation: Secondary | ICD-10-CM | POA: Diagnosis not present

## 2023-09-07 DIAGNOSIS — Z21 Asymptomatic human immunodeficiency virus [HIV] infection status: Secondary | ICD-10-CM

## 2023-09-07 DIAGNOSIS — K2100 Gastro-esophageal reflux disease with esophagitis, without bleeding: Secondary | ICD-10-CM | POA: Diagnosis not present

## 2023-09-07 DIAGNOSIS — G90521 Complex regional pain syndrome I of right lower limb: Secondary | ICD-10-CM

## 2023-09-07 DIAGNOSIS — F1120 Opioid dependence, uncomplicated: Secondary | ICD-10-CM | POA: Diagnosis not present

## 2023-09-07 DIAGNOSIS — Z9189 Other specified personal risk factors, not elsewhere classified: Secondary | ICD-10-CM | POA: Diagnosis not present

## 2023-09-07 DIAGNOSIS — I10 Essential (primary) hypertension: Secondary | ICD-10-CM | POA: Diagnosis not present

## 2023-09-07 DIAGNOSIS — Z1159 Encounter for screening for other viral diseases: Secondary | ICD-10-CM

## 2023-09-07 NOTE — Progress Notes (Unsigned)
Subjective:  Patient ID: Kevin Pena, male    DOB: 04/04/1966  Age: 57 y.o. MRN: [id number]  Chief Complaint  Patient presents with   Medical Management of Chronic Issues    HPI Patient is a 57 year old white male with GERD, hyperlipidemia, hypertension, chronic pain syndrome, chronic regional pain syndrome of right lower limb, chronic constipation, HIV and HSV who presents for chronic follow-up.   Chronic pain syndrome/regional pain syndrome: On oxycodone 20 mg 4 times daily as needed. Complex regional pain syndrome 1 of lower right leg.    Insomnia/osa: Using CPAP  HIV: On Odefsey one daily.  Per the patient his T-cell numbers are nondetectible. Sees ID doctot.  Hypertension: On hydrochlorothiazide 25 mg nightly.   GERD: On Nexium 40 mg twice daily. If goes down to once daily, develops reflux. Well controlled at twice daily.    Constipation: On Linzess 72 mcg every morning.   Hyperlipidemia: On Lipitor 20 mg nightly.   Eats fairly healthy.       09/07/2023    1:32 PM 06/18/2023    1:27 PM 06/18/2023    1:22 PM 06/18/2023    1:20 PM 06/18/2023    1:19 PM  Depression screen PHQ 2/9  Decreased Interest 0 0 0 0 0  Down, Depressed, Hopeless 0 1 0 0 0  PHQ - 2 Score 0 1 0 0 0  Altered sleeping 2 1     Tired, decreased energy 1 0     Change in appetite 1 1     Feeling bad or failure about yourself  0 0     Trouble concentrating 0 0     Moving slowly or fidgety/restless 0 0     Suicidal thoughts 0 0     PHQ-9 Score 4 3     Difficult doing work/chores Somewhat difficult            09/07/2023    1:32 PM  Fall Risk   Falls in the past year? 0  Number falls in past yr: 0  Injury with Fall? 0  Risk for fall due to : No Fall Risks  Follow up Falls evaluation completed;Follow up appointment    Patient Care Team: Blane Ohara, MD as PCP - General (Internal Medicine) Desarea Ohagan, Janet Berlin, MD as Referring Physician (Infectious Diseases) Buck Mam, LCSW as Social  Worker   Review of Systems  Constitutional:  Negative for chills and fever.  HENT:  Negative for congestion, rhinorrhea and sore throat.   Respiratory:  Negative for cough and shortness of breath.   Cardiovascular:  Positive for leg swelling. Negative for chest pain and palpitations.  Gastrointestinal:  Positive for abdominal pain and constipation. Negative for diarrhea, nausea and vomiting.  Genitourinary:  Negative for dysuria and urgency.  Musculoskeletal:  Negative for arthralgias, back pain and myalgias.  Neurological:  Negative for dizziness and headaches.  Psychiatric/Behavioral:  Negative for dysphoric mood. The patient is not nervous/anxious.     Current Outpatient Medications on File Prior to Visit  Medication Sig Dispense Refill   albuterol (VENTOLIN HFA) 108 (90 Base) MCG/ACT inhaler TAKE 2 PUFFS BY MOUTH EVERY 6 HOURS AS NEEDED FOR WHEEZE OR SHORTNESS OF BREATH 17 each 3   atorvastatin (LIPITOR) 20 MG tablet TAKE 1 TABLET BY MOUTH EVERY DAY 90 tablet 0   esomeprazole (NEXIUM) 40 MG capsule TAKE 1 CAPSULE BY MOUTH TWICE A DAY 180 capsule 1   fluticasone (FLONASE) 50 MCG/ACT nasal spray SPRAY 1 SPRAY INTO  EACH NOSTRIL EVERY DAY AS NEEDED FOR ALLERGIES 48 mL 2   hydrochlorothiazide (HYDRODIURIL) 25 MG tablet TAKE 1 TABLET BY MOUTH EVERY EVENING 90 tablet 3   linaclotide (LINZESS) 72 MCG capsule TAKE 1 CAPSULE BY MOUTH DAILY BEFORE BREAKFAST. 30 capsule 0   ODEFSEY 200-25-25 MG TABS tablet Take 1 tablet by mouth every morning.     Oxycodone HCl 20 MG TABS Take 1 tablet (20 mg total) by mouth 4 (four) times daily as needed. 120 tablet 0   promethazine (PHENERGAN) 25 MG tablet TAKE 1 TABLET BY MOUTH EVERY 8 HOURS AS NEEDED FOR NAUSEA OR VOMITING. 30 tablet 1   terbinafine (LAMISIL) 250 MG tablet Take 1 tablet (250 mg total) by mouth daily. 90 tablet 0   valACYclovir (VALTREX) 500 MG tablet Take 1 tablet by mouth daily as needed (outbreaks).      No current facility-administered  medications on file prior to visit.   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   Fecal urgency 09/15/2014   GERD (gastroesophageal reflux disease)    HIV (human immunodeficiency virus infection) (HCC)    Hyperlipidemia    Hypertension 03/29/2014   Internal hemorrhoids    Left hip pain 05/18/2022   Long-term current use of opiate analgesic 03/14/2020   Lumbar pain 05/18/2022   Mixed hyperlipidemia 03/29/2014   Neuropathy 06/18/2017   Opioid use 09/14/2022   Plantar fasciitis of right foot 01/04/2016   Postoperative examination 03/10/2016   Preoperative clearance 12/24/2022   Presence of tooth-root and mandibular implants 05/29/2020   Rash left leg-- using topical cream   Spondylosis with myelopathy, 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   Thrombosed external hemorrhoid 01/28/2016   Tobacco abuse 11/06/2016   Toenail deformity 05/31/2023   Past Surgical History:  Procedure Laterality Date   APPENDECTOMY  1981   CHOLECYSTECTOMY  2006   dental implants  11/2020   FOOT SURGERY     HAMMER TOE SURGERY  OCT 2010   INTERSTIM IMPLANT PLACEMENT  06/17/2012   Procedure: Leane Platt IMPLANT FIRST STAGE;  Surgeon: Martina Sinner, MD;  Location: [facility] [place];  Service: Urology;  Laterality: N/A;  RAD TECH OK PER VICKIE AT MAIN OR    INTERSTIM IMPLANT PLACEMENT  06/17/2012   Procedure: INTERSTIM IMPLANT SECOND STAGE;  Surgeon: Martina Sinner, MD;  Location: [facility];  Service: Urology;   Laterality: N/A;   MOUTH SURGERY  02/16/2020   REPAIR RECURRENT RIGHT INGUINAL HERNIA  2000   RIGHT INGUINAL HERNIA REPAIR  1997   UMBILICAL HERNIA REPAIR  2010    Family History  Problem Relation Age of Onset   Other Mother        colon resection, tumor near liver   Diabetes Mother    Hyperlipidemia Mother    Hypertension Mother    Varicose Veins Mother    Heart Problems Father    Diabetes Father    Heart disease Father    Hyperlipidemia Father    Hypertension Father    Pancreatic  cancer Neg Hx    Rectal cancer Neg Hx    Stomach cancer Neg Hx    Colon cancer Neg Hx    Social History   Socioeconomic History   Marital status: Significant Other    Spouse name: [name]   Number of children: 1   Years of education: College   Highest education level: Not on file  Occupational History    Employer: OTHER    Comment: disability  Tobacco Use   Smoking status: Former    Current packs/day: 0.00    Average packs/day: 1 pack/day for 30.0 years (30.0 ttl pk-yrs)    Types: Cigarettes    Start date: 06/11/1980    Quit date: 06/11/2010    Years since quitting: 13.2   Smokeless tobacco: Never  Vaping Use   Vaping status: Some Days  Substance and Sexual Activity   Alcohol use: Yes    Alcohol/week: 2.0 standard drinks of alcohol    Types: 2 Cans of beer per week    Comment: rarely   Drug use: No   Sexual activity: Not Currently    Partners: Male  Other Topics Concern   Not on file  Social History Narrative   Patient lives at home with partner.   Caffeine Use: 32oz daily coffee/tea      Disability.    Social Determinants of Health   Financial Resource Strain: Low Risk  (06/18/2023)   Overall Financial Resource Strain (CARDIA)    Difficulty of Paying Living Expenses: Not hard at all  Food Insecurity: No Food Insecurity (06/18/2023)   Hunger Vital Sign    Worried About Running Out of Food in the Last Year: Never true    Ran Out of Food in the Last Year: Never true   Transportation Needs: No Transportation Needs (06/18/2023)   PRAPARE - Administrator, Civil Service (Medical): No    Lack of Transportation (Non-Medical): No  Physical Activity: Sufficiently Active (04/23/2023)   Exercise Vital Sign    Days of Exercise per Week: 5 days    Minutes of Exercise per Session: 30 min  Stress: No Stress Concern Present (06/18/2023)   [employer] of Occupational Health - Occupational Stress Questionnaire    Feeling of Stress : Only a little  Social Connections: Moderately Isolated (05/26/2023)   Social Connection and Isolation Panel [NHANES]    Frequency of Communication with Friends and Family: Twice a week    Frequency of Social Gatherings with Friends and Family: Twice a week    Attends Religious Services: Never    Diplomatic Services operational officer: No    Attends Engineer, structural: Never    Marital Status: Living with partner    Objective:  BP 138/60   Pulse (!) 58   Temp (!) 97.2 F (36.2 C)   Resp 16   Ht 5\' 10"  (1.778 m)   Wt 203 lb (92.1 kg)   BMI 29.13 kg/m      09/07/2023    1:41 PM 09/07/2023    1:27 PM 07/15/2023    2:48 PM  BP/Weight  Systolic BP 138 150 120  Diastolic BP 60 60 82  Wt. (Lbs)  203 200.4  BMI  29.13 kg/m2 28.75 kg/m2    Physical Exam Vitals reviewed.  Constitutional:      Appearance: Normal appearance.  Neck:     Vascular: No carotid bruit.  Cardiovascular:     Rate and Rhythm: Normal rate and regular rhythm.  Pulses: Normal pulses.     Heart sounds: Normal heart sounds.  Pulmonary:     Effort: Pulmonary effort is normal.     Breath sounds: Normal breath sounds. No wheezing, rhonchi or rales.  Abdominal:     General: Bowel sounds are normal.     Palpations: Abdomen is soft.     Tenderness: There is no abdominal tenderness.  Neurological:     Mental Status: He is alert and oriented to person, place, and time.  Psychiatric:        Mood and Affect: Mood normal.         Behavior: Behavior normal.     Diabetic Foot Exam - Simple   No data filed      Lab Results  Component Value Date   WBC 6.8 09/07/2023   HGB 14.6 09/07/2023   HCT 43.4 09/07/2023   PLT 220 09/07/2023   GLUCOSE 81 09/07/2023   CHOL 174 09/07/2023   TRIG 77 09/07/2023   HDL 54 09/07/2023   LDLCALC 106 (H) 09/07/2023   ALT 12 09/07/2023   AST 20 09/07/2023   NA 143 09/07/2023   K 5.1 09/07/2023   CL 106 09/07/2023   CREATININE 1.53 (H) 09/07/2023   BUN 21 09/07/2023   CO2 24 09/07/2023      Assessment & Plan:    Mixed hyperlipidemia Assessment & Plan: Well controlled.  No changes to medicines. Lipitor 20 mg nightly.  Continue to work on eating a healthy diet and exercise.  Labs drawn today.    Orders: -     Lipid panel  Primary hypertension Assessment & Plan: controlled Continue taking hydrochlorothiazide 25 mg at bedtime Monitor bp at home and keep bp log. Labs drawn today  Orders: -     CBC with Differential/Platelet -     Comprehensive metabolic panel  Encounter for hepatitis C virus screening test for high risk patient -     [facility] SCREENING LAB  Encounter for immunization -     Influenza, MDCK, trivalent, PF(Flucelvax egg-free)  Complex regional pain syndrome type 1 of right lower extremity Assessment & Plan: Fair management. On oxycodone 20 mg 4 times daily as needed.   Gastroesophageal reflux disease with esophagitis without hemorrhage Assessment & Plan: The current medical regimen is effective;  continue present plan and medications. Continue nexium 40 mg twice daily.    Chronic narcotic dependence (HCC) Assessment & Plan: Due to RPS   Asymptomatic HIV infection, with no history of HIV-related illness [facility]) Assessment & Plan: Management per specialist.     Chronic constipation Assessment & Plan: The current medical regimen is effective;  continue present plan and medications. continue linzess 72 mcg once daily.         No orders of the defined types were placed in this encounter.   Orders Placed This Encounter  Procedures   Influenza, MDCK, trivalent, PF(Flucelvax egg-free)   [facility] HEPATITIS C SCREENING LAB   CBC with Differential/Platelet   Comprehensive metabolic panel   Lipid panel     Follow-up: Return in about 3 months (around 12/08/2023) for chronic follow up.   I,Carolyn M Morrison,acting as a Neurosurgeon for Blane Ohara, MD.,have documented all relevant documentation on the behalf of Blane Ohara, MD,as directed by  Blane Ohara, MD while in the presence of Blane Ohara, MD.   Clayborn Bigness I Leal-Borjas,acting as a scribe for Blane Ohara, MD.,have documented all relevant documentation on the behalf of Blane Ohara, MD,as directed by  Blane Ohara, MD while in the presence of Blane Ohara, MD.    An After Visit Summary was printed and given to the patient.  I attest that I have reviewed this visit and agree with the plan scribed by my staff.   Blane Ohara, MD [facility] [phone]

## 2023-09-08 ENCOUNTER — Other Ambulatory Visit: Payer: Self-pay

## 2023-09-08 DIAGNOSIS — N289 Disorder of kidney and ureter, unspecified: Secondary | ICD-10-CM

## 2023-09-08 LAB — CBC WITH DIFFERENTIAL/PLATELET
Basophils Absolute: 0.1 10*3/uL (ref 0.0–0.2)
Basos: 1 %
EOS (ABSOLUTE): 0.1 10*3/uL (ref 0.0–0.4)
Eos: 2 %
Hematocrit: 43.4 % (ref 37.5–51.0)
Hemoglobin: 14.6 g/dL (ref 13.0–17.7)
Immature Grans (Abs): 0 10*3/uL (ref 0.0–0.1)
Immature Granulocytes: 0 %
Lymphocytes Absolute: 2.7 10*3/uL (ref 0.7–3.1)
Lymphs: 40 %
MCH: 32 pg (ref 26.6–33.0)
MCHC: 33.6 g/dL (ref 31.5–35.7)
MCV: 95 fL (ref 79–97)
Monocytes Absolute: 0.8 10*3/uL (ref 0.1–0.9)
Monocytes: 12 %
Neutrophils Absolute: 3.1 10*3/uL (ref 1.4–7.0)
Neutrophils: 45 %
Platelets: 220 10*3/uL (ref 150–450)
RBC: 4.56 x10E6/uL (ref 4.14–5.80)
RDW: 11.9 % (ref 11.6–15.4)
WBC: 6.8 10*3/uL (ref 3.4–10.8)

## 2023-09-08 LAB — COMPREHENSIVE METABOLIC PANEL
ALT: 12 [IU]/L (ref 0–44)
AST: 20 [IU]/L (ref 0–40)
Albumin: 4.2 g/dL (ref 3.8–4.9)
Alkaline Phosphatase: 87 [IU]/L (ref 44–121)
BUN/Creatinine Ratio: 14 (ref 9–20)
BUN: 21 mg/dL (ref 6–24)
Bilirubin Total: 0.5 mg/dL (ref 0.0–1.2)
CO2: 24 mmol/L (ref 20–29)
Calcium: 9.1 mg/dL (ref 8.7–10.2)
Chloride: 106 mmol/L (ref 96–106)
Creatinine, Ser: 1.53 mg/dL — ABNORMAL HIGH (ref 0.76–1.27)
Globulin, Total: 2.2 g/dL (ref 1.5–4.5)
Glucose: 81 mg/dL (ref 70–99)
Potassium: 5.1 mmol/L (ref 3.5–5.2)
Sodium: 143 mmol/L (ref 134–144)
Total Protein: 6.4 g/dL (ref 6.0–8.5)
eGFR: 52 mL/min/{1.73_m2} — ABNORMAL LOW (ref >59)

## 2023-09-08 LAB — LIPID PANEL
Chol/HDL Ratio: 3.2 {ratio} (ref 0.0–5.0)
Cholesterol, Total: 174 mg/dL (ref 100–199)
HDL: 54 mg/dL (ref >39)
LDL Chol Calc (NIH): 106 mg/dL — ABNORMAL HIGH (ref 0–99)
Triglycerides: 77 mg/dL (ref 0–149)
VLDL Cholesterol Cal: 14 mg/dL (ref 5–40)

## 2023-09-09 ENCOUNTER — Encounter: Payer: Self-pay | Admitting: *Deleted

## 2023-09-09 ENCOUNTER — Telehealth: Payer: Self-pay | Admitting: *Deleted

## 2023-09-09 NOTE — Patient Outreach (Signed)
Care Coordination   09/09/2023 Name: Kevin Pena MRN: [id number] DOB: Mar 26, 1966   Care Coordination Outreach Attempts:  An unsuccessful telephone outreach was attempted today to offer the patient information about available care coordination services.  Follow Up Plan:  Additional outreach attempts will be made to offer the patient care coordination information and services.   Encounter Outcome:  No Answer   Care Coordination Interventions:  No, not indicated    Reece Levy, MSW, LCSW Clinical Social Worker [phone]

## 2023-09-12 NOTE — Assessment & Plan Note (Signed)
controlled Continue taking hydrochlorothiazide 25 mg at bedtime Monitor bp at home and keep bp log. Labs drawn today

## 2023-09-12 NOTE — Assessment & Plan Note (Signed)
Well controlled.  No changes to medicines. Lipitor 20 mg nightly Continue to work on eating a healthy diet and exercise.  Labs drawn today.

## 2023-09-13 ENCOUNTER — Encounter: Payer: Self-pay | Admitting: Family Medicine

## 2023-09-13 NOTE — Assessment & Plan Note (Signed)
Fair management. On oxycodone 20 mg 4 times daily as needed.

## 2023-09-13 NOTE — Assessment & Plan Note (Signed)
The current medical regimen is effective;  continue present plan and medications. Continue nexium 40 mg twice daily.  

## 2023-09-13 NOTE — Assessment & Plan Note (Signed)
Due to RPS

## 2023-09-13 NOTE — Assessment & Plan Note (Signed)
The current medical regimen is effective;  continue present plan and medications.continue linzess 72 mcg once daily.

## 2023-09-13 NOTE — Assessment & Plan Note (Signed)
Management per specialist. 

## 2023-09-18 ENCOUNTER — Other Ambulatory Visit: Payer: Self-pay | Admitting: Family Medicine

## 2023-09-20 NOTE — Addendum Note (Signed)
Addended by: Tawny Asal I on: 09/20/2023 09:27 PM   Modules accepted: Orders

## 2023-09-22 ENCOUNTER — Other Ambulatory Visit: Payer: Self-pay | Admitting: Family Medicine

## 2023-09-22 ENCOUNTER — Other Ambulatory Visit: Payer: Medicare HMO

## 2023-09-22 DIAGNOSIS — N289 Disorder of kidney and ureter, unspecified: Secondary | ICD-10-CM

## 2023-09-22 LAB — CMP14+EGFR
ALT: 12 [IU]/L (ref 0–44)
AST: 18 [IU]/L (ref 0–40)
Albumin: 4.2 g/dL (ref 3.8–4.9)
Alkaline Phosphatase: 79 [IU]/L (ref 44–121)
BUN/Creatinine Ratio: 17 (ref 9–20)
BUN: 25 mg/dL — ABNORMAL HIGH (ref 6–24)
Bilirubin Total: 0.2 mg/dL (ref 0.0–1.2)
CO2: 22 mmol/L (ref 20–29)
Calcium: 9.1 mg/dL (ref 8.7–10.2)
Chloride: 104 mmol/L (ref 96–106)
Creatinine, Ser: 1.46 mg/dL — ABNORMAL HIGH (ref 0.76–1.27)
Globulin, Total: 2 g/dL (ref 1.5–4.5)
Glucose: 91 mg/dL (ref 70–99)
Potassium: 4.4 mmol/L (ref 3.5–5.2)
Sodium: 140 mmol/L (ref 134–144)
Total Protein: 6.2 g/dL (ref 6.0–8.5)
eGFR: 55 mL/min/{1.73_m2} — ABNORMAL LOW (ref >59)

## 2023-10-01 ENCOUNTER — Ambulatory Visit: Payer: Self-pay | Admitting: *Deleted

## 2023-10-01 NOTE — Patient Outreach (Signed)
  Care Coordination   Follow Up Visit Note   10/01/2023 Name: Kevin Pena MRN: [id number] DOB: 1966-09-09  Kevin Pena is a 57 y.o. year old male who sees Cox, Kirsten, MD for primary care. I spoke with  Kevin Pena by phone today.  What matters to the patients health and wellness today?  Feeling tired, trying to eat healthy and coping well.    Goals Addressed             This Visit's Progress    COMPLETED: Reduce anxiety and complete Advance Directives       Activities and task to complete in order to accomplish goals.   Discuss sleeping issues with PCP Continue relaxed breathing 3 times daily Reschedule and discuss with PCP and/or other Providers sleep issues and possible OTC and RX options Keep all upcoming appointment discussed today Continue with compliance of taking medication prescribed by Doctor Review your EMMI educational information   Look for an e-mail from [facility]. Review and consider completion of  Advance Directive packet- mailed to you   Have advance directive notarized and provide a copy to provider office  Find ways to reduce your worry and anxiety-  Call [phone] for mental health crisis line (24/7)    ,          SDOH assessments and interventions completed:  Yes     Care Coordination Interventions:  Yes, provided  Interventions Today    Flowsheet Row Most Recent Value  Chronic Disease   Chronic disease during today's visit Other  [innsomnia-Sleep about 3-4 hours per night]  Mental Health Interventions   Mental Health Discussed/Reviewed Mental Health Discussed, Grief and Loss, Mental Health Reviewed, Coping Strategies, Anxiety  [appropriately grieving family deaths- " a blessing for them". Worry about my health- labwork coming up 10/06/23.]       Follow up plan: No further intervention required.   Encounter Outcome:  Patient Visit Completed

## 2023-10-02 ENCOUNTER — Telehealth: Payer: Self-pay | Admitting: Family Medicine

## 2023-10-02 ENCOUNTER — Other Ambulatory Visit: Payer: Self-pay | Admitting: Family Medicine

## 2023-10-02 MED ORDER — OXYCODONE HCL 20 MG PO TABS: 20.0000 mg | ORAL_TABLET | Freq: Four times a day (QID) | ORAL | 0 refills | Status: DC | PRN

## 2023-10-02 NOTE — Telephone Encounter (Signed)
Prescription was refill today.

## 2023-10-02 NOTE — Telephone Encounter (Signed)
Prescription Request  10/02/2023  LOV: 09/07/2023  What is the name of the medication or equipment? Oxycodone HCl 20 MG TABS [[id number]]     Which pharmacy would you like this sent to?  [id number] [place], [place] - [address] AT [facility] [address] [place] [address] Phone: [phone] Fax: [phone]    Patient notified that their request is being sent to the clinical staff for review and that they should receive a response within 2 business days.   Please advise at Mobile [phone] (mobile)

## 2023-10-05 ENCOUNTER — Other Ambulatory Visit: Payer: Self-pay

## 2023-10-05 DIAGNOSIS — N289 Disorder of kidney and ureter, unspecified: Secondary | ICD-10-CM

## 2023-10-06 ENCOUNTER — Other Ambulatory Visit: Payer: Medicare HMO

## 2023-10-06 DIAGNOSIS — N289 Disorder of kidney and ureter, unspecified: Secondary | ICD-10-CM

## 2023-10-07 LAB — COMPREHENSIVE METABOLIC PANEL
ALT: 10 [IU]/L (ref 0–44)
AST: 18 [IU]/L (ref 0–40)
Albumin: 4.3 g/dL (ref 3.8–4.9)
Alkaline Phosphatase: 80 [IU]/L (ref 44–121)
BUN/Creatinine Ratio: 14 (ref 9–20)
BUN: 21 mg/dL (ref 6–24)
Bilirubin Total: 0.4 mg/dL (ref 0.0–1.2)
CO2: 24 mmol/L (ref 20–29)
Calcium: 9.2 mg/dL (ref 8.7–10.2)
Chloride: 103 mmol/L (ref 96–106)
Creatinine, Ser: 1.45 mg/dL — ABNORMAL HIGH (ref 0.76–1.27)
Globulin, Total: 2.1 g/dL (ref 1.5–4.5)
Glucose: 78 mg/dL (ref 70–99)
Potassium: 4.4 mmol/L (ref 3.5–5.2)
Sodium: 142 mmol/L (ref 134–144)
Total Protein: 6.4 g/dL (ref 6.0–8.5)
eGFR: 56 mL/min/{1.73_m2} — ABNORMAL LOW (ref >59)

## 2023-10-08 ENCOUNTER — Other Ambulatory Visit: Payer: Self-pay

## 2023-10-08 DIAGNOSIS — N289 Disorder of kidney and ureter, unspecified: Secondary | ICD-10-CM

## 2023-10-15 ENCOUNTER — Other Ambulatory Visit: Payer: Medicare HMO

## 2023-10-15 ENCOUNTER — Other Ambulatory Visit: Payer: Self-pay | Admitting: Family Medicine

## 2023-10-15 DIAGNOSIS — N289 Disorder of kidney and ureter, unspecified: Secondary | ICD-10-CM | POA: Diagnosis not present

## 2023-10-15 LAB — COMPREHENSIVE METABOLIC PANEL
ALT: 11 [IU]/L (ref 0–44)
AST: 16 [IU]/L (ref 0–40)
Albumin: 3.7 g/dL — ABNORMAL LOW (ref 3.8–4.9)
Alkaline Phosphatase: 76 [IU]/L (ref 44–121)
BUN/Creatinine Ratio: 12 (ref 9–20)
BUN: 17 mg/dL (ref 6–24)
Bilirubin Total: 0.3 mg/dL (ref 0.0–1.2)
CO2: 24 mmol/L (ref 20–29)
Calcium: 8.7 mg/dL (ref 8.7–10.2)
Chloride: 104 mmol/L (ref 96–106)
Creatinine, Ser: 1.38 mg/dL — ABNORMAL HIGH (ref 0.76–1.27)
Globulin, Total: 2.1 g/dL (ref 1.5–4.5)
Glucose: 92 mg/dL (ref 70–99)
Potassium: 4.7 mmol/L (ref 3.5–5.2)
Sodium: 139 mmol/L (ref 134–144)
Total Protein: 5.8 g/dL — ABNORMAL LOW (ref 6.0–8.5)
eGFR: 59 mL/min/{1.73_m2} — ABNORMAL LOW (ref >59)

## 2023-10-29 ENCOUNTER — Other Ambulatory Visit: Payer: Self-pay

## 2023-10-29 NOTE — Telephone Encounter (Signed)
Copied from CRM [id number]. Topic: Clinical - Medication Refill >> Oct 29, 2023  9:11 AM Fonda Kinder J wrote: Most Recent Primary Care Visit:  Provider: [facility]  Department: [facility]  Visit Type: LAB  Date: 10/15/2023  Medication: Oxycodone HCl 20 MG TAB  Has the patient contacted their pharmacy? Yes (Agent: If no, request that the patient contact the pharmacy for the refill. If patient does not wish to contact the pharmacy document the reason why and proceed with request.) (Agent: If yes, when and what did the pharmacy advise?) Contact PCP  Is this the correct pharmacy for this prescription? Yes If no, delete pharmacy and type the correct one.  This is the patient's preferred pharmacy:  [id number] - [place], [place] - [address] AT [facility] [address] [place] [address] Phone: [phone] Fax: [phone]   Has the prescription been filled recently? No  Is the patient out of the medication? Yes  Has the patient been seen for an appointment in the last year OR does the patient have an upcoming appointment? Yes  Can we respond through MyChart? No  Agent: Please be advised that Rx refills may take up to 3 business days. We ask that you follow-up with your pharmacy.

## 2023-10-31 ENCOUNTER — Encounter: Payer: Self-pay | Admitting: Family Medicine

## 2023-11-01 ENCOUNTER — Other Ambulatory Visit: Payer: Self-pay

## 2023-11-02 ENCOUNTER — Other Ambulatory Visit: Payer: Self-pay

## 2023-11-02 MED ORDER — OXYCODONE HCL 20 MG PO TABS: 20.0000 mg | ORAL_TABLET | Freq: Four times a day (QID) | ORAL | 0 refills | Status: DC | PRN

## 2023-11-03 DIAGNOSIS — N130 Hydronephrosis with ureteropelvic junction obstruction: Secondary | ICD-10-CM | POA: Diagnosis not present

## 2023-11-03 DIAGNOSIS — N3941 Urge incontinence: Secondary | ICD-10-CM | POA: Diagnosis not present

## 2023-11-11 ENCOUNTER — Encounter (HOSPITAL_COMMUNITY): Payer: Self-pay

## 2023-11-11 ENCOUNTER — Emergency Department (HOSPITAL_COMMUNITY): Payer: Medicare HMO

## 2023-11-11 ENCOUNTER — Other Ambulatory Visit: Payer: Self-pay

## 2023-11-11 ENCOUNTER — Ambulatory Visit: Payer: Self-pay | Admitting: Family Medicine

## 2023-11-11 ENCOUNTER — Emergency Department (HOSPITAL_COMMUNITY)
Admission: EM | Admit: 2023-11-11 | Discharge: 2023-11-11 | Disposition: A | Discharge status: Home / Self Care | Payer: Medicare HMO

## 2023-11-11 DIAGNOSIS — G8929 Other chronic pain: Secondary | ICD-10-CM

## 2023-11-11 DIAGNOSIS — M546 Pain in thoracic spine: Secondary | ICD-10-CM | POA: Diagnosis not present

## 2023-11-11 DIAGNOSIS — Z79899 Other long term (current) drug therapy: Secondary | ICD-10-CM | POA: Diagnosis not present

## 2023-11-11 DIAGNOSIS — R079 Chest pain, unspecified: Secondary | ICD-10-CM | POA: Diagnosis not present

## 2023-11-11 DIAGNOSIS — M545 Low back pain, unspecified: Secondary | ICD-10-CM | POA: Diagnosis not present

## 2023-11-11 LAB — CBC
HCT: 43.9 % (ref 39.0–52.0)
Hemoglobin: 15.1 g/dL (ref 13.0–17.0)
MCH: 32.9 pg (ref 26.0–34.0)
MCHC: 34.4 g/dL (ref 30.0–36.0)
MCV: 95.6 fL (ref 80.0–100.0)
Platelets: 194 10*3/uL (ref 150–400)
RBC: 4.59 MIL/uL (ref 4.22–5.81)
RDW: 13 % (ref 11.5–15.5)
WBC: 7.8 10*3/uL (ref 4.0–10.5)
nRBC: 0 % (ref 0.0–0.2)

## 2023-11-11 LAB — BASIC METABOLIC PANEL
Anion gap: 10 (ref 5–15)
BUN: 18 mg/dL (ref 6–20)
CO2: 23 mmol/L (ref 22–32)
Calcium: 8.8 mg/dL — ABNORMAL LOW (ref 8.9–10.3)
Chloride: 106 mmol/L (ref 98–111)
Creatinine, Ser: 1.19 mg/dL (ref 0.61–1.24)
GFR, Estimated: >60 mL/min (ref >60)
Glucose, Bld: 97 mg/dL (ref 70–99)
Potassium: 4.3 mmol/L (ref 3.5–5.1)
Sodium: 139 mmol/L (ref 135–145)

## 2023-11-11 LAB — URINALYSIS, W/ REFLEX TO CULTURE (INFECTION SUSPECTED)
Bacteria, UA: NONE SEEN
Bilirubin Urine: NEGATIVE
Glucose, UA: NEGATIVE mg/dL
Hgb urine dipstick: NEGATIVE
Ketones, ur: NEGATIVE mg/dL
Leukocytes,Ua: NEGATIVE
Nitrite: NEGATIVE
Protein, ur: NEGATIVE mg/dL
Specific Gravity, Urine: 1.018 (ref 1.005–1.030)
pH: 5 (ref 5.0–8.0)

## 2023-11-11 LAB — TROPONIN I (HIGH SENSITIVITY): Troponin I (High Sensitivity): 4 ng/L (ref <18)

## 2023-11-11 MED ORDER — METHOCARBAMOL 500 MG PO TABS: 500.0000 mg | ORAL_TABLET | Freq: Three times a day (TID) | ORAL | 0 refills | Status: AC | PRN

## 2023-11-11 NOTE — ED Notes (Signed)
Urine sent to main lab.

## 2023-11-11 NOTE — Telephone Encounter (Signed)
Copied from CRM [id number]. Topic: Clinical - Red Word Triage >> Nov 11, 2023 11:03 AM Carlatta H wrote: Red Word that prompted transfer to Nurse Triage: Patient called in stating he has now starting to have sever pain in both side of back and its moving to his ribs.   Chief Complaint: Flank pain radiating to ribcage and chest Symptoms: Constant pain in back, chest, and ribcage and nausea Frequency: Started 1 week ago, constant Pertinent Negatives: Patient denies SOB and fever Disposition: [x] ED /[] Urgent Care (no appt availability in office) / [] Appointment(In office/virtual)/ []  [facility]/ [] Home Care/ [] Refused Recommended Disposition /[] [facility]/ []  Follow-up with PCP Additional Notes: Patient called in complaining of severe pain in his back/flank area that has started radiating to his ribcage and chest. Nausea is also present. Patient has a history of kidney issues, but pain in his ribcage and chest has been a new onset over the past week. Patient denies SOB and fever. Patient claims he is able to take deep breaths. Patient describes the pain as intense and constant. Patient also disclosed that he is experiencing arm numbness when he wakes up. Patient advised to go to the ED to rule out a cardiac event. Patient has an appointment this afternoon and stated that he would go to the ED after it, but I strongly advised him to go to the ED as soon as possible. Patient was receptive to this recommendation.   Reason for Disposition  Patient sounds very sick or weak to the triager  Answer Assessment - Initial Assessment Questions 1. LOCATION: "Where does it hurt?" (e.g., left, right)     Started in the back/flank area and moving towards the front around ribcage 2. ONSET: "When did the pain start?"     About a week 3. SEVERITY: "How bad is the pain?" (e.g., Scale 1-10; mild, moderate, or severe)   - MILD (1-3): doesn't interfere with normal activities    - MODERATE (4-7):  interferes with normal activities or awakens from sleep    - SEVERE (8-10): excruciating pain and patient unable to do normal activities (stays in bed)       6-7 4. PATTERN: "Does the pain come and go, or is it constant?"      Constant 5. CAUSE: "What do you think is causing the pain?"     Patient believes that his kidneys are causing the pain 6. OTHER SYMPTOMS:  "Do you have any other symptoms?" (e.g., fever, abdomen pain, vomiting, leg weakness, burning with urination, blood in urine)     Nausea is present, denies fever and SOB  Protocols used: Flank Pain-A-AH

## 2023-11-11 NOTE — Discharge Instructions (Signed)
Follow-up with primary doctor.  Return immediately for fevers, chills, chest pain, shortness of breath, inability to urinate, inability to walk due to weakness, numbness in your genital area or you develop any new or worsening symptoms that are concerning to you.

## 2023-11-11 NOTE — ED Triage Notes (Signed)
Pt arrived with mid back pain that radiates to chest. States he has some kidney lab abnormality. Denies any shob,dizziness other symptoms.

## 2023-11-11 NOTE — ED Provider Notes (Signed)
[place] EMERGENCY DEPARTMENT AT [facility] Provider Note   CSN: [id number] Arrival date & time: 11/11/23  1513     History  Chief Complaint  Patient presents with   Back Pain    Kevin Pena is a 57 y.o. male.  This is a 57 year old male presenting to the emergency department for thoracic back pain.  History of the same x 3 months reports worsening symptoms over the past 1 month.  No trauma no unexplained weight loss no neurologic symptoms no immunosuppression.  Notes pain bilateral near kidneys wraps around to front of chest.  No shortness of breath.   Back Pain      Home Medications Prior to Admission medications   Medication Sig Start Date End Date Taking? Authorizing Provider  albuterol (VENTOLIN HFA) 108 (90 Base) MCG/ACT inhaler TAKE 2 PUFFS BY MOUTH EVERY 6 HOURS AS NEEDED FOR WHEEZE OR SHORTNESS OF BREATH 09/04/23   Cox, Kirsten, MD  atorvastatin (LIPITOR) 20 MG tablet TAKE 1 TABLET BY MOUTH EVERY DAY 09/18/23   Cox, Kirsten, MD  esomeprazole (NEXIUM) 40 MG capsule TAKE 1 CAPSULE BY MOUTH TWICE A DAY 06/24/23   Cox, Kirsten, MD  fluticasone (FLONASE) 50 MCG/ACT nasal spray SPRAY 1 SPRAY INTO EACH NOSTRIL EVERY DAY AS NEEDED FOR ALLERGIES 09/22/23   Cox, Kirsten, MD  hydrochlorothiazide (HYDRODIURIL) 25 MG tablet TAKE 1 TABLET BY MOUTH EVERY EVENING 08/28/23   Sirivol, Kristie Cowman, MD  linaclotide (LINZESS) 72 MCG capsule TAKE 1 CAPSULE BY MOUTH DAILY BEFORE BREAKFAST. 05/05/23   Marianne Sofia, PA-C  ODEFSEY 200-25-25 MG TABS tablet Take 1 tablet by mouth every morning. 02/16/20   [provider]  Oxycodone HCl 20 MG TABS Take 1 tablet (20 mg total) by mouth 4 (four) times daily as needed. 11/02/23   Cox, Fritzi Mandes, MD  promethazine (PHENERGAN) 25 MG tablet TAKE 1 TABLET BY MOUTH EVERY 8 HOURS AS NEEDED FOR NAUSEA OR VOMITING. 05/05/23   Marianne Sofia, PA-C  terbinafine (LAMISIL) 250 MG tablet Take 1 tablet (250 mg total) by mouth daily. 07/29/23   McCaughan,  Dia D, DPM  valACYclovir (VALTREX) 500 MG tablet Take 1 tablet by mouth daily as needed (outbreaks).  09/14/14   [provider]      Allergies    Adhesive [tape], Ampicillin, Doxycycline, Hydroxyzine, and Lyrica [pregabalin]    Review of Systems   Review of Systems  Musculoskeletal:  Positive for back pain.    Physical Exam Updated Vital Signs BP 131/65 (BP Location: Left Arm)   Pulse 60   Temp 98.3 F (36.8 C) (Oral)   Resp 18   SpO2 99%  Physical Exam Vitals and nursing note reviewed.  Constitutional:      General: He is not in acute distress.    Appearance: He is not toxic-appearing.  HENT:     Head: Normocephalic.     Nose: Nose normal.  Eyes:     Conjunctiva/sclera: Conjunctivae normal.  Cardiovascular:     Rate and Rhythm: Normal rate.  Pulmonary:     Effort: Pulmonary effort is normal.     Breath sounds: Normal breath sounds.  Abdominal:     General: Abdomen is flat. There is no distension.     Tenderness: There is no abdominal tenderness. There is no guarding or rebound.  Musculoskeletal:        General: Normal range of motion.  Skin:    General: Skin is warm.     Capillary Refill: Capillary refill takes less  than 2 seconds.  Neurological:     Mental Status: He is alert and oriented to person, place, and time.  Psychiatric:        Mood and Affect: Mood normal.        Behavior: Behavior normal.     ED Results / Procedures / Treatments   Labs (all labs ordered are listed, but only abnormal results are displayed) Labs Reviewed  BASIC METABOLIC PANEL - Abnormal; Notable for the following components:      Result Value   Calcium 8.8 (*)    All other components within normal limits  URINALYSIS, W/ REFLEX TO CULTURE (INFECTION SUSPECTED) - Abnormal; Notable for the following components:   Color, Urine AMBER (*)    APPearance TURBID (*)    All other components within normal limits  CBC  TROPONIN I (HIGH SENSITIVITY)     EKG None  Radiology CT Thoracic Spine Wo Contrast Result Date: 11/11/2023 CLINICAL DATA:  Mid back pain, compression fracture suspected EXAM: CT THORACIC SPINE WITHOUT CONTRAST TECHNIQUE: Multidetector CT images of the thoracic were obtained using the standard protocol without intravenous contrast. RADIATION DOSE REDUCTION: This exam was performed according to the departmental dose-optimization program which includes automated exposure control, adjustment of the mA and/or kV according to patient size and/or use of iterative reconstruction technique. COMPARISON:  No prior CT of the thoracic spine available. Correlation is made with 12/15/2018 chest radiograph and 09/20/2014 CT chest FINDINGS: Alignment: No listhesis. Vertebrae: No acute fracture or focal pathologic process. Paraspinal and other soft tissues: Mild aortic atherosclerosis. No focal pulmonary opacity or pleural effusion in the imaged lungs. Disc levels: No significant spinal canal stenosis or neural foraminal narrowing. IMPRESSION: No acute fracture or traumatic listhesis in the thoracic spine. Electronically Signed   By: Wiliam Ke M.D.   On: 11/11/2023 18:55   DG Chest 2 View Result Date: 11/11/2023 CLINICAL DATA:  Chest pain. EXAM: CHEST - 2 VIEW COMPARISON:  12/15/2018. FINDINGS: Redemonstration of a 7 mm calcified granuloma overlying the right mid lung zone, unchanged. Bilateral lung fields are otherwise clear. No acute consolidation or lung collapse. Bilateral costophrenic angles are clear. Normal cardio-mediastinal silhouette. No acute osseous abnormalities. The soft tissues are within normal limits. There are surgical clips in the right upper quadrant, typical of a previous cholecystectomy. IMPRESSION: *No active cardiopulmonary disease. Electronically Signed   By: Jules Schick M.D.   On: 11/11/2023 16:27    Procedures Procedures    Medications Ordered in ED Medications - No data to display  ED Course/ Medical  Decision Making/ A&P Clinical Course as of 11/11/23 2120  Wed Nov 11, 2023  1858 CT Thoracic Spine Wo Contrast IMPRESSION: No acute fracture or traumatic listhesis in the thoracic spine   [TY]    Clinical Course User Index [TY] Coral Spikes, DO                                 Medical Decision Making Well-appearing 57 year old male presenting emergency department for acute on chronic back pain.  Thoracic.  No red flags other than midline tenderness at the level of T7.  No fever chills tachycardia to suggest systemic infection.  Other labs also reassuring.  Troponin negative.  EKG without ST segment changes to indicate ischemia.  ACS unlikely.  Low risk for PE based on Wells criteria.  No significant metabolic derangement.  UA normal.  Patient stable for discharge to  follow-up with primary doctor.  Amount and/or Complexity of Data Reviewed External Data Reviewed:     Details: Takes oxycodone chronically Labs: ordered. Decision-making details documented in ED Course. Radiology: ordered. Decision-making details documented in ED Course.  Risk Prescription drug management. Decision regarding hospitalization.         Final Clinical Impression(s) / ED Diagnoses Final diagnoses:  None    Rx / DC Orders ED Discharge Orders     None         Coral Spikes, DO 11/11/23 2120

## 2023-11-11 NOTE — Telephone Encounter (Signed)
Agree. Dr. Dina Mobley  

## 2023-11-23 DIAGNOSIS — N130 Hydronephrosis with ureteropelvic junction obstruction: Secondary | ICD-10-CM | POA: Diagnosis not present

## 2023-11-24 ENCOUNTER — Telehealth: Payer: Self-pay | Admitting: *Deleted

## 2023-11-24 NOTE — Progress Notes (Signed)
Transition Care Management Unsuccessful Follow-up Telephone Call  Date of discharge and from where:  [facility]  11/11/2023  Attempts:  1st Attempt  Reason for unsuccessful TCM follow-up call:  No answer/busy

## 2023-11-30 ENCOUNTER — Other Ambulatory Visit: Payer: Self-pay

## 2023-11-30 MED ORDER — OXYCODONE HCL 20 MG PO TABS: 20.0000 mg | ORAL_TABLET | Freq: Four times a day (QID) | ORAL | 0 refills | Status: DC | PRN

## 2023-11-30 NOTE — Telephone Encounter (Signed)
 Copied from CRM [id number]. Topic: Clinical - Medication Refill >> Nov 30, 2023 12:14 PM Laurier BROCKS wrote: Most Recent Primary Care Visit:  Provider: [facility]  Department: [facility]  Visit Type: LAB  Date: 10/15/2023  Medication: Oxycodone  HCl 20 MG TABS   Has the patient contacted their pharmacy? Yes (Agent: If no, request that the patient contact the pharmacy for the refill. If patient does not wish to contact the pharmacy document the reason why and proceed with request.) (Agent: If yes, when and what did the pharmacy advise?)  Is this the correct pharmacy for this prescription? Yes If no, delete pharmacy and type the correct one.  This is the patient's preferred pharmacy:  [id number] - [place], [place] - [address] AT [facility] [address] [place] [address] Phone: [phone] Fax: [phone]   Has the prescription been filled recently? Yes  Is the patient out of the medication? Yes  Has the patient been seen for an appointment in the last year OR does the patient have an upcoming appointment? Yes  Can we respond through MyChart? Yes  Agent: Please be advised that Rx refills may take up to 3 business days. We ask that you follow-up with your pharmacy.

## 2023-12-01 NOTE — Telephone Encounter (Signed)
Called patient and left voicemail for patient to call office back.  °

## 2023-12-01 NOTE — Telephone Encounter (Signed)
 Copied from CRM [id number]. Topic: Clinical - Prescription Issue >> Dec 01, 2023 11:15 AM Adelina Mings wrote: Reason for CRM: medication was approved but insurance is not covering told him to follow up with insurance company

## 2023-12-02 ENCOUNTER — Telehealth: Payer: Self-pay

## 2023-12-02 NOTE — Telephone Encounter (Signed)
 Called patient left voicemail for patient to call office to make sure we have the right insurance information to do PA For medication

## 2023-12-02 NOTE — Telephone Encounter (Signed)
 Called patient, left message PA has been submitted to insurance and we are currently waiting on response.

## 2023-12-02 NOTE — Telephone Encounter (Signed)
 Copied from CRM [id number]. Topic: Clinical - Prescription Issue >> Dec 02, 2023  2:40 PM Suzette B wrote: Reason for CRM:  Oxycodone  HCl 20 MG TABS  Patient has requested a refill for the medication above but he stated his insurance is requesting prior authorization before it can be filled.  Please call patient at [phone]

## 2023-12-21 NOTE — Progress Notes (Unsigned)
Subjective:  Patient ID: Kevin Pena, male    DOB: [birth year]  Age: 58 y.o. MRN: [id number]  Chief Complaint  Patient presents with   Medical Management of Chronic Issues    HPI Patient is a 58 year old white male with GERD, hyperlipidemia, hypertension, chronic pain syndrome, chronic regional pain syndrome of right lower limb, chronic constipation, HIV and HSV who presents for chronic follow-up.  The patient, with a history of HIV, presents with right sided chest pain, described as 'really painful all the time.' The pain is located in the 'kidney area' and radiates through the ribcage. The pain is severe enough to cause nausea and wake the patient from sleep. Despite taking oxycodone four times a day, the pain persists. The patient has tried Tylenol, muscle relaxants, and a heating pad, but these have provided minimal relief.  The patient also reports that his bladder stimulator is 'dead' and has been for some time. He has an appointment with his urologist, Dr. Carley Hammed, on February 13th to discuss potential surgery.  In addition, the patient mentions the need for a lung cancer screening and a colonoscopy. He has a history of cancer polyps and his last colonoscopy was in 2016.  Chronic pain syndrome/regional pain syndrome: On oxycodone 20 mg 4 times daily as needed. Complex regional pain syndrome 1 of lower right leg.    Insomnia/osa: Using CPAP  HIV: On Odefsey one daily.  Per the patient his T-cell numbers are nondetectible. Sees ID doctor.  Hypertension: On hydrochlorothiazide 25 mg nightly (currently on hold due to kidney issues)  GERD: On Nexium 40 mg twice daily. If goes down to once daily, develops reflux. Well controlled at twice daily.    Constipation: On Linzess 72 mcg every morning.   Hyperlipidemia: On Lipitor 20 mg nightly.   Eats fairly healthy.      09/07/2023    1:32 PM 06/18/2023    1:27 PM 06/18/2023    1:22 PM 06/18/2023    1:20 PM 06/18/2023     1:19 PM  Depression screen PHQ 2/9  Decreased Interest 0 0 0 0 0  Down, Depressed, Hopeless 0 1 0 0 0  PHQ - 2 Score 0 1 0 0 0  Altered sleeping 2 1     Tired, decreased energy 1 0     Change in appetite 1 1     Feeling bad or failure about yourself  0 0     Trouble concentrating 0 0     Moving slowly or fidgety/restless 0 0     Suicidal thoughts 0 0     PHQ-9 Score 4 3     Difficult doing work/chores Somewhat difficult            09/07/2023    1:32 PM  Fall Risk   Falls in the past year? 0  Number falls in past yr: 0  Injury with Fall? 0  Risk for fall due to : No Fall Risks  Follow up Falls evaluation completed;Follow up appointment    Patient Care Team: Blane Ohara, MD as PCP - General (Internal Medicine) Milinda Sweeney, Janet Berlin, MD as Referring Physician (Infectious Diseases)   Review of Systems  Constitutional:  Negative for chills, diaphoresis, fatigue and fever.  HENT:  Negative for congestion, ear pain and sore throat.   Respiratory:  Negative for cough and shortness of breath.   Cardiovascular:  Negative for chest pain and leg swelling.  Gastrointestinal:  Negative for abdominal pain, constipation, diarrhea, nausea  and vomiting.  Genitourinary:  Negative for dysuria and urgency.  Musculoskeletal:  Positive for back pain. Negative for arthralgias and myalgias.  Neurological:  Negative for dizziness and headaches.  Psychiatric/Behavioral:  Negative for dysphoric mood.     Current Outpatient Medications on File Prior to Visit  Medication Sig Dispense Refill   albuterol (VENTOLIN HFA) 108 (90 Base) MCG/ACT inhaler TAKE 2 PUFFS BY MOUTH EVERY 6 HOURS AS NEEDED FOR WHEEZE OR SHORTNESS OF BREATH 17 each 3   atorvastatin (LIPITOR) 20 MG tablet TAKE 1 TABLET BY MOUTH EVERY DAY 90 tablet 1   fluticasone (FLONASE) 50 MCG/ACT nasal spray SPRAY 1 SPRAY INTO EACH NOSTRIL EVERY DAY AS NEEDED FOR ALLERGIES 48 mL 2   hydrochlorothiazide (HYDRODIURIL) 25 MG tablet TAKE 1 TABLET BY MOUTH  EVERY EVENING (Patient not taking: Reported on 12/22/2023) 90 tablet 3   linaclotide (LINZESS) 72 MCG capsule TAKE 1 CAPSULE BY MOUTH DAILY BEFORE BREAKFAST. 30 capsule 0   ODEFSEY 200-25-25 MG TABS tablet Take 1 tablet by mouth every morning.     Oxycodone HCl 20 MG TABS Take 1 tablet (20 mg total) by mouth 4 (four) times daily as needed. 120 tablet 0   promethazine (PHENERGAN) 25 MG tablet TAKE 1 TABLET BY MOUTH EVERY 8 HOURS AS NEEDED FOR NAUSEA OR VOMITING. 30 tablet 1   valACYclovir (VALTREX) 500 MG tablet Take 1 tablet by mouth daily as needed (outbreaks).      No current facility-administered medications on file prior to visit.   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   Fecal urgency 09/15/2014   GERD (gastroesophageal reflux disease)    HIV (human immunodeficiency virus infection) (HCC)    Hyperlipidemia    Hypertension 03/29/2014   Internal hemorrhoids    Left hip pain 05/18/2022   Long-term current use of opiate analgesic 03/14/2020   Lumbar pain 05/18/2022   Mixed hyperlipidemia 03/29/2014   Neuropathy 06/18/2017   Opioid use 09/14/2022   Plantar fasciitis of right foot 01/04/2016   Postoperative examination 03/10/2016   Preoperative clearance 12/24/2022   Presence of tooth-root and mandibular implants 05/29/2020   Rash left leg-- using topical cream   Spondylosis with myelopathy, 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   Thrombosed external hemorrhoid 01/28/2016   Tobacco abuse 11/06/2016   Toenail  deformity 05/31/2023   Past Surgical History:  Procedure Laterality Date   APPENDECTOMY  1981   CHOLECYSTECTOMY  2006   dental implants  11/2020   FOOT SURGERY     HAMMER TOE SURGERY  OCT 2010   INTERSTIM IMPLANT PLACEMENT  06/17/2012   Procedure: Leane Platt IMPLANT FIRST STAGE;  Surgeon: Martina Sinner, MD;  Location: [facility];  Service: Urology;  Laterality: N/A;  RAD TECH OK PER VICKIE AT MAIN OR    INTERSTIM IMPLANT PLACEMENT  06/17/2012   Procedure: INTERSTIM IMPLANT SECOND STAGE;  Surgeon: Martina Sinner, MD;  Location: [facility];  Service: Urology;  Laterality: N/A;   MOUTH SURGERY  02/16/2020   REPAIR  RECURRENT RIGHT INGUINAL HERNIA  2000   RIGHT INGUINAL HERNIA REPAIR  1997   UMBILICAL HERNIA REPAIR  2010    Family History  Problem Relation Age of Onset   Other Mother        colon resection, tumor near liver   Diabetes Mother    Hyperlipidemia Mother    Hypertension Mother    Varicose Veins Mother    Heart Problems Father    Diabetes Father    Heart disease Father    Hyperlipidemia Father    Hypertension Father    Pancreatic cancer Neg Hx    Rectal cancer Neg Hx    Stomach cancer Neg Hx    Colon cancer Neg Hx    Social History   Socioeconomic History   Marital status: Significant Other    Spouse name: [name]   Number of children: 1   Years of education: College   Highest education level: Not on file  Occupational History    Employer: OTHER    Comment: disability  Tobacco Use   Smoking status: Former    Current packs/day: 0.00    Average packs/day: 1 pack/day for 30.0 years (30.0 ttl pk-yrs)    Types: Cigarettes    Start date: 06/11/1980    Quit date: 06/11/2010    Years since quitting: 13.5   Smokeless tobacco: Never  Vaping Use   Vaping status: Some Days  Substance and Sexual Activity   Alcohol use: Yes    Alcohol/week: 2.0 standard drinks of alcohol    Types: 2 Cans of beer per week    Comment:  rarely   Drug use: No   Sexual activity: Not Currently    Partners: Male  Other Topics Concern   Not on file  Social History Narrative   Patient lives at home with partner.   Caffeine Use: 32oz daily coffee/tea      Disability.    Social Drivers of Corporate investment banker Strain: Low Risk  (06/18/2023)   Overall Financial Resource Strain (CARDIA)    Difficulty of Paying Living Expenses: Not hard at all  Food Insecurity: No Food Insecurity (06/18/2023)   Hunger Vital Sign    Worried About Running Out of Food in the Last Year: Never true    Ran Out of Food in the Last Year: Never true  Transportation Needs: No Transportation Needs (06/18/2023)   PRAPARE - Administrator, Civil Service (Medical): No    Lack of Transportation (Non-Medical): No  Physical Activity: Sufficiently Active (04/23/2023)   Exercise Vital Sign    Days of Exercise per Week: 5 days    Minutes of Exercise per Session: 30 min  Stress: No Stress Concern Present (06/18/2023)   [employer] of Occupational Health - Occupational Stress Questionnaire    Feeling of Stress : Only a little  Social Connections: Moderately Isolated (05/26/2023)   Social Connection and Isolation Panel [NHANES]    Frequency of Communication with Friends and Family: Twice a week    Frequency of Social Gatherings with Friends and Family: Twice a week    Attends Religious Services: Never    Database administrator or Organizations: No    Attends Engineer, structural: Never    Marital Status: Living with partner    Objective:  BP 124/74   Pulse 82   Temp 97.6 F (36.4 C)   Ht 5\' 10"  (1.778 m)   Wt 207 lb (93.9 kg)   SpO2  96%   BMI 29.70 kg/m      12/22/2023   10:45 AM 11/11/2023    6:34 PM 11/11/2023    3:19 PM  BP/Weight  Systolic BP 124 131 150  Diastolic BP 74 65 83  Wt. (Lbs) 207    BMI 29.7 kg/m2      Physical Exam Vitals reviewed.  Constitutional:      Appearance: Normal appearance.  Neck:      Vascular: No carotid bruit.  Cardiovascular:     Rate and Rhythm: Normal rate and regular rhythm.     Heart sounds: Normal heart sounds.  Pulmonary:     Effort: Pulmonary effort is normal.     Breath sounds: Normal breath sounds. No wheezing, rhonchi or rales.  Abdominal:     General: Bowel sounds are normal.     Palpations: Abdomen is soft.     Tenderness: There is no abdominal tenderness.  Musculoskeletal:       Arms:  Neurological:     Mental Status: He is alert.  Psychiatric:        Mood and Affect: Mood normal.        Behavior: Behavior normal.     Diabetic Foot Exam - Simple   No data filed      Lab Results  Component Value Date   WBC 7.2 12/22/2023   HGB 14.5 12/22/2023   HCT 43.9 12/22/2023   PLT 204 12/22/2023   GLUCOSE 91 12/22/2023   CHOL 166 12/22/2023   TRIG 101 12/22/2023   HDL 59 12/22/2023   LDLCALC 89 12/22/2023   ALT 12 12/22/2023   AST 17 12/22/2023   NA 141 12/22/2023   K 4.8 12/22/2023   CL 103 12/22/2023   CREATININE 1.31 (H) 12/22/2023   BUN 18 12/22/2023   CO2 26 12/22/2023     Assessment and Plan Chronic Pain Severe, constant, unilateral chest pain, worse with movement and deep breathing. No relief with Oxycodone 4 times daily or Tylenol. No relief with muscle relaxants. No associated rash, fever, cough, or shortness of breath. Pain is severe enough to cause insomnia and nausea. Physical exam suggests musculoskeletal origin. -Order non-contrast CT scan of the chest to rule out other causes. -Consider referral to physical therapy pending CT results.  Bladder Dysfunction Patient reports that bladder stimulator is no longer functioning. Appointment with urologist scheduled for 01/07/2024 to discuss surgical options. -Continue current management and follow up with urologist as planned.  General Health Maintenance -Continue current medications including Atorvastatin 20mg  at night, Nexium 40mg  twice daily, Linzess as needed for  constipation, and Odefsey for HIV management. -Next colonoscopy due in March 2026. No change in current plan as patient has had no recent polyps. -Schedule follow-up appointment in 3 months, or sooner if chest pain does not resolve or worsens.   Assessment & Plan:   Mixed hyperlipidemia Assessment & Plan: Well controlled.  No changes to medicines. Lipitor 20 mg nightly.  Continue to work on eating a healthy diet and exercise.  Labs drawn today.    Orders: -     Lipid panel  Primary hypertension Assessment & Plan: controlled Hydrochlorothiazide is on hold for kidney issues. Monitor bp at home and keep bp log. Labs drawn today  Orders: -     CBC with Differential/Platelet -     Comprehensive metabolic panel  Gastroesophageal reflux disease with esophagitis without hemorrhage Assessment & Plan: The current medical regimen is effective;  continue present plan and medications. Continue  nexium 40 mg twice daily.    Chronic narcotic dependence (HCC) Assessment & Plan: Due to RPS   Long-term current use of opiate analgesic Assessment & Plan: Uncomplicated   Screening for colon cancer -     Ambulatory referral to Gastroenterology  Chest wall pain Assessment & Plan: Order CT chest wo contrast  Orders: -     Ambulatory referral to Gastroenterology -     CT CHEST WO CONTRAST; Future  Chronic pain syndrome Assessment & Plan: Severe, constant, unilateral chest pain, worse with movement and deep breathing. No relief with Oxycodone 4 times daily or Tylenol. No relief with muscle relaxants. No associated rash, fever, cough, or shortness of breath. Pain is severe enough to cause insomnia and nausea. Physical exam suggests musculoskeletal origin. -Order non-contrast CT scan of the chest to rule out other causes. -Consider referral to physical therapy pending CT results.   Bladder dysfunction Assessment & Plan: Patient reports that bladder stimulator is no longer  functioning. Appointment with urologist scheduled for 01/07/2024 to discuss surgical options. -Continue current management and follow up with urologist as planned.   Asymptomatic HIV infection, with no history of HIV-related illness [facility]) Assessment & Plan: Management per specialist.        No orders of the defined types were placed in this encounter.   Orders Placed This Encounter  Procedures   CT Chest Wo Contrast   CBC with Differential/Platelet   Comprehensive metabolic panel   Lipid panel   Ambulatory referral to Gastroenterology     Follow-up: Return in about 3 months (around 03/21/2024) for chronic follow up.   I,Marla I Leal-Borjas,acting as a scribe for Blane Ohara, MD.,have documented all relevant documentation on the behalf of Blane Ohara, MD,as directed by  Blane Ohara, MD while in the presence of Blane Ohara, MD.   An After Visit Summary was printed and given to the patient.  I attest that I have reviewed this visit and agree with the plan scribed by my staff.   Blane Ohara, MD [facility] [phone]

## 2023-12-22 ENCOUNTER — Encounter: Payer: Self-pay | Admitting: Family Medicine

## 2023-12-22 ENCOUNTER — Ambulatory Visit (INDEPENDENT_AMBULATORY_CARE_PROVIDER_SITE_OTHER): Payer: Medicare HMO | Admitting: Family Medicine

## 2023-12-22 VITALS — BP 124/74 | HR 82 | Temp 97.6°F | Ht 70.0 in | Wt 207.0 lb

## 2023-12-22 DIAGNOSIS — I10 Essential (primary) hypertension: Secondary | ICD-10-CM | POA: Diagnosis not present

## 2023-12-22 DIAGNOSIS — R0789 Other chest pain: Secondary | ICD-10-CM | POA: Diagnosis not present

## 2023-12-22 DIAGNOSIS — G894 Chronic pain syndrome: Secondary | ICD-10-CM

## 2023-12-22 DIAGNOSIS — Z122 Encounter for screening for malignant neoplasm of respiratory organs: Secondary | ICD-10-CM

## 2023-12-22 DIAGNOSIS — E782 Mixed hyperlipidemia: Secondary | ICD-10-CM

## 2023-12-22 DIAGNOSIS — F1120 Opioid dependence, uncomplicated: Secondary | ICD-10-CM | POA: Diagnosis not present

## 2023-12-22 DIAGNOSIS — K2100 Gastro-esophageal reflux disease with esophagitis, without bleeding: Secondary | ICD-10-CM | POA: Diagnosis not present

## 2023-12-22 DIAGNOSIS — Z1211 Encounter for screening for malignant neoplasm of colon: Secondary | ICD-10-CM

## 2023-12-22 DIAGNOSIS — Z21 Asymptomatic human immunodeficiency virus [HIV] infection status: Secondary | ICD-10-CM

## 2023-12-22 DIAGNOSIS — Z79891 Long term (current) use of opiate analgesic: Secondary | ICD-10-CM | POA: Diagnosis not present

## 2023-12-22 DIAGNOSIS — N319 Neuromuscular dysfunction of bladder, unspecified: Secondary | ICD-10-CM

## 2023-12-22 DIAGNOSIS — R0781 Pleurodynia: Secondary | ICD-10-CM

## 2023-12-22 LAB — CBC WITH DIFFERENTIAL/PLATELET
Basophils Absolute: 0.1 10*3/uL (ref 0.0–0.2)
Basos: 1 %
EOS (ABSOLUTE): 0.1 10*3/uL (ref 0.0–0.4)
Eos: 1 %
Hematocrit: 43.9 % (ref 37.5–51.0)
Hemoglobin: 14.5 g/dL (ref 13.0–17.7)
Immature Grans (Abs): 0 10*3/uL (ref 0.0–0.1)
Immature Granulocytes: 0 %
Lymphocytes Absolute: 2.5 10*3/uL (ref 0.7–3.1)
Lymphs: 35 %
MCH: 31.5 pg (ref 26.6–33.0)
MCHC: 33 g/dL (ref 31.5–35.7)
MCV: 95 fL (ref 79–97)
Monocytes Absolute: 0.8 10*3/uL (ref 0.1–0.9)
Monocytes: 11 %
Neutrophils Absolute: 3.7 10*3/uL (ref 1.4–7.0)
Neutrophils: 52 %
Platelets: 204 10*3/uL (ref 150–450)
RBC: 4.61 x10E6/uL (ref 4.14–5.80)
RDW: 12.3 % (ref 11.6–15.4)
WBC: 7.2 10*3/uL (ref 3.4–10.8)

## 2023-12-22 LAB — COMPREHENSIVE METABOLIC PANEL
ALT: 12 [IU]/L (ref 0–44)
AST: 17 [IU]/L (ref 0–40)
Albumin: 4 g/dL (ref 3.8–4.9)
Alkaline Phosphatase: 91 [IU]/L (ref 44–121)
BUN/Creatinine Ratio: 14 (ref 9–20)
BUN: 18 mg/dL (ref 6–24)
Bilirubin Total: 0.4 mg/dL (ref 0.0–1.2)
CO2: 26 mmol/L (ref 20–29)
Calcium: 9.1 mg/dL (ref 8.7–10.2)
Chloride: 103 mmol/L (ref 96–106)
Creatinine, Ser: 1.31 mg/dL — ABNORMAL HIGH (ref 0.76–1.27)
Globulin, Total: 2.2 g/dL (ref 1.5–4.5)
Glucose: 91 mg/dL (ref 70–99)
Potassium: 4.8 mmol/L (ref 3.5–5.2)
Sodium: 141 mmol/L (ref 134–144)
Total Protein: 6.2 g/dL (ref 6.0–8.5)
eGFR: 63 mL/min/{1.73_m2} (ref >59)

## 2023-12-22 LAB — LIPID PANEL
Chol/HDL Ratio: 2.8 {ratio} (ref 0.0–5.0)
Cholesterol, Total: 166 mg/dL (ref 100–199)
HDL: 59 mg/dL (ref >39)
LDL Chol Calc (NIH): 89 mg/dL (ref 0–99)
Triglycerides: 101 mg/dL (ref 0–149)
VLDL Cholesterol Cal: 18 mg/dL (ref 5–40)

## 2023-12-23 ENCOUNTER — Encounter: Payer: Self-pay | Admitting: Family Medicine

## 2023-12-25 ENCOUNTER — Ambulatory Visit (INDEPENDENT_AMBULATORY_CARE_PROVIDER_SITE_OTHER)
Admission: RE | Admit: 2023-12-25 | Discharge: 2023-12-25 | Disposition: A | Discharge status: Home / Self Care | Payer: Medicare HMO | Source: Ambulatory Visit | Attending: Internal Medicine | Admitting: Internal Medicine

## 2023-12-25 ENCOUNTER — Other Ambulatory Visit: Payer: Self-pay | Admitting: Family Medicine

## 2023-12-25 DIAGNOSIS — R0781 Pleurodynia: Secondary | ICD-10-CM

## 2023-12-25 DIAGNOSIS — R0789 Other chest pain: Secondary | ICD-10-CM | POA: Diagnosis not present

## 2023-12-25 DIAGNOSIS — I700 Atherosclerosis of aorta: Secondary | ICD-10-CM | POA: Diagnosis not present

## 2023-12-26 DIAGNOSIS — N319 Neuromuscular dysfunction of bladder, unspecified: Secondary | ICD-10-CM | POA: Insufficient documentation

## 2023-12-26 DIAGNOSIS — G894 Chronic pain syndrome: Secondary | ICD-10-CM | POA: Insufficient documentation

## 2023-12-26 DIAGNOSIS — R0789 Other chest pain: Secondary | ICD-10-CM | POA: Insufficient documentation

## 2023-12-26 DIAGNOSIS — R0781 Pleurodynia: Secondary | ICD-10-CM | POA: Insufficient documentation

## 2023-12-26 NOTE — Assessment & Plan Note (Signed)
Due to RPS

## 2023-12-26 NOTE — Assessment & Plan Note (Signed)
The current medical regimen is effective;  continue present plan and medications. Continue nexium 40 mg twice daily.  

## 2023-12-26 NOTE — Assessment & Plan Note (Signed)
Order CT chest wo contrast

## 2023-12-26 NOTE — Assessment & Plan Note (Signed)
controlled Hydrochlorothiazide is on hold for kidney issues. Monitor bp at home and keep bp log. Labs drawn today

## 2023-12-26 NOTE — Assessment & Plan Note (Signed)
Uncomplicated. 

## 2023-12-26 NOTE — Assessment & Plan Note (Signed)
Patient reports that bladder stimulator is no longer functioning. Appointment with urologist scheduled for 01/07/2024 to discuss surgical options. -Continue current management and follow up with urologist as planned.

## 2023-12-26 NOTE — Assessment & Plan Note (Signed)
Well controlled.  No changes to medicines. Lipitor 20 mg nightly Continue to work on eating a healthy diet and exercise.  Labs drawn today.

## 2023-12-26 NOTE — Assessment & Plan Note (Signed)
Severe, constant, unilateral chest pain, worse with movement and deep breathing. No relief with Oxycodone 4 times daily or Tylenol. No relief with muscle relaxants. No associated rash, fever, cough, or shortness of breath. Pain is severe enough to cause insomnia and nausea. Physical exam suggests musculoskeletal origin. -Order non-contrast CT scan of the chest to rule out other causes. -Consider referral to physical therapy pending CT results.

## 2023-12-27 DIAGNOSIS — Z1211 Encounter for screening for malignant neoplasm of colon: Secondary | ICD-10-CM | POA: Insufficient documentation

## 2023-12-27 NOTE — Assessment & Plan Note (Signed)
 Management per specialist.

## 2023-12-31 ENCOUNTER — Other Ambulatory Visit: Payer: Self-pay | Admitting: Family Medicine

## 2023-12-31 NOTE — Telephone Encounter (Signed)
 Last Fill: 11/30/23  Last OV: 12/22/23 Next OV: 03/23/24  Routing to provider for review/authorization.

## 2023-12-31 NOTE — Telephone Encounter (Signed)
 Copied from CRM [id number]. Topic: Clinical - Medication Refill >> Dec 31, 2023 11:50 AM Farrel B wrote: Most Recent Primary Care Visit:  Provider: [facility]  [facility]  Visit Type: OFFICE VISIT  Date: 12/22/2023  Medication:  Oxycodone  HCl 20 MG TABS    Has the patient contacted their pharmacy? No (Agent: If no, request that the patient contact the pharmacy for the refill. If patient does not wish to contact the pharmacy document the reason why and proceed with request.)Patient states he hadn't spoken to the pharmacy because when he calls them they tell him its a control substance  (Agent: If yes, when and what did the pharmacy advise?)  Is this the correct pharmacy for this prescription? Yes If no, delete pharmacy and type the correct one.  This is the patient's preferred pharmacy:  [id number] - [place], [place] - [address] AT [facility] [address] [place] [address] Phone: [phone] Fax: [phone]   Has the prescription been filled recently? Yes  Is the patient out of the medication? No Friday morning is last dose  Has the patient been seen for an appointment in the last year OR does the patient have an upcoming appointment? Yes  Can we respond through MyChart? Yes  Agent: Please be advised that Rx refills may take up to 3 business days. We ask that you follow-up with your pharmacy.

## 2024-01-01 ENCOUNTER — Other Ambulatory Visit: Payer: Self-pay

## 2024-01-03 MED ORDER — OXYCODONE HCL 20 MG PO TABS: 20.0000 mg | ORAL_TABLET | Freq: Four times a day (QID) | ORAL | 0 refills | Status: DC | PRN

## 2024-01-03 MED ORDER — OXYCODONE HCL 20 MG PO TABS: 20.0000 mg | ORAL_TABLET | Freq: Four times a day (QID) | ORAL | Status: DC | PRN

## 2024-01-07 DIAGNOSIS — N3941 Urge incontinence: Secondary | ICD-10-CM | POA: Diagnosis not present

## 2024-01-07 DIAGNOSIS — R350 Frequency of micturition: Secondary | ICD-10-CM | POA: Diagnosis not present

## 2024-01-07 DIAGNOSIS — R351 Nocturia: Secondary | ICD-10-CM | POA: Diagnosis not present

## 2024-01-10 ENCOUNTER — Encounter: Payer: Self-pay | Admitting: Family Medicine

## 2024-01-28 ENCOUNTER — Other Ambulatory Visit: Payer: Self-pay | Admitting: Family Medicine

## 2024-01-28 NOTE — Telephone Encounter (Signed)
 Medication Refill -  Most Recent Primary Care Visit:  Provider: [facility], [facility]  Department[facility]  Visit Type: OFFICE VISIT  Date: 12/22/2023  Medication: Oxycodone   Has the patient contacted their pharmacy? Yes (Agent: If no, request that the patient contact the pharmacy for the refill. If patient does not wish to contact the pharmacy document the reason why and proceed with request.) (Agent: If yes, when and what did the pharmacy advise?)  Is this the correct pharmacy for this prescription? Yes If no, delete pharmacy and type the correct one.  This is the patient's preferred pharmacy:  [id number] - [place], [place] - [address] AT [facility] [address] [place] [address] Phone: [phone] Fax: [phone]   Has the prescription been filled recently? No  Is the patient out of the medication? No  will be out Sunday morning  Has the patient been seen for an appointment in the last year OR does the patient have an upcoming appointment? Yes  Can we respond through MyChart? No  Agent: Please be advised that Rx refills may take up to 3 business days. We ask that you follow-up with your pharmacy.

## 2024-01-29 MED ORDER — OXYCODONE HCL 20 MG PO TABS: 20.0000 mg | ORAL_TABLET | Freq: Four times a day (QID) | ORAL | 0 refills | Status: DC | PRN

## 2024-01-31 ENCOUNTER — Other Ambulatory Visit: Payer: Self-pay | Admitting: Family Medicine

## 2024-02-02 ENCOUNTER — Other Ambulatory Visit: Payer: Self-pay | Admitting: Podiatry

## 2024-02-02 ENCOUNTER — Other Ambulatory Visit: Payer: Self-pay | Admitting: Physician Assistant

## 2024-02-02 ENCOUNTER — Telehealth: Payer: Self-pay

## 2024-02-02 DIAGNOSIS — B351 Tinea unguium: Secondary | ICD-10-CM

## 2024-02-02 NOTE — Telephone Encounter (Signed)
 Medication has been sent.

## 2024-02-03 ENCOUNTER — Other Ambulatory Visit: Payer: Self-pay | Admitting: Family Medicine

## 2024-02-03 ENCOUNTER — Other Ambulatory Visit: Payer: Self-pay

## 2024-02-03 MED ORDER — OXYCODONE HCL 20 MG PO TABS: 20.0000 mg | ORAL_TABLET | Freq: Four times a day (QID) | ORAL | 0 refills | Status: DC | PRN

## 2024-02-03 MED ORDER — OXYCODONE HCL 20 MG PO TABS
20.0000 mg | ORAL_TABLET | Freq: Four times a day (QID) | ORAL | Status: DC | PRN
Start: 2024-02-03 — End: 2024-02-03

## 2024-02-12 DIAGNOSIS — N2889 Other specified disorders of kidney and ureter: Secondary | ICD-10-CM | POA: Diagnosis not present

## 2024-02-12 DIAGNOSIS — T85193A Other mechanical complication of implanted electronic neurostimulator, generator, initial encounter: Secondary | ICD-10-CM | POA: Diagnosis not present

## 2024-02-12 DIAGNOSIS — R350 Frequency of micturition: Secondary | ICD-10-CM | POA: Diagnosis not present

## 2024-02-12 DIAGNOSIS — N201 Calculus of ureter: Secondary | ICD-10-CM | POA: Diagnosis not present

## 2024-02-19 DIAGNOSIS — R350 Frequency of micturition: Secondary | ICD-10-CM | POA: Diagnosis not present

## 2024-03-01 ENCOUNTER — Other Ambulatory Visit: Payer: Self-pay | Admitting: Family Medicine

## 2024-03-01 MED ORDER — OXYCODONE HCL 20 MG PO TABS: 20.0000 mg | ORAL_TABLET | Freq: Four times a day (QID) | ORAL | 0 refills | Status: DC | PRN

## 2024-03-01 NOTE — Telephone Encounter (Signed)
 Last Fill: 02/03/24 120 tabs/0 RF  Last OV: 12/22/23 Next OV: 03/23/24  Routing to provider for review/authorization.

## 2024-03-01 NOTE — Telephone Encounter (Signed)
 Copied from CRM [id number]. Topic: Clinical - Medication Refill >> Mar 01, 2024 12:36 PM Clayton Bibles wrote: Most Recent Primary Care Visit:  Provider: [facility]  [facility]  Visit Type: OFFICE VISIT  Date: 12/22/2023  Medication: Oxycodone HCl 20 MG TABS - This medication can be filled on 4/11 or 4/12  Has the patient contacted their pharmacy? Yes (Agent: If no, request that the patient contact the pharmacy for the refill. If patient does not wish to contact the pharmacy document the reason why and proceed with request.) (Agent: If yes, when and what did the pharmacy advise?) Pharmacy needs order to refill  Is this the correct pharmacy for this prescription? Yes If no, delete pharmacy and type the correct one.  This is the patient's preferred pharmacy:  [id number] - [place], [place] - [address] AT [facility] [address] [place] [address] Phone: [phone] Fax: [phone]   Has the prescription been filled recently? No  Is the patient out of the medication? No - He has enough medication to last him to Friday   Has the patient been seen for an appointment in the last year OR does the patient have an upcoming appointment? Yes  Can we respond through MyChart? Yes  Agent: Please be advised that Rx refills may take up to 3 business days. We ask that you follow-up with your pharmacy.

## 2024-03-02 MED ORDER — OXYCODONE HCL 20 MG PO TABS
20.0000 mg | ORAL_TABLET | Freq: Four times a day (QID) | ORAL | 0 refills | Status: DC | PRN
Start: 2024-03-02 — End: 2024-03-23

## 2024-03-08 DIAGNOSIS — R350 Frequency of micturition: Secondary | ICD-10-CM | POA: Diagnosis not present

## 2024-03-14 DIAGNOSIS — N179 Acute kidney failure, unspecified: Secondary | ICD-10-CM | POA: Diagnosis not present

## 2024-03-14 DIAGNOSIS — G8929 Other chronic pain: Secondary | ICD-10-CM | POA: Diagnosis not present

## 2024-03-14 DIAGNOSIS — M1990 Unspecified osteoarthritis, unspecified site: Secondary | ICD-10-CM | POA: Diagnosis not present

## 2024-03-14 DIAGNOSIS — Z8673 Personal history of transient ischemic attack (TIA), and cerebral infarction without residual deficits: Secondary | ICD-10-CM | POA: Diagnosis not present

## 2024-03-14 DIAGNOSIS — I891 Lymphangitis: Secondary | ICD-10-CM | POA: Diagnosis not present

## 2024-03-14 DIAGNOSIS — E7800 Pure hypercholesterolemia, unspecified: Secondary | ICD-10-CM | POA: Diagnosis not present

## 2024-03-14 DIAGNOSIS — Z79899 Other long term (current) drug therapy: Secondary | ICD-10-CM | POA: Diagnosis not present

## 2024-03-14 DIAGNOSIS — I129 Hypertensive chronic kidney disease with stage 1 through stage 4 chronic kidney disease, or unspecified chronic kidney disease: Secondary | ICD-10-CM | POA: Diagnosis not present

## 2024-03-14 DIAGNOSIS — E871 Hypo-osmolality and hyponatremia: Secondary | ICD-10-CM | POA: Diagnosis not present

## 2024-03-14 DIAGNOSIS — L03116 Cellulitis of left lower limb: Secondary | ICD-10-CM | POA: Diagnosis not present

## 2024-03-14 DIAGNOSIS — N1831 Chronic kidney disease, stage 3a: Secondary | ICD-10-CM | POA: Diagnosis not present

## 2024-03-14 DIAGNOSIS — K589 Irritable bowel syndrome without diarrhea: Secondary | ICD-10-CM | POA: Diagnosis not present

## 2024-03-14 DIAGNOSIS — Z888 Allergy status to other drugs, medicaments and biological substances status: Secondary | ICD-10-CM | POA: Diagnosis not present

## 2024-03-14 DIAGNOSIS — F17210 Nicotine dependence, cigarettes, uncomplicated: Secondary | ICD-10-CM | POA: Diagnosis not present

## 2024-03-14 DIAGNOSIS — Z881 Allergy status to other antibiotic agents status: Secondary | ICD-10-CM | POA: Diagnosis not present

## 2024-03-14 DIAGNOSIS — Z792 Long term (current) use of antibiotics: Secondary | ICD-10-CM | POA: Diagnosis not present

## 2024-03-14 DIAGNOSIS — Z7982 Long term (current) use of aspirin: Secondary | ICD-10-CM | POA: Diagnosis not present

## 2024-03-14 DIAGNOSIS — M7989 Other specified soft tissue disorders: Secondary | ICD-10-CM | POA: Diagnosis not present

## 2024-03-14 DIAGNOSIS — A419 Sepsis, unspecified organism: Secondary | ICD-10-CM | POA: Diagnosis not present

## 2024-03-14 DIAGNOSIS — Z880 Allergy status to penicillin: Secondary | ICD-10-CM | POA: Diagnosis not present

## 2024-03-14 DIAGNOSIS — K219 Gastro-esophageal reflux disease without esophagitis: Secondary | ICD-10-CM | POA: Diagnosis not present

## 2024-03-14 DIAGNOSIS — N189 Chronic kidney disease, unspecified: Secondary | ICD-10-CM | POA: Diagnosis not present

## 2024-03-14 DIAGNOSIS — M79605 Pain in left leg: Secondary | ICD-10-CM | POA: Diagnosis not present

## 2024-03-14 DIAGNOSIS — R6520 Severe sepsis without septic shock: Secondary | ICD-10-CM | POA: Diagnosis not present

## 2024-03-23 ENCOUNTER — Encounter: Payer: Self-pay | Admitting: Family Medicine

## 2024-03-23 ENCOUNTER — Ambulatory Visit: Payer: Medicare HMO | Admitting: Family Medicine

## 2024-03-23 VITALS — BP 128/72 | HR 65 | Temp 97.9°F | Ht 70.0 in | Wt 204.0 lb

## 2024-03-23 DIAGNOSIS — K2100 Gastro-esophageal reflux disease with esophagitis, without bleeding: Secondary | ICD-10-CM | POA: Diagnosis not present

## 2024-03-23 DIAGNOSIS — G629 Polyneuropathy, unspecified: Secondary | ICD-10-CM | POA: Diagnosis not present

## 2024-03-23 DIAGNOSIS — E782 Mixed hyperlipidemia: Secondary | ICD-10-CM

## 2024-03-23 DIAGNOSIS — L03116 Cellulitis of left lower limb: Secondary | ICD-10-CM | POA: Diagnosis not present

## 2024-03-23 DIAGNOSIS — G894 Chronic pain syndrome: Secondary | ICD-10-CM | POA: Diagnosis not present

## 2024-03-23 DIAGNOSIS — K5909 Other constipation: Secondary | ICD-10-CM

## 2024-03-23 DIAGNOSIS — I10 Essential (primary) hypertension: Secondary | ICD-10-CM | POA: Diagnosis not present

## 2024-03-23 NOTE — Progress Notes (Signed)
 Subjective:  Patient ID: Kevin Pena, male    DOB: 1966-10-21  Age: 58 y.o. MRN: [id number]  Chief Complaint  Patient presents with   Medical Management of Chronic Issues    HPI: Patient was in [facility] for cellulitis, will finish ax bx this coming Saturday. On augmentin. Admitted on Monday 03/14/2024-03/21/2024. Given IV antibiotics zosyn and vancomycin . Sepsis was a concern. Also had AKI.   Chronic pain syndrome/regional pain syndrome: On oxycodone  20 mg 4 times daily as needed. Complex regional pain syndrome 1 of lower right leg.    Insomnia/osa: Using CPAP. Had lost weight and improved. Has been over 5 years since sleep study.  Depression: on celexa 10 mg daily. Fairly well controlled.   HIV: On Odefsey one daily.  Per the patient his T-cell numbers are nondetectible. Sees ID doctor.  Hypertension: On hydrochlorothiazide  25 mg nightly (currently on hold due to kidney issues)   GERD: On Nexium 40 mg twice daily. If goes down to once daily, develops reflux. Well controlled at twice daily.    Constipation: On Linzess  72 mcg every morning.   Hyperlipidemia: On Lipitor 20 mg nightly.   Eats fairly healthy.      03/23/2024   10:08 AM 09/07/2023    1:32 PM 06/18/2023    1:27 PM 06/18/2023    1:22 PM 06/18/2023    1:20 PM  Depression screen PHQ 2/9  Decreased Interest 1 0 0 0 0  Down, Depressed, Hopeless 1 0 1 0 0  PHQ - 2 Score 2 0 1 0 0  Altered sleeping 1 2 1     Tired, decreased energy 2 1 0    Change in appetite 2 1 1     Feeling bad or failure about yourself  1 0 0    Trouble concentrating 1 0 0    Moving slowly or fidgety/restless 0 0 0    Suicidal thoughts 0 0 0    PHQ-9 Score 9 4 3     Difficult doing work/chores Somewhat difficult Somewhat difficult           09/07/2023    1:32 PM  Fall Risk   Falls in the past year? 0  Number falls in past yr: 0  Injury with Fall? 0  Risk for fall due to : No Fall Risks  Follow up Falls evaluation completed;Follow up  appointment    Patient Care Team: Mercy Stall, MD as PCP - General (Internal Medicine) Byran Bilotti, Nigel Bart, MD as Referring Physician (Infectious Diseases)   Review of Systems  Constitutional:  Negative for chills, diaphoresis, fatigue and fever.  HENT:  Negative for congestion, ear pain and sore throat.   Respiratory:  Negative for cough and shortness of breath.   Cardiovascular:  Negative for chest pain and leg swelling.  Gastrointestinal:  Negative for abdominal pain, constipation, diarrhea, nausea and vomiting.  Genitourinary:  Negative for dysuria and urgency.  Neurological:  Negative for dizziness and headaches.  Psychiatric/Behavioral:  Negative for dysphoric mood.     Current Outpatient Medications on File Prior to Visit  Medication Sig Dispense Refill   amoxicillin-clavulanate (AUGMENTIN) 875-125 MG tablet Take 1 tablet by mouth 2 (two) times daily.     citalopram (CELEXA) 10 MG tablet Take 10 mg by mouth daily.     albuterol  (VENTOLIN  HFA) 108 (90 Base) MCG/ACT inhaler TAKE 2 PUFFS BY MOUTH EVERY 6 HOURS AS NEEDED FOR WHEEZE OR SHORTNESS OF BREATH 17 each 3   atorvastatin (LIPITOR) 20 MG tablet TAKE  1 TABLET BY MOUTH EVERY DAY 90 tablet 1   esomeprazole (NEXIUM) 40 MG capsule TAKE 1 CAPSULE BY MOUTH TWICE A DAY 180 capsule 1   fluticasone  (FLONASE ) 50 MCG/ACT nasal spray SPRAY 1 SPRAY INTO EACH NOSTRIL EVERY DAY AS NEEDED FOR ALLERGIES 48 mL 2   linaclotide  (LINZESS ) 72 MCG capsule TAKE 1 CAPSULE BY MOUTH EVERY DAY BEFORE BREAKFAST 90 capsule 1   ODEFSEY 200-25-25 MG TABS tablet Take 1 tablet by mouth every morning.     Oxycodone  HCl 20 MG TABS Take 1 tablet (20 mg total) by mouth 4 (four) times daily as needed (severe pain). 120 tablet 0   promethazine  (PHENERGAN ) 25 MG tablet TAKE 1 TABLET BY MOUTH EVERY 8 HOURS AS NEEDED FOR NAUSEA OR VOMITING. 30 tablet 1   valACYclovir (VALTREX) 500 MG tablet Take 1 tablet by mouth daily as needed (outbreaks).      No current  facility-administered medications on file prior to visit.   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   Fecal urgency 09/15/2014   GERD (gastroesophageal reflux disease)    HIV (human immunodeficiency virus infection) (HCC)    Hyperlipidemia    Hypertension 03/29/2014   Internal hemorrhoids    Left hip pain 05/18/2022   Long-term current use of opiate analgesic 03/14/2020   Lumbar pain 05/18/2022   Mixed hyperlipidemia 03/29/2014   Neuropathy 06/18/2017   Opioid use 09/14/2022   Plantar fasciitis of right foot 01/04/2016   Postoperative examination 03/10/2016   Preoperative clearance 12/24/2022   Presence of tooth-root and mandibular implants 05/29/2020   Rash left leg-- using topical cream   Spondylosis with myelopathy, 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   Thrombosed external hemorrhoid 01/28/2016   Tobacco abuse 11/06/2016   Toenail deformity 05/31/2023   Past Surgical History:  Procedure Laterality Date   APPENDECTOMY  1981   CHOLECYSTECTOMY  2006   dental implants  11/2020   FOOT SURGERY     HAMMER TOE SURGERY  OCT 2010   INTERSTIM IMPLANT PLACEMENT  06/17/2012   Procedure: Simona [place] IMPLANT FIRST STAGE;  Surgeon: Devorah Fonder, MD;  Location: [facility];  Service: Urology;  Laterality: N/A;  RAD TECH OK PER VICKIE AT MAIN OR    INTERSTIM IMPLANT PLACEMENT  06/17/2012   Procedure: INTERSTIM IMPLANT SECOND STAGE;  Surgeon: Devorah Fonder, MD;  Location: [facility];  Service: Urology;  Laterality: N/A;   MOUTH SURGERY  02/16/2020   REPAIR RECURRENT RIGHT INGUINAL HERNIA  2000   RIGHT INGUINAL HERNIA REPAIR  1997   UMBILICAL HERNIA REPAIR  2010    Family History  Problem Relation Age of Onset   Other Mother        colon resection, tumor near liver   Diabetes Mother    Hyperlipidemia Mother    Hypertension Mother    Varicose Veins Mother    Heart Problems Father    Diabetes Father    Heart disease Father    Hyperlipidemia Father  Hypertension Father    Pancreatic cancer Neg Hx    Rectal cancer Neg Hx    Stomach cancer Neg Hx    Colon cancer Neg Hx    Social History   Socioeconomic History   Marital status: Significant Other    Spouse name: Marvella Slight   Number of children: 1   Years of education: College   Highest education level: Not on file  Occupational History    Employer: OTHER    Comment: disability  Tobacco Use   Smoking status: Former    Current packs/day: 0.00    Average packs/day: 1 pack/day for 30.0 years (30.0 ttl pk-yrs)    Types: Cigarettes    Start date: 06/11/1980    Quit date: 06/11/2010    Years since quitting: 13.8   Smokeless tobacco: Never  Vaping Use   Vaping status: Some Days  Substance and Sexual Activity   Alcohol  use: Yes    Alcohol /week: 2.0 standard drinks of alcohol     Types: 2 Cans of beer per week    Comment: rarely   Drug use: No   Sexual activity: Not Currently    Partners: Male  Other Topics Concern   Not on file  Social History Narrative   Patient lives at home with partner.   Caffeine Use: 32oz daily coffee/tea      Disability.    Social Drivers of Corporate investment banker Strain: Low Risk  (03/23/2024)   Overall Financial Resource Strain (CARDIA)    Difficulty of Paying Living Expenses: Not hard at all  Food Insecurity: No Food Insecurity (03/23/2024)   Hunger Vital Sign    Worried About Running Out of Food in the Last Year: Never true    Ran Out of Food in the  Last Year: Never true  Transportation Needs: No Transportation Needs (03/23/2024)   PRAPARE - Administrator, Civil Service (Medical): No    Lack of Transportation (Non-Medical): No  Physical Activity: Insufficiently Active (03/23/2024)   Exercise Vital Sign    Days of Exercise per Week: 3 days    Minutes of Exercise per Session: 30 min  Stress: No Stress Concern Present (03/23/2024)   [employer] of Occupational Health - Occupational Stress Questionnaire    Feeling of Stress : Not at all  Social Connections: Moderately Isolated (03/23/2024)   Social Connection and Isolation Panel [NHANES]    Frequency of Communication with Friends and Family: Twice a week    Frequency of Social Gatherings with Friends and Family: Twice a week    Attends Religious Services: Never    Diplomatic Services operational officer: No    Attends Engineer, structural: Never    Marital Status: Living with partner    Objective:  BP 128/72   Pulse 65   Temp 97.9 F (36.6 C)   Ht 5\' 10"  (1.778 m)   Wt 204 lb (92.5 kg)   SpO2 96%   BMI 29.27 kg/m      03/23/2024   10:06 AM 12/22/2023   10:45 AM 11/11/2023    6:34 PM  BP/Weight  Systolic BP 128 124 131  Diastolic BP 72 74 65  Wt. (Lbs) 204 207   BMI 29.27 kg/m2 29.7 kg/m2     Physical Exam Vitals reviewed.  Constitutional:      Appearance: Normal appearance.  Neck:     Vascular: No carotid bruit.  Cardiovascular:     Rate and Rhythm: Normal rate and  regular rhythm.     Heart sounds: Normal heart sounds.  Pulmonary:     Effort: Pulmonary effort is normal.     Breath sounds: Normal breath sounds. No wheezing, rhonchi or rales.  Abdominal:     General: Bowel sounds are normal.     Palpations: Abdomen is soft.     Tenderness: There is no abdominal tenderness.  Skin:    Findings: Erythema (up to 2/3 of lower left leg and it is improving. redness has improved. mild pink.) present.  Neurological:     Mental Status: He is  alert.  Psychiatric:        Mood and Affect: Mood normal.        Behavior: Behavior normal.     Diabetic Foot Exam - Simple   No data filed      Lab Results  Component Value Date   WBC 7.6 03/23/2024   HGB 14.3 03/23/2024   HCT 42.4 03/23/2024   PLT 345 03/23/2024   GLUCOSE 79 03/23/2024   CHOL 157 03/23/2024   TRIG 86 03/23/2024   HDL 33 (L) 03/23/2024   LDLCALC 108 (H) 03/23/2024   ALT 15 03/23/2024   AST 16 03/23/2024   NA 140 03/23/2024   K 5.3 (H) 03/23/2024   CL 102 03/23/2024   CREATININE 1.15 03/23/2024   BUN 16 03/23/2024   CO2 24 03/23/2024      Assessment & Plan:  Primary hypertension Assessment & Plan: controlled Hydrochlorothiazide  is on hold for kidney issues. Monitor bp at home and keep bp log. Labs drawn today  Orders: -     CBC with Differential/Platelet -     Comprehensive metabolic panel with GFR  Gastroesophageal reflux disease with esophagitis without hemorrhage Assessment & Plan: The current medical regimen is effective;  continue present plan and medications. Continue nexium 40 mg twice daily.    Chronic constipation Assessment & Plan: The current medical regimen is effective;  continue present plan and medications. Continue linzess  72 mg once daily.    Neuropathy Assessment & Plan: Stable.continue oxycodone .   Mixed hyperlipidemia Assessment & Plan: Well controlled.  No changes to medicines. Lipitor 20 mg nightly.  Continue to work on eating a healthy diet and exercise.  Labs drawn today.    Orders: -     Lipid panel  Chronic pain syndrome Assessment & Plan: The current medical regimen is effective;  continue present plan and medications.    Left leg cellulitis Assessment & Plan: Improved. Complete augmentin.      No orders of the defined types were placed in this encounter.   Orders Placed This Encounter  Procedures   CBC with Differential/Platelet   Comprehensive metabolic panel with GFR   Lipid  panel     Follow-up: Return in about 3 months (around 06/22/2024) for chronic follow up.   I,Marla I Leal-Borjas,acting as a scribe for Mercy Stall, MD.,have documented all relevant documentation on the behalf of Mercy Stall, MD,as directed by  Mercy Stall, MD while in the presence of Mercy Stall, MD.   An After Visit Summary was printed and given to the patient.  I attest that I have reviewed this visit and agree with the plan scribed by my staff.   Mercy Stall, MD [facility] [phone]

## 2024-03-24 ENCOUNTER — Encounter: Payer: Self-pay | Admitting: Family Medicine

## 2024-03-24 LAB — CBC WITH DIFFERENTIAL/PLATELET
Basophils Absolute: 0.1 10*3/uL (ref 0.0–0.2)
Basos: 1 %
EOS (ABSOLUTE): 0.1 10*3/uL (ref 0.0–0.4)
Eos: 1 %
Hematocrit: 42.4 % (ref 37.5–51.0)
Hemoglobin: 14.3 g/dL (ref 13.0–17.7)
Immature Grans (Abs): 0 10*3/uL (ref 0.0–0.1)
Immature Granulocytes: 1 %
Lymphocytes Absolute: 2.5 10*3/uL (ref 0.7–3.1)
Lymphs: 33 %
MCH: 31.6 pg (ref 26.6–33.0)
MCHC: 33.7 g/dL (ref 31.5–35.7)
MCV: 94 fL (ref 79–97)
Monocytes Absolute: 0.9 10*3/uL (ref 0.1–0.9)
Monocytes: 12 %
Neutrophils Absolute: 4 10*3/uL (ref 1.4–7.0)
Neutrophils: 52 %
Platelets: 345 10*3/uL (ref 150–450)
RBC: 4.52 x10E6/uL (ref 4.14–5.80)
RDW: 12.4 % (ref 11.6–15.4)
WBC: 7.6 10*3/uL (ref 3.4–10.8)

## 2024-03-24 LAB — LIPID PANEL
Chol/HDL Ratio: 4.8 ratio (ref 0.0–5.0)
Cholesterol, Total: 157 mg/dL (ref 100–199)
HDL: 33 mg/dL — ABNORMAL LOW (ref >39)
LDL Chol Calc (NIH): 108 mg/dL — ABNORMAL HIGH (ref 0–99)
Triglycerides: 86 mg/dL (ref 0–149)
VLDL Cholesterol Cal: 16 mg/dL (ref 5–40)

## 2024-03-24 LAB — COMPREHENSIVE METABOLIC PANEL WITH GFR
ALT: 15 IU/L (ref 0–44)
AST: 16 IU/L (ref 0–40)
Albumin: 4.3 g/dL (ref 3.8–4.9)
Alkaline Phosphatase: 78 IU/L (ref 44–121)
BUN/Creatinine Ratio: 14 (ref 10–24)
BUN: 16 mg/dL (ref 8–27)
Bilirubin Total: 0.4 mg/dL (ref 0.0–1.2)
CO2: 24 mmol/L (ref 20–29)
Calcium: 9.8 mg/dL (ref 8.6–10.2)
Chloride: 102 mmol/L (ref 96–106)
Creatinine, Ser: 1.15 mg/dL (ref 0.76–1.27)
Globulin, Total: 2.4 g/dL (ref 1.5–4.5)
Glucose: 79 mg/dL (ref 70–99)
Potassium: 5.3 mmol/L — ABNORMAL HIGH (ref 3.5–5.2)
Sodium: 140 mmol/L (ref 134–144)
Total Protein: 6.7 g/dL (ref 6.0–8.5)
eGFR: 73 mL/min/{1.73_m2} (ref >59)

## 2024-03-26 NOTE — Assessment & Plan Note (Signed)
The current medical regimen is effective;  continue present plan and medications. Continue nexium 40 mg twice daily.  

## 2024-03-26 NOTE — Assessment & Plan Note (Signed)
 controlled Hydrochlorothiazide is on hold for kidney issues. Monitor bp at home and keep bp log. Labs drawn today

## 2024-03-26 NOTE — Assessment & Plan Note (Signed)
 Stable

## 2024-03-27 DIAGNOSIS — L03116 Cellulitis of left lower limb: Secondary | ICD-10-CM | POA: Insufficient documentation

## 2024-03-27 NOTE — Assessment & Plan Note (Signed)
The current medical regimen is effective;  continue present plan and medications. Continue linzess 72 mg once daily.

## 2024-03-27 NOTE — Assessment & Plan Note (Signed)
 The current medical regimen is effective;  continue present plan and medications.

## 2024-03-27 NOTE — Assessment & Plan Note (Signed)
Well controlled.  No changes to medicines. Lipitor 20 mg nightly Continue to work on eating a healthy diet and exercise.  Labs drawn today.

## 2024-03-27 NOTE — Assessment & Plan Note (Signed)
 Improved. Complete augmentin.

## 2024-03-28 ENCOUNTER — Ambulatory Visit (INDEPENDENT_AMBULATORY_CARE_PROVIDER_SITE_OTHER): Admitting: Family Medicine

## 2024-03-28 ENCOUNTER — Ambulatory Visit: Payer: Self-pay

## 2024-03-28 ENCOUNTER — Encounter: Payer: Self-pay | Admitting: Family Medicine

## 2024-03-28 VITALS — BP 128/72 | HR 75 | Temp 98.6°F | Ht 70.0 in | Wt 202.6 lb

## 2024-03-28 DIAGNOSIS — Z833 Family history of diabetes mellitus: Secondary | ICD-10-CM

## 2024-03-28 DIAGNOSIS — Z79891 Long term (current) use of opiate analgesic: Secondary | ICD-10-CM | POA: Diagnosis not present

## 2024-03-28 DIAGNOSIS — R350 Frequency of micturition: Secondary | ICD-10-CM | POA: Diagnosis not present

## 2024-03-28 DIAGNOSIS — L03116 Cellulitis of left lower limb: Secondary | ICD-10-CM

## 2024-03-28 MED ORDER — OXYCODONE HCL 20 MG PO TABS: 20.0000 mg | ORAL_TABLET | Freq: Four times a day (QID) | ORAL | 0 refills | Status: DC | PRN

## 2024-03-28 MED ORDER — AMOXICILLIN-POT CLAVULANATE 875-125 MG PO TABS: 1.0000 | ORAL_TABLET | Freq: Two times a day (BID) | ORAL | 0 refills | Status: AC

## 2024-03-28 NOTE — Telephone Encounter (Signed)
  Chief Complaint: worsening leg infection Symptoms: redness, pain, Frequency: ongoing since 4/21, worse since 5/3, finished abx on 5/3 Pertinent Negatives: Patient denies fever, drainage Disposition: [] ED /[] Urgent Care (no appt availability in office) / [x] Appointment(In office/virtual)/ []  [facility]/ [] Home Care/ [] Refused Recommended Disposition /[] [facility]/ []  Follow-up with PCP Additional Notes: Pt states that he was admitted and treated for cellulitis on 4/21, pt states that he was released on approx 4/30. Pt states thhat he was d/c on PO abx. States seemed to improve but as of 5/3 pt states that he feels it has gotten worse. Pt states that the redness has increased as has the pain. Pt states he finished the PO Augmentin on 5/3. Pt denies DM. Pt scheduled today.  Copied from CRM [id number]. Topic: Clinical - Medication Question >> Mar 28, 2024 10:57 AM Essie A wrote: Reason for CRM: Patient was taking amoxicillin-clavulanate (AUGMENTIN) 875-125 MG tablet for infection in left leg.  The medication has not changed anything.  May need something stronger.  Please call patient to advise at [phone]. Reason for Disposition . [1] Finished taking antibiotic AND [2] cellulitis symptoms are WORSE (e.g., redness, pain, drainage, swelling)  Answer Assessment - Initial Assessment Questions 1. SYMPTOM: "What's the main symptom you're concerned about?" (e.g., redness, swelling, pain, fever, weakness)     Increase in pain, redness 2. CELLULITIS LOCATION: "Where is the cellulitis located?" (e.g., hand, arm, foot, leg, face)     L leg  3. CELLULITIS SIZE: "What is the size of the red area?" (e.g., inches, centimeters; compare to size of a coin) .     6"x3", center of calf to ankle about 3 inches wide 4. BETTER-SAME-WORSE: "Are you getting better, staying the same, or getting worse compared to the day you started the antibiotics?"      worse 5. PAIN: Do you have any pain?"  If  Yes, ask: "How bad is the pain?"  (e.g., Scale 1-10; mild, moderate, or severe)    - MILD (1-3): Doesn't interfere with normal activities.     - MODERATE (4-7): Interferes with normal activities or awakens from sleep.    - SEVERE (8-10): Excruciating pain, unable to do any normal activities.       8 6. FEVER: "Do you have a fever?" If Yes, ask: "What is it, how was it measured and when did it start?"     denies 7. OTHER SYMPTOMS: "Do you have any other symptoms?" (e.g., pus coming from a wound, red streaks, weakness)     denies 8. DIAGNOSIS DATE: "When was the cellulitis diagnosed?" "By whom?"      Hospital/ED, about 03/14/24 9. ANTIBIOTIC NAME: "What antibiotic(s) are you taking?"  "How many times per day?" (Be sure the patient is receiving the antibiotic as directed).      Did abx IV but sent home on PO augmentin 10. ANTIBIOTIC DATE: "When was the antibiotic started?"       Finished on 5/3 11. FOLLOW-UP APPOINTMENT: "Do you have follow-up appointment with your doctor?"       Did f/u  Protocols used: Cellulitis on Antibiotic Follow-up Call-A-[facility]

## 2024-03-28 NOTE — Progress Notes (Signed)
 Acute Office Visit  Subjective:    Patient ID: Kevin Pena, male    DOB: 1966-04-10, 58 y.o.   MRN: [id number]  Chief Complaint  Patient presents with   Leg Pain    cellulitus   Discussed the use of AI scribe software for clinical note transcription with the patient, who gave verbal consent to proceed.  History of Present Illness   Kevin Pena "Kevin Pena" is a 58 year old male with a history of cellulitis who presents with ongoing leg pain and concerns about cellulitis.  He was recently hospitalized for cellulitis and treated with IV antibiotics (Vancomycin ) for six days, followed by oral Augmentin for seven days. Despite this treatment, he continues to experience significant pain in his left leg, described as tender and painful to touch. The pain has persisted since discharge, and he has been elevating his leg.  He recalls a similar episode of cellulitis in 2015, which required hospitalization and treatment for MRSA. During that time, he was admitted after visiting his HIV doctor, who identified the need for immediate care. He was treated with vancomycin  and other antibiotics, but experienced complications with IV access, requiring multiple attempts to maintain a line.  He has a history of an allergy to ampicillin but reports no issues with Augmentin. He also takes oxycodone  for pain management, which he finds effective in dulling the pain to a manageable level. He avoids stronger narcotics like Dilaudid  due to adverse effects.  He experiences night sweats, which he attributes to his HIV medication. No fever, but mentions low-grade fevers in the past. He also reports chronic pain in his knees, hips, and lower back, exacerbated by a fall during a family event.  He has a strong family history of diabetes, with his mother, father, brother, and sister all affected. He experiences increased thirst and frequent urination, raising concerns about his own risk for diabetes.      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   Fecal urgency 09/15/2014   GERD (gastroesophageal reflux disease)    HIV (human immunodeficiency virus infection) (HCC)    Hyperlipidemia    Hypertension 03/29/2014   Internal hemorrhoids    Left hip pain 05/18/2022   Long-term current use of opiate analgesic 03/14/2020   Lumbar pain 05/18/2022   Mixed hyperlipidemia 03/29/2014   Neuropathy 06/18/2017   Opioid use 09/14/2022   Plantar fasciitis of right foot 01/04/2016   Postoperative examination 03/10/2016   Preoperative clearance 12/24/2022   Presence of tooth-root and mandibular implants 05/29/2020   Rash left leg-- using topical cream   Spondylosis with myelopathy, 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   Thrombosed external hemorrhoid 01/28/2016   Tobacco abuse 11/06/2016   Toenail deformity 05/31/2023    Past Surgical History:  Procedure Laterality Date   APPENDECTOMY  1981   CHOLECYSTECTOMY  2006   dental implants  11/2020   FOOT SURGERY     HAMMER TOE SURGERY  OCT 2010   INTERSTIM IMPLANT PLACEMENT  06/17/2012   Procedure: INTERSTIM IMPLANT FIRST STAGE;  Surgeon: Devorah Fonder, MD;  Location: [facility];  Service: Urology;  Laterality: N/A;  RAD TECH OK PER VICKIE AT MAIN OR    INTERSTIM IMPLANT PLACEMENT  06/17/2012   Procedure: INTERSTIM IMPLANT SECOND STAGE;  Surgeon: Devorah Fonder, MD;  Location: [facility];  Service: Urology;  Laterality: N/A;   MOUTH SURGERY   02/16/2020   REPAIR RECURRENT RIGHT INGUINAL HERNIA  2000   RIGHT INGUINAL HERNIA REPAIR  1997   UMBILICAL HERNIA REPAIR  2010    Family History  Problem Relation Age of Onset   Other Mother        colon resection, tumor near liver   Diabetes Mother    Hyperlipidemia Mother    Hypertension Mother    Varicose Veins Mother    Heart Problems Father    Diabetes Father    Heart disease Father    Hyperlipidemia Father    Hypertension Father    Pancreatic cancer Neg Hx    Rectal cancer Neg Hx    Stomach cancer Neg Hx    Colon cancer Neg Hx     Social History   Socioeconomic History   Marital status: Significant Other    Spouse name: Marvella Slight   Number of children: 1   Years of education: College   Highest education level: Not on file  Occupational History    Employer: OTHER    Comment: disability  Tobacco Use   Smoking status: Former    Current packs/day: 0.00    Average packs/day: 1 pack/day for 30.0 years (30.0 ttl pk-yrs)    Types: Cigarettes    Start date: 06/11/1980    Quit date: 06/11/2010    Years since quitting: 13.8   Smokeless tobacco: Never  Vaping Use   Vaping status: Some Days  Substance and Sexual Activity   Alcohol  use: Yes    Alcohol /week: 2.0 standard drinks of alcohol     Types: 2 Cans of beer per week    Comment: rarely   Drug use: No   Sexual activity: Not Currently    Partners: Male  Other Topics Concern   Not on file  Social History Narrative   Patient lives at home with partner.   Caffeine Use: 32oz daily coffee/tea      Disability.    Social Drivers of Corporate investment banker Strain: Low Risk  (03/23/2024)   Overall Financial Resource Strain (CARDIA)    Difficulty of Paying Living Expenses: Not hard at all  Food Insecurity: No Food Insecurity (03/23/2024)   Hunger Vital Sign    Worried About Running Out of Food in the Last Year: Never true    Ran Out of Food in the Last Year: Never true  Transportation Needs: No  Transportation Needs (03/23/2024)   PRAPARE - Administrator, Civil Service (Medical): No    Lack of Transportation (Non-Medical): No  Physical Activity: Insufficiently Active (03/23/2024)   Exercise Vital Sign    Days of Exercise per Week: 3 days    Minutes of Exercise per Session: 30 min  Stress: No Stress Concern Present (03/23/2024)   [employer] of Occupational Health - Occupational Stress Questionnaire    Feeling of Stress : Not at all  Social Connections: Moderately Isolated (03/23/2024)   Social Connection and Isolation Panel [NHANES]    Frequency of Communication with Friends and Family: Twice a week    Frequency of Social Gatherings with Friends and Family: Twice a  week    Attends Religious Services: Never    Active Member of Clubs or Organizations: No    Attends Banker Meetings: Never    Marital Status: Living with partner  Intimate Partner Violence: Not At Risk (03/23/2024)   Humiliation, Afraid, Rape, and Kick questionnaire    Fear of Current or Ex-Partner: No    Emotionally Abused: No    Physically Abused: No    Sexually Abused: No    Outpatient Medications Prior to Visit  Medication Sig Dispense Refill   albuterol  (VENTOLIN  HFA) 108 (90 Base) MCG/ACT inhaler TAKE 2 PUFFS BY MOUTH EVERY 6 HOURS AS NEEDED FOR WHEEZE OR SHORTNESS OF BREATH 17 each 3   atorvastatin (LIPITOR) 20 MG tablet TAKE 1 TABLET BY MOUTH EVERY DAY 90 tablet 1   citalopram (CELEXA) 10 MG tablet Take 10 mg by mouth daily.     esomeprazole (NEXIUM) 40 MG capsule TAKE 1 CAPSULE BY MOUTH TWICE A DAY 180 capsule 1   fluticasone  (FLONASE ) 50 MCG/ACT nasal spray SPRAY 1 SPRAY INTO EACH NOSTRIL EVERY DAY AS NEEDED FOR ALLERGIES 48 mL 2   linaclotide  (LINZESS ) 72 MCG capsule TAKE 1 CAPSULE BY MOUTH EVERY DAY BEFORE BREAKFAST 90 capsule 1   ODEFSEY 200-25-25 MG TABS tablet Take 1 tablet by mouth every morning.     promethazine  (PHENERGAN ) 25 MG tablet TAKE 1 TABLET BY MOUTH EVERY 8  HOURS AS NEEDED FOR NAUSEA OR VOMITING. 30 tablet 1   valACYclovir (VALTREX) 500 MG tablet Take 1 tablet by mouth daily as needed (outbreaks).      amoxicillin-clavulanate (AUGMENTIN) 875-125 MG tablet Take 1 tablet by mouth 2 (two) times daily.     Oxycodone  HCl 20 MG TABS Take 1 tablet (20 mg total) by mouth 4 (four) times daily as needed (severe pain). 120 tablet 0   oxyCODONE -acetaminophen  (PERCOCET/ROXICET) 5-325 MG tablet Take 1-2 tablets by mouth every 8 (eight) hours as needed.     No facility-administered medications prior to visit.    Allergies  Allergen Reactions   Adhesive [Tape] Other (See Comments)    SEVERE IRRITAION   Ampicillin Nausea And Vomiting   Doxycycline Rash   Hydroxyzine Other (See Comments)    Nausea and itching   Lyrica [Pregabalin] Nausea And Vomiting and Rash    Review of Systems  Constitutional:  Positive for chills. Negative for appetite change, fatigue and fever.  HENT:  Negative for congestion, ear pain, sinus pressure and sore throat.   Eyes: Negative.   Respiratory:  Negative for cough, chest tightness, shortness of breath and wheezing.   Cardiovascular:  Negative for chest pain and palpitations.  Gastrointestinal:  Positive for nausea. Negative for abdominal pain, constipation, diarrhea and vomiting.  Endocrine: Negative.   Genitourinary:  Positive for frequency. Negative for dysuria and hematuria.  Musculoskeletal:  Positive for back pain. Negative for arthralgias, joint swelling and myalgias.  Skin:  Positive for wound (left leg cellulitis). Negative for rash.  Allergic/Immunologic: Negative.   Neurological:  Negative for dizziness, weakness and headaches.  Psychiatric/Behavioral:  Negative for dysphoric mood. The patient is not nervous/anxious.        Objective:        03/28/2024    3:51 PM 03/23/2024   10:06 AM 12/22/2023   10:45 AM  Vitals with BMI  Height 5\' 10"  5\' 10"  5\' 10"   Weight 202 lbs 10 oz 204 lbs 207 lbs  BMI 29.07 29.27  29.7  Systolic 128 128 833  Diastolic  72 72 74  Pulse 75 65 82    No data found.   Physical Exam Vitals reviewed.  Constitutional:      General: He is not in acute distress.    Appearance: Normal appearance. He is not ill-appearing.  Eyes:     Conjunctiva/sclera: Conjunctivae normal.  Cardiovascular:     Rate and Rhythm: Normal rate and regular rhythm.     Heart sounds: Normal heart sounds. No murmur heard. Pulmonary:     Effort: Pulmonary effort is normal.     Breath sounds: Normal breath sounds. No wheezing.  Abdominal:     General: Bowel sounds are normal.     Palpations: Abdomen is soft.     Tenderness: There is no abdominal tenderness.  Musculoskeletal:        General: Normal range of motion.  Skin:    General: Skin is warm.     Findings: Erythema and wound present. No signs of injury.     Comments: See photo, tender to touch  Neurological:     Mental Status: He is alert. Mental status is at baseline.  Psychiatric:        Mood and Affect: Mood normal.        Behavior: Behavior normal.     Health Maintenance Due  Topic Date Due   COVID-19 Vaccine (3 - Moderna risk series) 05/07/2020   Pneumococcal Vaccine 81-33 Years old (3 of 3 - PPSV23, PCV20 or PCV21) 03/27/2024   Medicare Annual Wellness (AWV)  04/22/2024    There are no preventive care reminders to display for this patient.   No results found for: "TSH" Lab Results  Component Value Date   WBC 7.6 03/23/2024   HGB 14.3 03/23/2024   HCT 42.4 03/23/2024   MCV 94 03/23/2024   PLT 345 03/23/2024   Lab Results  Component Value Date   NA 140 03/23/2024   K 5.3 (H) 03/23/2024   CO2 24 03/23/2024   GLUCOSE 79 03/23/2024   BUN 16 03/23/2024   CREATININE 1.15 03/23/2024   BILITOT 0.4 03/23/2024   ALKPHOS 78 03/23/2024   AST 16 03/23/2024   ALT 15 03/23/2024   PROT 6.7 03/23/2024   ALBUMIN 4.3 03/23/2024   CALCIUM 9.8 03/23/2024   ANIONGAP 10 11/11/2023   EGFR 73 03/23/2024   Lab Results   Component Value Date   CHOL 157 03/23/2024   Lab Results  Component Value Date   HDL 33 (L) 03/23/2024   Lab Results  Component Value Date   LDLCALC 108 (H) 03/23/2024   Lab Results  Component Value Date   TRIG 86 03/23/2024   Lab Results  Component Value Date   CHOLHDL 4.8 03/23/2024   No results found for: "HGBA1C"     Assessment & Plan:  Left leg cellulitis Assessment & Plan: Cellulitis of left leg Recurrent cellulitis, previously treated with antibiotics. Current symptoms include pain without fever or open wounds. Previous MRSA infection noted, but current episode not suspected to be MRSA. - Continue Augmentin for an additional 7 days. - Advise leg elevation to reduce swelling. - Instruct to call if symptoms do not improve for further evaluation and possible antibiotic change.  Orders: -     Amoxicillin-Pot Clavulanate; Take 1 tablet by mouth 2 (two) times daily for 7 days.  Dispense: 14 tablet; Refill: 0  Long-term current use of opiate analgesic Assessment & Plan: Chronic pain likely due to previous injury and degenerative changes. Managed with oxycodone . - Refill oxycodone   prescription.  Orders: -     oxyCODONE  HCl; Take 1 tablet (20 mg total) by mouth 4 (four) times daily as needed (severe pain).  Dispense: 120 tablet; Refill: 0  Family history of diabetes mellitus in mother Assessment & Plan: Strong family history of diabetes. Reports increased thirst and urination, raising concern for diabetes. - Order A1c test to evaluate for diabetes mellitus.   Orders: -     Hemoglobin A1c  Urinary frequency Assessment & Plan: Strong family history of diabetes. Reports increased thirst and urination, raising concern for diabetes. - Order A1c test to evaluate for diabetes mellitus.   Orders: -     Hemoglobin A1c     Meds ordered this encounter  Medications   amoxicillin-clavulanate (AUGMENTIN) 875-125 MG tablet    Sig: Take 1 tablet by mouth 2 (two) times  daily for 7 days.    Dispense:  14 tablet    Refill:  0   Oxycodone  HCl 20 MG TABS    Sig: Take 1 tablet (20 mg total) by mouth 4 (four) times daily as needed (severe pain).    Dispense:  120 tablet    Refill:  0    Duplicate.    Orders Placed This Encounter  Procedures   Hemoglobin A1c    Follow-up: Return in about 3 months (around 06/28/2024) for chronic.  An After Visit Summary was printed and given to the patient.  Delford Felling, FNP [facility] [phone]

## 2024-03-28 NOTE — Assessment & Plan Note (Signed)
 Strong family history of diabetes. Reports increased thirst and urination, raising concern for diabetes. - Order A1c test to evaluate for diabetes mellitus.

## 2024-03-28 NOTE — Assessment & Plan Note (Signed)
 Chronic pain likely due to previous injury and degenerative changes. Managed with oxycodone . - Refill oxycodone  prescription.

## 2024-03-28 NOTE — Assessment & Plan Note (Signed)
 Cellulitis of left leg Recurrent cellulitis, previously treated with antibiotics. Current symptoms include pain without fever or open wounds. Previous MRSA infection noted, but current episode not suspected to be MRSA. - Continue Augmentin for an additional 7 days. - Advise leg elevation to reduce swelling. - Instruct to call if symptoms do not improve for further evaluation and possible antibiotic change.

## 2024-03-28 NOTE — Telephone Encounter (Signed)
Reviewed  kc

## 2024-03-29 LAB — HEMOGLOBIN A1C
Est. average glucose Bld gHb Est-mCnc: 103 mg/dL
Hgb A1c MFr Bld: 5.2 % (ref 4.8–5.6)

## 2024-04-29 ENCOUNTER — Other Ambulatory Visit: Payer: Self-pay | Admitting: Family Medicine

## 2024-04-29 DIAGNOSIS — Z79891 Long term (current) use of opiate analgesic: Secondary | ICD-10-CM

## 2024-04-29 MED ORDER — OXYCODONE HCL 20 MG PO TABS: 20.0000 mg | ORAL_TABLET | Freq: Four times a day (QID) | ORAL | 0 refills | Status: DC | PRN

## 2024-04-29 NOTE — Telephone Encounter (Signed)
 Copied from CRM [id number]. Topic: Clinical - Medication Refill >> Apr 29, 2024 10:29 AM Felizardo Hotter wrote: Medication: Oxycodone  HCl 20 MG TABS  Has the patient contacted their pharmacy? Yes (Agent: If no, request that the patient contact the pharmacy for the refill. If patient does not wish to contact the pharmacy document the reason why and proceed with request.) (Agent: If yes, when and what did the pharmacy advise?)Pharmacy need PCP approval.  This is the patient's preferred pharmacy:  [id number] - [place], [place] - [address] AT [facility] [address] [place] [address] Phone: [phone] Fax: [phone]  Is this the correct pharmacy for this prescription? Yes If no, delete pharmacy and type the correct one.   Has the prescription been filled recently? Yes  Is the patient out of the medication? Yes  Has the patient been seen for an appointment in the last year OR does the patient have an upcoming appointment? Yes  Can we respond through MyChart? Yes  Agent: Please be advised that Rx refills may take up to 3 business days. We ask that you follow-up with your pharmacy.

## 2024-05-05 ENCOUNTER — Ambulatory Visit

## 2024-05-05 VITALS — Ht 70.0 in | Wt 202.0 lb

## 2024-05-05 DIAGNOSIS — Z0000 Encounter for general adult medical examination without abnormal findings: Secondary | ICD-10-CM

## 2024-05-05 NOTE — Progress Notes (Addendum)
 Subjective:   Zenith Lamphier is a 58 y.o. who presents for a Medicare Wellness preventive visit.  As a reminder, Annual Wellness Visits don't include a physical exam, and some assessments may be limited, especially if this visit is performed virtually. We may recommend an in-person follow-up visit with your provider if needed.  Visit Complete: Virtual I connected with  Raymon Caldron on 05/05/24 by a audio enabled telemedicine application and verified that I am speaking with the correct person using two identifiers.  Patient Location: Home  Provider Location: Home Office  I discussed the limitations of evaluation and management by telemedicine. The patient expressed understanding and agreed to proceed.  Vital Signs: Because this visit was a virtual/telehealth visit, some criteria may be missing or patient reported. Any vitals not documented were not able to be obtained and vitals that have been documented are patient reported.  VideoDeclined- This patient declined Librarian, academic. Therefore the visit was completed with audio only.  Persons Participating in Visit: Patient.  AWV Questionnaire: No: Patient Medicare AWV questionnaire was not completed prior to this visit.  Cardiac Risk Factors include: advanced age (>75men, >43 women);male gender;smoking/ tobacco exposure;hypertension     Objective:    Today's Vitals   05/05/24 1322  Weight: 202 lb (91.6 kg)  Height: 5' 10 (1.778 m)   Body mass index is 28.98 kg/m.     05/05/2024    1:35 PM 11/11/2023    3:32 PM 06/23/2018    6:38 PM 03/20/2015    1:11 PM 02/07/2015    3:01 PM 07/27/2014   10:38 AM  Advanced Directives  Does Patient Have a Medical Advance Directive? No No No  No  No  No   Would patient like information on creating a medical advance directive? Yes (MAU/Ambulatory/Procedural Areas - Information given)   Yes - Educational materials given  No - patient declined information   No - patient declined information      Data saved with a previous flowsheet row definition    Current Medications (verified) Outpatient Encounter Medications as of 05/05/2024  Medication Sig   albuterol  (VENTOLIN  HFA) 108 (90 Base) MCG/ACT inhaler TAKE 2 PUFFS BY MOUTH EVERY 6 HOURS AS NEEDED FOR WHEEZE OR SHORTNESS OF BREATH   atorvastatin (LIPITOR) 20 MG tablet TAKE 1 TABLET BY MOUTH EVERY DAY   citalopram (CELEXA) 10 MG tablet Take 10 mg by mouth daily.   esomeprazole (NEXIUM) 40 MG capsule TAKE 1 CAPSULE BY MOUTH TWICE A DAY   fluticasone  (FLONASE ) 50 MCG/ACT nasal spray SPRAY 1 SPRAY INTO EACH NOSTRIL EVERY DAY AS NEEDED FOR ALLERGIES   linaclotide  (LINZESS ) 72 MCG capsule TAKE 1 CAPSULE BY MOUTH EVERY DAY BEFORE BREAKFAST   ODEFSEY 200-25-25 MG TABS tablet Take 1 tablet by mouth every morning.   Oxycodone  HCl 20 MG TABS Take 1 tablet (20 mg total) by mouth 4 (four) times daily as needed (severe pain).   promethazine  (PHENERGAN ) 25 MG tablet TAKE 1 TABLET BY MOUTH EVERY 8 HOURS AS NEEDED FOR NAUSEA OR VOMITING.   valACYclovir (VALTREX) 500 MG tablet Take 1 tablet by mouth daily as needed (outbreaks).    No facility-administered encounter medications on file as of 05/05/2024.    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   Fecal urgency 09/15/2014   GERD (gastroesophageal reflux disease)     HIV (human immunodeficiency virus infection) (HCC)    Hyperlipidemia    Hypertension 03/29/2014   Internal hemorrhoids    Left hip pain 05/18/2022   Long-term current use of opiate analgesic 03/14/2020   Lumbar pain 05/18/2022   Mixed hyperlipidemia 03/29/2014   Neuropathy 06/18/2017   Opioid use 09/14/2022   Plantar fasciitis of right foot 01/04/2016   Postoperative examination 03/10/2016   Preoperative clearance 12/24/2022   Presence of tooth-root and mandibular implants 05/29/2020   Rash left leg-- using topical cream   Spondylosis with myelopathy, 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   Thrombosed external hemorrhoid 01/28/2016   Tobacco abuse 11/06/2016   Toenail deformity 05/31/2023   Past Surgical History:  Procedure Laterality Date   APPENDECTOMY  1981   CHOLECYSTECTOMY  2006   dental implants  11/2020   FOOT SURGERY     HAMMER TOE SURGERY  OCT 2010   INTERSTIM IMPLANT PLACEMENT  06/17/2012   Procedure: Simona [place] IMPLANT FIRST STAGE;  Surgeon: Devorah Fonder, MD;  Location: [facility] [place];  Service: Urology;  Laterality: N/A;  RAD TECH OK PER VICKIE AT MAIN OR    INTERSTIM IMPLANT PLACEMENT  06/17/2012   Procedure: INTERSTIM IMPLANT SECOND STAGE;  Surgeon: Devorah Fonder, MD;  Location: [facility];  Service: Urology;  Laterality: N/A;   MOUTH SURGERY  02/16/2020   REPAIR RECURRENT RIGHT INGUINAL HERNIA  2000   RIGHT INGUINAL HERNIA REPAIR  1997   UMBILICAL HERNIA REPAIR  2010   Family History  Problem Relation Age of Onset   Other Mother        colon resection, tumor near liver   Diabetes Mother    Hyperlipidemia Mother    Hypertension Mother    Varicose Veins Mother    Heart Problems Father    Diabetes Father    Heart disease Father    Hyperlipidemia Father    Hypertension Father    Pancreatic cancer Neg Hx    Rectal cancer Neg Hx    Stomach cancer Neg Hx    Colon cancer Neg Hx    Social History   Socioeconomic History   Marital  status: Significant Other    Spouse name: Marvella Slight   Number of children: 1   Years of education: College   Highest education level: Not on file  Occupational History    Employer: OTHER    Comment: disability  Tobacco Use   Smoking status: Former    Current packs/day: 0.00    Average packs/day: 1 pack/day for 30.0 years (30.0 ttl pk-yrs)    Types: Cigarettes    Start date: 06/11/1980    Quit date: 06/11/2010    Years since quitting: 13.9   Smokeless tobacco: Never  Vaping Use   Vaping status: Some Days  Substance and Sexual Activity   Alcohol  use: Yes    Alcohol /week: 2.0 standard drinks of alcohol     Types: 2 Cans of beer per week    Comment: rarely   Drug use: No   Sexual activity: Not Currently    Partners: Male  Other  Topics Concern   Not on file  Social History Narrative   Patient lives at home with partner.   Caffeine Use: 32oz daily coffee/tea      Disability.    Social Drivers of Corporate investment banker Strain: Low Risk  (05/05/2024)   Overall Financial Resource Strain (CARDIA)    Difficulty of Paying Living Expenses: Not hard at all  Food Insecurity: No Food Insecurity (05/05/2024)   Hunger Vital Sign    Worried About Running Out of Food in the Last Year: Never true    Ran Out of Food in the Last Year: Never true  Transportation Needs: No Transportation Needs (05/05/2024)   PRAPARE - Administrator, Civil Service (Medical): No    Lack of Transportation (Non-Medical): No  Physical Activity: Sufficiently Active (05/05/2024)   Exercise Vital Sign    Days of Exercise per Week: 5 days    Minutes of Exercise per Session: 30 min  Recent Concern: Physical Activity - Insufficiently Active (03/23/2024)   Exercise Vital Sign    Days of Exercise per Week: 3 days    Minutes of Exercise per Session: 30 min  Stress: No Stress Concern Present (05/05/2024)   [employer] of Occupational Health - Occupational Stress Questionnaire    Feeling of Stress:  Not at all  Social Connections: Moderately Isolated (05/05/2024)   Social Connection and Isolation Panel    Frequency of Communication with Friends and Family: Twice a week    Frequency of Social Gatherings with Friends and Family: Twice a week    Attends Religious Services: Never    Database administrator or Organizations: No    Attends Engineer, structural: Never    Marital Status: Living with partner    Tobacco Counseling Counseling given: Not Answered    Clinical Intake:  Pre-visit preparation completed: Yes  Pain : No/denies pain  Diabetes: No  Lab Results  Component Value Date   HGBA1C 5.2 03/28/2024     How often do you need to have someone help you when you read instructions, pamphlets, or other written materials from your doctor or pharmacy?: 1 - Never  Interpreter Needed?: No  Information entered by :: Seabron Cypress LPN   Activities of Daily Living     05/05/2024    1:30 PM  In your present state of health, do you have any difficulty performing the following activities:  Hearing? 0  Vision? 0  Difficulty concentrating or making decisions? 0  Walking or climbing stairs? 0  Dressing or bathing? 0  Doing errands, shopping? 0  Preparing Food and eating ? N  Using the Toilet? N  In the past six months, have you accidently leaked urine? N  Do you have problems with loss of bowel control? N  Managing your Medications? N  Managing your Finances? N  Housekeeping or managing your Housekeeping? N    Patient Care Team: Mercy Stall, MD as PCP - General (Internal Medicine) Cox, Nigel Bart, MD as Referring Physician (Infectious Diseases)  I have updated your Care Teams any recent Medical Services you may have received from other providers in the past year.     Assessment:   This is a routine wellness examination for [place].  Hearing/Vision screen Hearing Screening - Comments:: Denies hearing difficulties   Vision Screening - Comments:: No  vision problems; will schedule routine eye exam soon     Goals Addressed  This Visit's Progress    Remain active and maintain health   On track      Depression Screen     05/05/2024    1:33 PM 03/23/2024   10:08 AM 09/07/2023    1:32 PM 09/02/2023    2:40 PM 06/18/2023    1:27 PM 06/18/2023    1:22 PM 06/18/2023    1:20 PM  PHQ 2/9 Scores  PHQ - 2 Score 2 2 0  1 0 0  PHQ- 9 Score 9 9 4  3     Exception Documentation    Patient refusal       Fall Risk     05/05/2024    1:36 PM 09/07/2023    1:32 PM 04/23/2023    2:47 PM 09/08/2022   10:59 AM 05/13/2022   10:35 AM  Fall Risk   Falls in the past year? 0 0 0  0  Number falls in past yr: 0 0 0 0 0  Injury with Fall? 0 0 0 0 0  Risk for fall due to : No Fall Risks No Fall Risks No Fall Risks No Fall Risks No Fall Risks  Follow up Falls prevention discussed;Education provided;Falls evaluation completed Falls evaluation completed;Follow up appointment Falls evaluation completed Falls evaluation completed  Falls evaluation completed      Data saved with a previous flowsheet row definition    MEDICARE RISK AT HOME:  Medicare Risk at Home Any stairs in or around the home?: No If so, are there any without handrails?: No Home free of loose throw rugs in walkways, pet beds, electrical cords, etc?: Yes Adequate lighting in your home to reduce risk of falls?: Yes Life alert?: No Use of a cane, walker or w/c?: No Grab bars in the bathroom?: Yes Shower chair or bench in shower?: No Elevated toilet seat or a handicapped toilet?: Yes  TIMED UP AND GO:  Was the test performed?  No  Cognitive Function: 6CIT completed        05/05/2024    1:36 PM 04/23/2023    3:56 PM  6CIT Screen  What Year? 0 points 0 points  What month? 0 points 0 points  What time? 0 points 0 points  Count back from 20 0 points 0 points  Months in reverse 0 points 0 points  Repeat phrase 0 points 0 points  Total Score 0 points 0 points     Immunizations Immunization History  Administered Date(s) Administered   Influenza, Mdck, Trivalent,PF 6+ MOS(egg free) 09/07/2023   Influenza,inj,Quad PF,6+ Mos 11/19/2017, 10/25/2018, 01/28/2021, 09/30/2021   Influenza-Unspecified 09/24/2014   Moderna Sars-Covid-2 Vaccination 03/12/2020, 04/09/2020   Pneumococcal Conjugate-13 01/16/2016, 10/25/2018   Pneumococcal Polysaccharide-23 03/28/2019   Tdap 09/07/2014, 05/09/2019    Screening Tests Health Maintenance  Topic Date Due   Zoster Vaccines- Shingrix (1 of 2) Never done   COVID-19 Vaccine (3 - Moderna risk series) 05/07/2020   Pneumococcal Vaccine 73-42 Years old (3 of 3 - PPSV23, PCV20 or PCV21) 03/27/2024   INFLUENZA VACCINE  06/24/2024   Lung Cancer Screening  12/24/2024   Colonoscopy  02/06/2025   Medicare Annual Wellness (AWV)  05/05/2025   DTaP/Tdap/Td (3 - Td or Tdap) 05/08/2029   Hepatitis C Screening  Completed   HIV Screening  Completed   HPV VACCINES  Aged Out   Meningococcal B Vaccine  Aged Out    Health Maintenance  Health Maintenance Due  Topic Date Due   Zoster Vaccines- Shingrix (1 of 2) Never  done   COVID-19 Vaccine (3 - Moderna risk series) 05/07/2020   Pneumococcal Vaccine 48-11 Years old (3 of 3 - PPSV23, PCV20 or PCV21) 03/27/2024   Health Maintenance Items Addressed: Information provided on Shingrix and Prevnar   Additional Screening:  Vision Screening: Recommended annual ophthalmology exams for early detection of glaucoma and other disorders of the eye. Would you like a referral to an eye doctor? No    Dental Screening: Recommended annual dental exams for proper oral hygiene  Community Resource Referral / Chronic Care Management: CRR required this visit?  No   CCM required this visit?  No   Plan:    I have personally reviewed and noted the following in the patient's chart:   Medical and social history Use of alcohol , tobacco or illicit drugs  Current medications and  supplements including opioid prescriptions. Patient is not currently taking opioid prescriptions. Functional ability and status Nutritional status Physical activity Advanced directives List of other physicians Hospitalizations, surgeries, and ER visits in previous 12 months Vitals Screenings to include cognitive, depression, and falls Referrals and appointments  In addition, I have reviewed and discussed with patient certain preventive protocols, quality metrics, and best practice recommendations. A written personalized care plan for preventive services as well as general preventive health recommendations were provided to patient.   Seabron Cypress [place], [place]   5/78/4696   After Visit Summary: (MyChart) Due to this being a telephonic visit, the after visit summary with patients personalized plan was offered to patient via MyChart   Notes: PCP Follow Up Recommendations: Patient states that at time he has burning/ pulling sensation at site of previous hernia repair.  Possibly attributes to heavy lifting.

## 2024-05-05 NOTE — Patient Instructions (Signed)
 Mr. Kevin Pena , Thank you for taking time out of your busy schedule to complete your Annual Wellness Visit with me. I enjoyed our conversation and look forward to speaking with you again next year. I, as well as your care team,  appreciate your ongoing commitment to your health goals. Please review the following plan we discussed and let me know if I can assist you in the future. Your Game plan/ To Do List     Follow up Visits: Next Medicare AWV with our clinical staff: In 1 year    Have you seen your provider in the last 6 months (3 months if uncontrolled diabetes)? Yes Next Office Visit with your provider: 06/27/24 @ 9:20  Clinician Recommendations:  Aim for 30 minutes of exercise or brisk walking, 6-8 glasses of water , and 5 servings of fruits and vegetables each day.       This is a list of the screening recommended for you and due dates:  Health Maintenance  Topic Date Due   Zoster (Shingles) Vaccine (1 of 2) Never done   COVID-19 Vaccine (3 - Moderna risk series) 05/07/2020   Pneumococcal Vaccination (3 of 3 - PPSV23, PCV20 or PCV21) 03/27/2024   Flu Shot  06/24/2024   Screening for Lung Cancer  12/24/2024   Colon Cancer Screening  02/06/2025   Medicare Annual Wellness Visit  05/05/2025   DTaP/Tdap/Td vaccine (3 - Td or Tdap) 05/08/2029   Hepatitis C Screening  Completed   HIV Screening  Completed   HPV Vaccine  Aged Out   Meningitis B Vaccine  Aged Out    Advanced directives: (ACP Link)Information on Advanced Care Planning can be found at [place]  Secretary of [facility] Advance Health Care Directives Advance Health Care Directives. [email or web address]   Advance Care Planning is important because it:  [x]  Makes sure you receive the medical care that is consistent with your values, goals, and preferences  [x]  It provides guidance to your family and loved ones and reduces their decisional burden about whether or not they are making the right decisions based on your wishes.  Follow the  link provided in your after visit summary or read over the paperwork we have mailed to you to help you started getting your Advance Directives in place. If you need assistance in completing these, please reach out to us  so that we can help you!  See attachments for Preventive Care and Fall Prevention Tips.

## 2024-05-23 DIAGNOSIS — I10 Essential (primary) hypertension: Secondary | ICD-10-CM | POA: Diagnosis not present

## 2024-05-23 DIAGNOSIS — Z87891 Personal history of nicotine dependence: Secondary | ICD-10-CM | POA: Diagnosis not present

## 2024-05-23 DIAGNOSIS — Z79899 Other long term (current) drug therapy: Secondary | ICD-10-CM | POA: Diagnosis not present

## 2024-05-23 DIAGNOSIS — G4730 Sleep apnea, unspecified: Secondary | ICD-10-CM | POA: Diagnosis not present

## 2024-05-26 ENCOUNTER — Other Ambulatory Visit: Payer: Self-pay | Admitting: Family Medicine

## 2024-05-26 DIAGNOSIS — Z79891 Long term (current) use of opiate analgesic: Secondary | ICD-10-CM

## 2024-05-26 NOTE — Telephone Encounter (Signed)
 04/29/24 120 tabs/0 RF

## 2024-05-26 NOTE — Telephone Encounter (Signed)
 Copied from CRM [id number]. Topic: Clinical - Medication Refill >> May 26, 2024 11:19 AM Zebedee SAUNDERS wrote: Medication: Oxycodone  HCl 20 MG TABS  Has the patient contacted their pharmacy? Yes (Agent: If no, request that the patient contact the pharmacy for the refill. If patient does not wish to contact the pharmacy document the reason why and proceed with request.) (Agent: If yes, when and what did the pharmacy advise?)Pharmacy need PCP approval  This is the patient's preferred pharmacy:  [id number] - [place], [place] - [address] AT [address] [place] [address] Phone: [phone] Fax: [phone]  Is this the correct pharmacy for this prescription? Yes If no, delete pharmacy and type the correct one.   Has the prescription been filled recently? Yes  Is the patient out of the medication? Yes  Has the patient been seen for an appointment in the last year OR does the patient have an upcoming appointment? Yes  Can we respond through MyChart? Yes  Agent: Please be advised that Rx refills may take up to 3 business days. We ask that you follow-up with your pharmacy.

## 2024-05-30 MED ORDER — OXYCODONE HCL 20 MG PO TABS: 20.0000 mg | ORAL_TABLET | Freq: Four times a day (QID) | ORAL | 0 refills | Status: DC | PRN

## 2024-06-22 ENCOUNTER — Other Ambulatory Visit: Payer: Self-pay | Admitting: Family Medicine

## 2024-06-27 ENCOUNTER — Encounter: Payer: Self-pay | Admitting: Family Medicine

## 2024-06-27 ENCOUNTER — Ambulatory Visit (INDEPENDENT_AMBULATORY_CARE_PROVIDER_SITE_OTHER): Admitting: Family Medicine

## 2024-06-27 VITALS — BP 134/72 | HR 97 | Temp 98.1°F | Ht 70.0 in | Wt 208.0 lb

## 2024-06-27 DIAGNOSIS — E782 Mixed hyperlipidemia: Secondary | ICD-10-CM | POA: Diagnosis not present

## 2024-06-27 DIAGNOSIS — G90521 Complex regional pain syndrome I of right lower limb: Secondary | ICD-10-CM | POA: Diagnosis not present

## 2024-06-27 DIAGNOSIS — J301 Allergic rhinitis due to pollen: Secondary | ICD-10-CM | POA: Diagnosis not present

## 2024-06-27 DIAGNOSIS — Z79891 Long term (current) use of opiate analgesic: Secondary | ICD-10-CM | POA: Diagnosis not present

## 2024-06-27 DIAGNOSIS — K2100 Gastro-esophageal reflux disease with esophagitis, without bleeding: Secondary | ICD-10-CM

## 2024-06-27 DIAGNOSIS — Z21 Asymptomatic human immunodeficiency virus [HIV] infection status: Secondary | ICD-10-CM

## 2024-06-27 DIAGNOSIS — I10 Essential (primary) hypertension: Secondary | ICD-10-CM

## 2024-06-27 MED ORDER — TRIAMCINOLONE ACETONIDE 40 MG/ML IJ SUSP
60.0000 mg | Freq: Once | INTRAMUSCULAR | Status: AC
Start: 2024-06-27 — End: 2024-06-27
  Administered 2024-06-27: 60 mg via INTRAMUSCULAR

## 2024-06-27 MED ORDER — OXYCODONE HCL 20 MG PO TABS: 20.0000 mg | ORAL_TABLET | Freq: Four times a day (QID) | ORAL | 0 refills | Status: DC | PRN

## 2024-06-27 NOTE — Assessment & Plan Note (Signed)
Kenalog injection 80 mg IM  #1

## 2024-06-27 NOTE — Assessment & Plan Note (Signed)
 The current medical regimen is effective;  continue present plan and medications. Continue oxycodone  20 mg  four times a day.

## 2024-06-27 NOTE — Assessment & Plan Note (Signed)
-   Check UDS

## 2024-06-27 NOTE — Assessment & Plan Note (Signed)
 Management per specialist.

## 2024-06-27 NOTE — Assessment & Plan Note (Signed)
The current medical regimen is effective;  continue present plan and medications. Continue nexium 40 mg twice daily.  

## 2024-06-27 NOTE — Progress Notes (Signed)
 Subjective:  Patient ID: Kevin Pena, male    DOB: 18-May-1966  Age: 58 y.o. MRN: [id number]  Chief Complaint  Patient presents with   Medical Management of Chronic Issues    Discussed the use of AI scribe software for clinical note transcription with the patient, who gave verbal consent to proceed.  History of Present Illness   Kevin Pena is a 58 year old male who presents with allergy symptoms and requests a Kenalog  shot.  Allergic rhinitis and sinus symptoms - Persistent allergy symptoms despite use of over-the-counter medications including Allegra and Claritin, which have recently been ineffective - Requests Kenalog  injection, which he has received every six months in the past - Recent sinus infection self-treated with over-the-counter sinus medication - Congestion and occasional chills - No fevers or persistent chills - Uses Flonase  for allergies  Chronic pain syndrome/regional pain syndrome: On oxycodone  20 mg 4 times daily as needed. Complex regional pain syndrome 1 of lower right leg. Rates pain a 6/10, bur has not taken pain medicine because he drove here.    Insomnia/osa: Using CPAP. Had lost weight and improved. Has been over 5 years since sleep study.   Depression: on celexa 10 mg daily. Fairly well controlled.   HIV: On Odefsey one daily.  Per the patient his T-cell numbers are nondetectible. Sees ID doctor.   Hypertension: well managed without antihypertensives.   GERD: On Nexium 40 mg twice daily. Well controlled at twice daily.    Constipation: On Linzess  72 mcg every morning.   Hyperlipidemia: On Lipitor 20 mg nightly.   Eats fairly healthy. Not exercising.  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      06/27/2024    9:11 AM 05/05/2024    1:33 PM 03/23/2024   10:08 AM 09/07/2023    1:32 PM 06/18/2023    1:27 PM  Depression screen PHQ 2/9  Decreased Interest 0 1 1 0 0  Down, Depressed, Hopeless 0 1 1 0 1  PHQ - 2 Score 0 2 2 0 1  Altered sleeping 0 1 1 2 1   Tired, decreased energy 2 2 2 1  0  Change in appetite 2 2 2 1 1   Feeling bad or failure about yourself  0 1 1 0 0  Trouble concentrating 0 1 1 0 0  Moving slowly or fidgety/restless 0 0 0 0 0  Suicidal thoughts 0 0 0 0 0  PHQ-9 Score 4 9 9 4 3   Difficult doing work/chores Somewhat difficult Not difficult at all Somewhat difficult Somewhat difficult         06/27/2024    9:10 AM  Fall Risk   Falls in the past year? 1  Number falls in past yr: 0  Injury with Fall? 0  Risk for fall due to : No Fall Risks  Follow up Falls evaluation completed    Patient Care Team: [name], MD as PCP - General (Internal Medicine) Charnita Trudel, Arley HERO, MD as Referring Physician (Infectious Diseases)   Review of Systems  Constitutional:  Negative for chills, diaphoresis, fatigue and fever.  HENT:  Positive for congestion, rhinorrhea and sneezing. Negative for ear pain and sore throat.   Respiratory:  Negative for cough and shortness of breath.   Cardiovascular:  Negative for chest pain and leg swelling.  Gastrointestinal:  Negative for abdominal pain, constipation, diarrhea, nausea and vomiting.  Genitourinary:  Negative for dysuria and urgency.  Musculoskeletal:  Positive for arthralgias. Negative for myalgias.  Neurological:  Negative for dizziness and headaches.  Psychiatric/Behavioral:  Negative for dysphoric mood.     Current Outpatient Medications on File Prior to Visit  Medication Sig Dispense Refill   albuterol  (VENTOLIN   HFA) 108 (90 Base) MCG/ACT inhaler TAKE 2 PUFFS BY MOUTH EVERY 6 HOURS AS NEEDED FOR WHEEZE OR SHORTNESS OF BREATH 17 each 3   atorvastatin (LIPITOR) 20 MG tablet TAKE 1 TABLET BY MOUTH EVERY DAY 90 tablet 1   citalopram (CELEXA) 10 MG tablet Take 10 mg by mouth daily.     esomeprazole (NEXIUM) 40 MG capsule TAKE 1 CAPSULE BY MOUTH TWICE A DAY 180 capsule 1   fluticasone  (FLONASE ) 50 MCG/ACT nasal spray SPRAY 1 SPRAY INTO EACH NOSTRIL EVERY DAY AS NEEDED FOR ALLERGIES 48 mL 2   linaclotide  (LINZESS ) 72 MCG capsule TAKE 1 CAPSULE BY MOUTH EVERY DAY BEFORE BREAKFAST 90 capsule 1   ODEFSEY 200-25-25 MG TABS tablet Take 1 tablet by mouth every morning.     promethazine  (PHENERGAN ) 25 MG tablet TAKE 1 TABLET BY MOUTH EVERY 8 HOURS AS NEEDED FOR NAUSEA OR VOMITING. 30 tablet 1   valACYclovir (VALTREX) 500 MG tablet Take 1 tablet by mouth daily as needed (outbreaks).      No current facility-administered medications on file prior to visit.   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   Fecal urgency 09/15/2014   GERD (gastroesophageal reflux disease)    HIV (human immunodeficiency virus infection) (HCC)    Hyperlipidemia    Hypertension 03/29/2014   Internal hemorrhoids    Left hip pain 05/18/2022   Long-term current use of opiate analgesic 03/14/2020   Lumbar pain 05/18/2022   Mixed hyperlipidemia 03/29/2014   Neuropathy 06/18/2017   Opioid use 09/14/2022   Plantar fasciitis of right foot 01/04/2016    Postoperative examination 03/10/2016   Preoperative clearance 12/24/2022   Presence of tooth-root and mandibular implants 05/29/2020   Rash left leg-- using topical cream   Spondylosis with myelopathy, 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   Thrombosed external hemorrhoid 01/28/2016   Tobacco abuse 11/06/2016   Toenail deformity 05/31/2023   Past Surgical History:  Procedure Laterality Date   APPENDECTOMY  1981   CHOLECYSTECTOMY  2006   dental implants  11/2020   FOOT SURGERY     HAMMER TOE SURGERY  OCT 2010   INTERSTIM IMPLANT PLACEMENT  06/17/2012   Procedure: RENNA IMPLANT FIRST STAGE;  Surgeon: Glendia DELENA Elizabeth, MD;  Location: [facility];  Service: Urology;  Laterality: N/A;  RAD TECH OK PER VICKIE AT MAIN OR  INTERSTIM IMPLANT PLACEMENT  06/17/2012   Procedure: RENNA IMPLANT SECOND STAGE;  Surgeon: Glendia DELENA Elizabeth, MD;  Location: [facility];  Service: Urology;  Laterality: N/A;   MOUTH SURGERY  02/16/2020   REPAIR RECURRENT RIGHT INGUINAL HERNIA  2000   RIGHT INGUINAL HERNIA REPAIR  1997   UMBILICAL HERNIA REPAIR  2010    Family History  Problem Relation Age of Onset   Other Mother        colon resection, tumor near liver   Diabetes Mother    Hyperlipidemia Mother    Hypertension Mother    Varicose Veins Mother    Heart Problems Father    Diabetes Father    Heart disease Father    Hyperlipidemia Father    Hypertension Father    Pancreatic cancer Neg Hx    Rectal cancer Neg Hx    Stomach cancer Neg Hx    Colon cancer Neg Hx    Social History   Socioeconomic History   Marital status: Significant Other    Spouse name: Dempsey Code   Number of children: 1   Years of education: College   Highest education level: Not on file  Occupational History    Employer: OTHER    Comment: disability  Tobacco Use   Smoking status: Former    Current packs/day: 0.00    Average packs/day: 1 pack/day for 30.0 years (30.0 ttl pk-yrs)    Types:  Cigarettes    Start date: 06/11/1980    Quit date: 06/11/2010    Years since quitting: 14.0   Smokeless tobacco: Never  Vaping Use   Vaping status: Some Days  Substance and Sexual Activity   Alcohol  use: Yes    Alcohol /week: 2.0 standard drinks of alcohol     Types: 2 Cans of beer per week    Comment: rarely   Drug use: No   Sexual activity: Not Currently    Partners: Male  Other Topics Concern   Not on file  Social History Narrative   Patient lives at home with partner.   Caffeine Use: 32oz daily coffee/tea      Disability.    Social Drivers of Corporate investment banker Strain: Low Risk  (05/05/2024)   Overall Financial Resource Strain (CARDIA)    Difficulty of Paying Living Expenses: Not hard at all  Food Insecurity: No Food Insecurity (05/23/2024)   Received from [facility]   Hunger Vital Sign    Within the past 12 months, you worried that your food would run out before you got the money to buy more.: Never true    Within the past 12 months, the food you bought just didn't last and you didn't have money to get more.: Never true  Transportation Needs: No Transportation Needs (05/05/2024)   PRAPARE - Administrator, Civil Service (Medical): No    Lack of Transportation (Non-Medical): No  Physical Activity: Sufficiently Active (05/05/2024)   Exercise Vital Sign    Days of Exercise per Week: 5 days    Minutes of Exercise per Session: 30 min  Recent Concern: Physical Activity - Insufficiently Active (03/23/2024)   Exercise Vital Sign    Days of Exercise per Week: 3 days    Minutes of Exercise per Session: 30 min  Stress: No Stress Concern Present (05/05/2024)   [employer] of Occupational Health - Occupational Stress Questionnaire    Feeling of Stress: Not at all  Social Connections: Moderately Isolated (05/05/2024)  Social Advertising account executive    Frequency of Communication with Friends and Family: Twice a week    Frequency of  Social Gatherings with Friends and Family: Twice a week    Attends Religious Services: Never    Diplomatic Services operational officer: No    Attends Engineer, structural: Never    Marital Status: Living with partner    Objective:  BP 134/72   Pulse 97   Temp 98.1 F (36.7 C)   Ht 5' 10 (1.778 m)   Wt 208 lb (94.3 kg)   SpO2 (!) 60%   BMI 29.84 kg/m      06/27/2024    9:10 AM 05/05/2024    1:22 PM 03/28/2024    3:51 PM  BP/Weight  Systolic BP 134 -- 128  Diastolic BP 72 -- 72  Wt. (Lbs) 208 202 202.6  BMI 29.84 kg/m2 28.98 kg/m2 29.07 kg/m2    Physical Exam Constitutional:      Appearance: Normal appearance.  HENT:     Right Ear: Tympanic membrane, ear canal and external ear normal.     Left Ear: Tympanic membrane, ear canal and external ear normal.     Nose: Congestion and rhinorrhea present.     Mouth/Throat:     Mouth: Mucous membranes are moist.     Pharynx: No oropharyngeal exudate or posterior oropharyngeal erythema.  Neck:     Vascular: No carotid bruit.  Cardiovascular:     Rate and Rhythm: Normal rate and regular rhythm.     Heart sounds: Normal heart sounds.  Pulmonary:     Effort: Pulmonary effort is normal. No respiratory distress.     Breath sounds: Normal breath sounds. No wheezing, rhonchi or rales.  Abdominal:     General: Bowel sounds are normal.     Palpations: Abdomen is soft.     Tenderness: There is no abdominal tenderness.  Lymphadenopathy:     Cervical: No cervical adenopathy.  Neurological:     Mental Status: He is alert and oriented to person, place, and time.  Psychiatric:        Mood and Affect: Mood normal.        Behavior: Behavior normal.         Lab Results  Component Value Date   WBC 7.6 03/23/2024   HGB 14.3 03/23/2024   HCT 42.4 03/23/2024   PLT 345 03/23/2024   GLUCOSE 79 03/23/2024   CHOL 157 03/23/2024   TRIG 86 03/23/2024   HDL 33 (L) 03/23/2024   LDLCALC 108 (H) 03/23/2024   ALT 15 03/23/2024    AST 16 03/23/2024   NA 140 03/23/2024   K 5.3 (H) 03/23/2024   CL 102 03/23/2024   CREATININE 1.15 03/23/2024   BUN 16 03/23/2024   CO2 24 03/23/2024   HGBA1C 5.2 03/28/2024      Assessment & Plan:  Non-seasonal allergic rhinitis due to pollen Assessment & Plan: Kenalog  injection 80 mg IM  #1  Orders: -     Triamcinolone  Acetonide  Long-term current use of opiate analgesic Assessment & Plan: Check UDS.   Orders: -     oxyCODONE  HCl; Take 1 tablet (20 mg total) by mouth 4 (four) times daily as needed (severe pain).  Dispense: 120 tablet; Refill: 0 -     Pain Mgt Scrn (14 Drugs), Ur  Primary hypertension Assessment & Plan: Well controlled without on medicine.    Mixed hyperlipidemia Assessment & Plan: Well controlled.  No changes to medicines. Lipitor 20 mg nightly.  Continue to work on eating a healthy diet and exercise.     Complex regional pain syndrome type 1 of right lower extremity Assessment & Plan: The current medical regimen is effective;  continue present plan and medications. Continue oxycodone  20 mg  four times a day.    Gastroesophageal reflux disease with esophagitis without hemorrhage Assessment & Plan: The current medical regimen is effective;  continue present plan and medications. Continue nexium 40 mg twice daily.    Asymptomatic HIV infection, with no history of HIV-related illness [facility]) Assessment & Plan: Management per specialist.        Meds ordered this encounter  Medications   Oxycodone  HCl 20 MG TABS    Sig: Take 1 tablet (20 mg total) by mouth 4 (four) times daily as needed (severe pain).    Dispense:  120 tablet    Refill:  0    Duplicate.   triamcinolone  acetonide (KENALOG -40) injection 60 mg    Orders Placed This Encounter  Procedures   Pain Mgt Scrn (14 Drugs), Ur     Follow-up: Return in about 3 months (around 09/27/2024) for chronic follow up.   An After Visit Summary was printed and given to the  patient.  I attest that I have reviewed this visit and agree with the plan scribed by my staff.   Abigail Free, MD [facility] [phone]

## 2024-06-27 NOTE — Assessment & Plan Note (Addendum)
 Well controlled.  No changes to medicines. Lipitor 20 mg nightly.  Continue to work on eating a healthy diet and exercise.

## 2024-06-27 NOTE — Assessment & Plan Note (Addendum)
 Well controlled without on medicine.

## 2024-06-28 ENCOUNTER — Ambulatory Visit: Payer: Self-pay | Admitting: Family Medicine

## 2024-06-28 LAB — PAIN MGT SCRN (14 DRUGS), UR
Amphetamine Scrn, Ur: NEGATIVE ng/mL
BARBITURATE SCREEN URINE: NEGATIVE ng/mL
BENZODIAZEPINE SCREEN, URINE: NEGATIVE ng/mL
Buprenorphine, Urine: NEGATIVE ng/mL
CANNABINOIDS UR QL SCN: NEGATIVE ng/mL
Cocaine (Metab) Scrn, Ur: NEGATIVE ng/mL
Creatinine(Crt), U: 198.6 mg/dL (ref 20.0–300.0)
Fentanyl, Urine: NEGATIVE pg/mL
Meperidine Screen, Urine: NEGATIVE ng/mL
Methadone Screen, Urine: NEGATIVE ng/mL
OXYCODONE+OXYMORPHONE UR QL SCN: POSITIVE ng/mL — AB
Opiate Scrn, Ur: POSITIVE ng/mL — AB
Ph of Urine: 5.3 (ref 4.5–8.9)
Phencyclidine Qn, Ur: NEGATIVE ng/mL
Propoxyphene Scrn, Ur: NEGATIVE ng/mL
Tramadol Screen, Urine: NEGATIVE ng/mL

## 2024-07-20 ENCOUNTER — Other Ambulatory Visit: Payer: Self-pay | Admitting: Family Medicine

## 2024-07-27 ENCOUNTER — Other Ambulatory Visit: Payer: Self-pay | Admitting: Family Medicine

## 2024-07-27 DIAGNOSIS — Z79891 Long term (current) use of opiate analgesic: Secondary | ICD-10-CM

## 2024-07-27 NOTE — Telephone Encounter (Signed)
 06/27/24: 120 tabs/0 RF (30 day)

## 2024-07-27 NOTE — Telephone Encounter (Unsigned)
 Copied from CRM [id number]. Topic: Clinical - Medication Refill >> Jul 27, 2024 11:03 AM Zebedee SAUNDERS wrote: Medication: Oxycodone  HCl 20 MG TABS  Has the patient contacted their pharmacy? Yes (Agent: If no, request that the patient contact the pharmacy for the refill. If patient does not wish to contact the pharmacy document the reason why and proceed with request.) (Agent: If yes, when and what did the pharmacy advise?)  This is the patient's preferred pharmacy:  [id number] - [place], [place] - [address] AT [facility] [address] [place] [address] Phone: [phone] Fax: [phone]  Is this the correct pharmacy for this prescription? Yes If no, delete pharmacy and type the correct one.   Has the prescription been filled recently? Yes  Is the patient out of the medication? Yes  Has the patient been seen for an appointment in the last year OR does the patient have an upcoming appointment? Yes  Can we respond through MyChart? Yes  Agent: Please be advised that Rx refills may take up to 3 business days. We ask that you follow-up with your pharmacy.

## 2024-07-28 MED ORDER — OXYCODONE HCL 20 MG PO TABS: 20.0000 mg | ORAL_TABLET | Freq: Four times a day (QID) | ORAL | 0 refills | Status: DC | PRN

## 2024-07-29 ENCOUNTER — Telehealth: Payer: Self-pay

## 2024-07-29 NOTE — Telephone Encounter (Signed)
 Copied from CRM [id number]. Topic: Clinical - Medication Question >> Jul 29, 2024  8:47 AM Delon HERO wrote: Reason for CRM: Larnell is calling regarding receipt of two scripts for Oxycodone  HCl 20 MG TABS [[id number]]. Received duplicate script yesterday. Would like to know what he is do with the duplicate script. First script 05/30/24- last ffill. Second script sent 07/28/24 07:54p

## 2024-08-15 ENCOUNTER — Ambulatory Visit: Payer: Self-pay

## 2024-08-15 DIAGNOSIS — K625 Hemorrhage of anus and rectum: Secondary | ICD-10-CM | POA: Diagnosis not present

## 2024-08-15 DIAGNOSIS — I10 Essential (primary) hypertension: Secondary | ICD-10-CM | POA: Diagnosis not present

## 2024-08-15 DIAGNOSIS — N189 Chronic kidney disease, unspecified: Secondary | ICD-10-CM | POA: Diagnosis not present

## 2024-08-15 NOTE — Telephone Encounter (Signed)
 FYI Only or Action Required?: FYI only for provider.  Patient was last seen in primary care on 06/27/2024 by [name], MD.  Called Nurse Triage reporting Rectal Bleeding.  Symptoms began about a month ago.  Interventions attempted: Nothing.  Symptoms are: gradually worsening.  Triage Disposition: Go to ED Now (Notify PCP)  Patient/caregiver understands and will follow disposition?: Yes     Copied from CRM [id number]. Topic: Clinical - Red Word Triage >> Aug 15, 2024  2:54 PM Kevin Pena wrote: Red Word that prompted transfer to Nurse Triage: bright/dark red blood coming from bottom and also has pain like a spasm feeling. Reason for Disposition  [1] MODERATE rectal bleeding (e.g., small blood clots, passing blood without stool, or toilet water  turns red) AND [2] more than once a day  Answer Assessment - Initial Assessment Questions 1. APPEARANCE of BLOOD: What color is it? Is it passed separately, on the surface of the stool, or mixed in with the stool?     Dark red blood, sometimes it bright red 2. AMOUNT: How much blood was passed?      States in streaks,  3. FREQUENCY: How many times has blood been passed with the stools?      Every time he has a bm 4. ONSET: When was the blood first seen in the stools? (Days or weeks)      A month 5. DIARRHEA: Is there also some diarrhea? If Yes, ask: How many diarrhea stools in the past 24 hours?      Both constipation and loose, on linzess  6. CONSTIPATION: Do you have constipation? If Yes, ask: How bad is it?     See above 7. RECURRENT SYMPTOMS: Have you had blood in your stools before? If Yes, ask: When was the last time? and What happened that time?      Yes had polyps 8. BLOOD THINNERS: Do you take any blood thinners? (e.g., aspirin, clopidogrel / Plavix, coumadin, heparin). Notes: Other strong blood thinners include: Arixtra (fondaparinux), Eliquis (apixaban), Pradaxa (dabigatran), and Xarelto (rivaroxaban).      denies 9. OTHER SYMPTOMS: Do you have any other symptoms?  (e.g., abdomen pain, vomiting, dizziness, fever)     Weakness, fatigue, rectal pain 10. PREGNANCY: Is there any chance you are pregnant? When was your last menstrual period?       na  Protocols used: Rectal Bleeding-A-AH

## 2024-08-16 DIAGNOSIS — K625 Hemorrhage of anus and rectum: Secondary | ICD-10-CM | POA: Diagnosis not present

## 2024-08-16 DIAGNOSIS — N189 Chronic kidney disease, unspecified: Secondary | ICD-10-CM | POA: Diagnosis not present

## 2024-08-16 DIAGNOSIS — I10 Essential (primary) hypertension: Secondary | ICD-10-CM | POA: Diagnosis not present

## 2024-08-17 ENCOUNTER — Other Ambulatory Visit (INDEPENDENT_AMBULATORY_CARE_PROVIDER_SITE_OTHER)

## 2024-08-17 ENCOUNTER — Ambulatory Visit (INDEPENDENT_AMBULATORY_CARE_PROVIDER_SITE_OTHER): Admitting: Physician Assistant

## 2024-08-17 ENCOUNTER — Encounter: Payer: Self-pay | Admitting: Physician Assistant

## 2024-08-17 VITALS — BP 120/88 | HR 67 | Ht 70.0 in | Wt 207.0 lb

## 2024-08-17 DIAGNOSIS — R1013 Epigastric pain: Secondary | ICD-10-CM

## 2024-08-17 DIAGNOSIS — K921 Melena: Secondary | ICD-10-CM

## 2024-08-17 DIAGNOSIS — K5909 Other constipation: Secondary | ICD-10-CM

## 2024-08-17 DIAGNOSIS — K219 Gastro-esophageal reflux disease without esophagitis: Secondary | ICD-10-CM | POA: Diagnosis not present

## 2024-08-17 DIAGNOSIS — R140 Abdominal distension (gaseous): Secondary | ICD-10-CM

## 2024-08-17 DIAGNOSIS — Z21 Asymptomatic human immunodeficiency virus [HIV] infection status: Secondary | ICD-10-CM

## 2024-08-17 DIAGNOSIS — K922 Gastrointestinal hemorrhage, unspecified: Secondary | ICD-10-CM | POA: Diagnosis not present

## 2024-08-17 LAB — COMPREHENSIVE METABOLIC PANEL WITH GFR
ALT: 15 U/L (ref 0–53)
AST: 18 U/L (ref 0–37)
Albumin: 4.3 g/dL (ref 3.5–5.2)
Alkaline Phosphatase: 69 U/L (ref 39–117)
BUN: 25 mg/dL — ABNORMAL HIGH (ref 6–23)
CO2: 31 meq/L (ref 19–32)
Calcium: 9.4 mg/dL (ref 8.4–10.5)
Chloride: 102 meq/L (ref 96–112)
Creatinine, Ser: 1.37 mg/dL (ref 0.40–1.50)
GFR: 56.1 mL/min — ABNORMAL LOW (ref >60.00)
Glucose, Bld: 86 mg/dL (ref 70–99)
Potassium: 4.5 meq/L (ref 3.5–5.1)
Sodium: 138 meq/L (ref 135–145)
Total Bilirubin: 0.5 mg/dL (ref 0.2–1.2)
Total Protein: 7.1 g/dL (ref 6.0–8.3)

## 2024-08-17 LAB — CBC WITH DIFFERENTIAL/PLATELET
Basophils Absolute: 0 K/uL (ref 0.0–0.1)
Basophils Relative: 0.4 % (ref 0.0–3.0)
Eosinophils Absolute: 0.1 K/uL (ref 0.0–0.7)
Eosinophils Relative: 0.8 % (ref 0.0–5.0)
HCT: 45.2 % (ref 39.0–52.0)
Hemoglobin: 15 g/dL (ref 13.0–17.0)
Lymphocytes Relative: 31.7 % (ref 12.0–46.0)
Lymphs Abs: 2.7 K/uL (ref 0.7–4.0)
MCHC: 33.3 g/dL (ref 30.0–36.0)
MCV: 93.6 fl (ref 78.0–100.0)
Monocytes Absolute: 0.9 K/uL (ref 0.1–1.0)
Monocytes Relative: 10.4 % (ref 3.0–12.0)
Neutro Abs: 4.9 K/uL (ref 1.4–7.7)
Neutrophils Relative %: 56.7 % (ref 43.0–77.0)
Platelets: 213 K/uL (ref 150.0–400.0)
RBC: 4.83 Mil/uL (ref 4.22–5.81)
RDW: 14.1 % (ref 11.5–15.5)
WBC: 8.6 K/uL (ref 4.0–10.5)

## 2024-08-17 MED ORDER — NA SULFATE-K SULFATE-MG SULF 17.5-3.13-1.6 GM/177ML PO SOLN: 1.0000 | Freq: Once | ORAL | 0 refills | Status: AC

## 2024-08-17 NOTE — Patient Instructions (Addendum)
 Your provider has requested that you go to the basement level for lab work before leaving today. Press "B" on the elevator. The lab is located at the first door on the left as you exit the elevator.  You have been scheduled for an endoscopy and colonoscopy. Please follow the written instructions given to you at your visit today.  If you use inhalers (even only as needed), please bring them with you on the day of your procedure.  DO NOT TAKE 7 DAYS PRIOR TO TEST- Trulicity (dulaglutide) Ozempic, Wegovy (semaglutide) Mounjaro (tirzepatide) Bydureon Bcise (exanatide extended release)  DO NOT TAKE 1 DAY PRIOR TO YOUR TEST Rybelsus (semaglutide) Adlyxin (lixisenatide) Victoza (liraglutide) Byetta (exanatide) ___________________________________________________________________________

## 2024-08-17 NOTE — Progress Notes (Signed)
 Chief Complaint: GI bleeding  HPI:    Mr. Kevin Pena is a 58 year old male with a past medical history as listed below including chronic constipation, 08/05/2023 echo stress test was normal with LVEF 60%, chronic narcotic dependence, HIV and multiple others, previously known to Dr. [name], who was referred to me by Kevin Clapper, MD for a complaint of the GI bleeding.    12/12/2014 EGD for nausea and vomiting was normal.    02/07/2015 screening colonoscopy is normal.  Repeat recommended in 10 years.    05/23/2024 normal CBC with a hemoglobin of 14.5.  CMP with a creatinine of 1.4 and albumin minimally decreased at 3.4 and otherwise normal.  HIV by PCR not detected.    08/15/2024 CT of the abdomen pelvis showed no acute abnormality to the abdomen or pelvis.  No appreciable thickening of the colon or obstructive pathology.    Today, the patient tells me that he has had some maroon and bright red blood mixed in with his stool every day for the past month.  Apparently it was heavy for about a week, but since Monday he has not seen any further at all.  Tells me that at first this would come on even without a bowel movement and was typically at least once or twice a day.  He went to the [facility] for further evaluation they did a rectal exam and did not see anything.  Also discussed the CT above.  Apparently did labs as well (requesting those today) and he was told they were okay.  He does have chronic constipation which is resolved with Linzess  72 mcg daily.  Tells me this keeps his stools normal and those have not changed.  He does occasionally feel some swelling in his rectum like a bubble, but nothing recently.    Along with the above also feels bloated and full at times.  He does take Nexium 40 mg twice a day and has been on this for years.  He is worried there may be something going on in his stomach as well.    Denies fever, chills or weight loss.  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   Fecal urgency 09/15/2014   GERD (gastroesophageal reflux disease)    HIV (human immunodeficiency virus infection) (HCC)    Hyperlipidemia    Hypertension 03/29/2014   Internal hemorrhoids    Left hip pain 05/18/2022   Long-term current use of opiate analgesic 03/14/2020   Lumbar pain 05/18/2022   Mixed hyperlipidemia 03/29/2014   Neuropathy 06/18/2017   Opioid use 09/14/2022   Plantar fasciitis of right foot 01/04/2016   Postoperative examination 03/10/2016   Preoperative clearance 12/24/2022   Presence of tooth-root and mandibular implants 05/29/2020   Rash left leg-- using topical cream   Spondylosis with myelopathy, 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   Thrombosed external hemorrhoid 01/28/2016   Tobacco abuse 11/06/2016   Toenail deformity 05/31/2023    Past Surgical History:  Procedure Laterality Date   APPENDECTOMY  1981   CHOLECYSTECTOMY  2006   dental implants  11/2020   FOOT SURGERY     HAMMER TOE SURGERY  OCT 2010   INTERSTIM IMPLANT PLACEMENT  06/17/2012   Procedure: RENNA IMPLANT FIRST STAGE;  Surgeon: Glendia DELENA Elizabeth, MD;  Location: [facility];  Service: Urology;  Laterality: N/A;  RAD TECH OK PER VICKIE AT MAIN OR    INTERSTIM IMPLANT PLACEMENT  06/17/2012   Procedure: INTERSTIM IMPLANT SECOND STAGE;  Surgeon: Glendia DELENA Elizabeth, MD;  Location: [facility];  Service: Urology;  Laterality: N/A;   MOUTH SURGERY  02/16/2020   REPAIR RECURRENT RIGHT INGUINAL  HERNIA  2000   RIGHT INGUINAL HERNIA REPAIR  1997   UMBILICAL HERNIA REPAIR  2010    Current Outpatient Medications  Medication Sig Dispense Refill   albuterol  (VENTOLIN  HFA) 108 (90 Base) MCG/ACT inhaler TAKE 2 PUFFS BY MOUTH EVERY 6 HOURS AS NEEDED FOR WHEEZE OR SHORTNESS OF BREATH 17 each 3   atorvastatin (LIPITOR) 20 MG tablet TAKE 1 TABLET BY MOUTH EVERY DAY 90 tablet 1   citalopram (CELEXA) 10 MG tablet Take 10 mg by mouth daily.     esomeprazole (NEXIUM) 40 MG capsule TAKE 1 CAPSULE BY MOUTH TWICE A DAY 180 capsule 1   fluticasone  (FLONASE ) 50 MCG/ACT nasal spray SPRAY 1 SPRAY INTO EACH NOSTRIL EVERY DAY AS NEEDED FOR ALLERGIES 48 mL 2   linaclotide  (LINZESS ) 72 MCG capsule TAKE 1 CAPSULE BY MOUTH EVERY DAY BEFORE BREAKFAST 90 capsule 1   ODEFSEY 200-25-25 MG TABS tablet Take 1 tablet by mouth every morning.     Oxycodone  HCl 20 MG TABS Take 1 tablet (20 mg total) by mouth 4 (four) times daily as needed (severe pain). 120 tablet 0   promethazine  (PHENERGAN ) 25 MG tablet TAKE 1 TABLET BY MOUTH EVERY 8 HOURS AS NEEDED FOR NAUSEA OR VOMITING. 30 tablet 1   valACYclovir (VALTREX) 500 MG tablet Take 1 tablet by mouth daily as needed (outbreaks).      No current facility-administered medications for this visit.    Allergies as of 08/17/2024 - Review Complete 08/17/2024  Allergen Reaction Noted   Adhesive [tape] Other (See Comments) 06/11/2012   Ampicillin Nausea And Vomiting 06/11/2012   Doxycycline Rash 02/09/2012   Hydroxyzine Other (See Comments) 03/29/2014   Lyrica [pregabalin] Nausea And Vomiting and Rash 02/09/2012    Family History  Problem Relation Age of Onset   Other Mother        colon resection, tumor near liver   Diabetes Mother    Hyperlipidemia Mother    Hypertension Mother    Varicose Veins Mother    Heart Problems Father    Diabetes Father    Heart disease Father    Hyperlipidemia Father    Hypertension Father    Pancreatic cancer Neg Hx    Rectal cancer  Neg Hx    Stomach cancer Neg Hx    Colon cancer Neg Hx     Social History   Socioeconomic History   Marital status: Significant Other    Spouse name: Dempsey Code   Number of children: 1   Years of education: College   Highest education level: Not on file  Occupational History    Employer: OTHER    Comment: disability  Tobacco Use   Smoking status: Former    Current packs/day: 0.00    Average packs/day: 1 pack/day for 30.0 years (30.0 ttl pk-yrs)    Types: Cigarettes    Start date:  06/11/1980    Quit date: 06/11/2010    Years since quitting: 14.1   Smokeless tobacco: Never  Vaping Use   Vaping status: Some Days  Substance and Sexual Activity   Alcohol  use: Yes    Alcohol /week: 2.0 standard drinks of alcohol     Types: 2 Cans of beer per week    Comment: rarely   Drug use: No   Sexual activity: Not Currently    Partners: Male  Other Topics Concern   Not on file  Social History Narrative   Patient lives at home with partner.   Caffeine Use: 32oz daily coffee/tea      Disability.    Social Drivers of Corporate investment banker Strain: Low Risk  (05/05/2024)   Overall Financial Resource Strain (CARDIA)    Difficulty of Paying Living Expenses: Not hard at all  Food Insecurity: No Food Insecurity (05/23/2024)   Received from [facility]   Hunger Vital Sign    Within the past 12 months, you worried that your food would run out before you got the money to buy more.: Never true    Within the past 12 months, the food you bought just didn't last and you didn't have money to get more.: Never true  Transportation Needs: No Transportation Needs (05/05/2024)   PRAPARE - Administrator, Civil Service (Medical): No    Lack of Transportation (Non-Medical): No  Physical Activity: Sufficiently Active (05/05/2024)   Exercise Vital Sign    Days of Exercise per Week: 5 days    Minutes of Exercise per Session: 30 min  Recent Concern: Physical Activity -  Insufficiently Active (03/23/2024)   Exercise Vital Sign    Days of Exercise per Week: 3 days    Minutes of Exercise per Session: 30 min  Stress: No Stress Concern Present (05/05/2024)   [employer] of Occupational Health - Occupational Stress Questionnaire    Feeling of Stress: Not at all  Social Connections: Moderately Isolated (05/05/2024)   Social Connection and Isolation Panel    Frequency of Communication with Friends and Family: Twice a week    Frequency of Social Gatherings with Friends and Family: Twice a week    Attends Religious Services: Never    Database administrator or Organizations: No    Attends Banker Meetings: Never    Marital Status: Living with partner  Intimate Partner Violence: Not At Risk (05/05/2024)   Humiliation, Afraid, Rape, and Kick questionnaire    Fear of Current or Ex-Partner: No    Emotionally Abused: No    Physically Abused: No    Sexually Abused: No    Review of Systems:    Constitutional: No weight loss, fever or chills Skin: No rash  Cardiovascular: No chest pain Respiratory: No SOB Gastrointestinal: See HPI and otherwise negative Genitourinary: No dysuria Neurological: No headache, dizziness or syncope Musculoskeletal: No new muscle or joint pain Hematologic: No bruising Psychiatric: No history of depression or anxiety   Physical Exam:  Vital signs: BP 120/88   Pulse 67   Ht 5' 10 (1.778 m)   Wt 207 lb (93.9 kg)   BMI 29.70 kg/m    Constitutional:   Pleasant Caucasian male appears to be in NAD, Well developed, Well nourished, alert and cooperative Head:  Normocephalic and atraumatic. Eyes:   PEERL, EOMI. No icterus. Conjunctiva pink. Ears:  Normal auditory acuity. Neck:  Supple Throat: Oral cavity and pharynx without inflammation, swelling or lesion.  Respiratory: Respirations even and unlabored. Lungs clear to auscultation bilaterally.   No wheezes, crackles, or rhonchi.  Cardiovascular: Normal S1, S2. No MRG.  Regular rate and rhythm. No peripheral edema, cyanosis or pallor.  Gastrointestinal:  Soft, nondistended, nontender. No rebound or guarding. Normal bowel sounds. No appreciable masses or hepatomegaly. Rectal:  Declined Msk:  Symmetrical without gross deformities. Without edema, no deformity or joint abnormality.  Neurologic:  Alert and  oriented x4;  grossly normal neurologically.  Skin:   Dry and intact without significant lesions or rashes. Psychiatric: Demonstrates good judgement and reason without abnormal affect or behaviors.  RELEVANT LABS AND IMAGING: CBC    Component Value Date/Time   WBC 7.6 03/23/2024 1108   WBC 7.8 11/11/2023 1545   RBC 4.52 03/23/2024 1108   RBC 4.59 11/11/2023 1545   HGB 14.3 03/23/2024 1108   HCT 42.4 03/23/2024 1108   PLT 345 03/23/2024 1108   MCV 94 03/23/2024 1108   MCH 31.6 03/23/2024 1108   MCH 32.9 11/11/2023 1545   MCHC 33.7 03/23/2024 1108   MCHC 34.4 11/11/2023 1545   RDW 12.4 03/23/2024 1108   LYMPHSABS 2.5 03/23/2024 1108   MONOABS 1.1 (H) 02/09/2012 2315   EOSABS 0.1 03/23/2024 1108   BASOSABS 0.1 03/23/2024 1108    CMP     Component Value Date/Time   NA 140 03/23/2024 1108   K 5.3 (H) 03/23/2024 1108   CL 102 03/23/2024 1108   CO2 24 03/23/2024 1108   GLUCOSE 79 03/23/2024 1108   GLUCOSE 97 11/11/2023 1545   BUN 16 03/23/2024 1108   CREATININE 1.15 03/23/2024 1108   CALCIUM 9.8 03/23/2024 1108   PROT 6.7 03/23/2024 1108   ALBUMIN 4.3 03/23/2024 1108   AST 16 03/23/2024 1108   ALT 15 03/23/2024 1108   ALKPHOS 78 03/23/2024 1108   BILITOT 0.4 03/23/2024 1108   GFRNONAA >60 11/11/2023 1545   GFRAA 64 12/25/2020 1034    Assessment: 1.  GI bleed: Patient describes it for the past month he seen either bright red or maroon-colored blood mixed in with his stool, initially this was quite heavy, even up into the last week, but he has not seen any further over the past 48 hours, did not always, with a bowel movement, went to  [facility], CT unrevealing, labs per patient unrevealing we are requesting them today and rectal exam with no finding, last colonoscopy in 2016 was normal with repeat recommended in 10 years; consider diverticular bleed versus AVM versus rectal source 2.  Bloating and epigastric fullness with GERD: Chronic reflux typically controlled on Nexium 40 mg twice a day which he has been on for years, last EGD in 2016, now with some new bloating and decreased appetite; consider gastritis +/- GERD 3.  HIV: Last PCR in June showed undetectable 4.  Chronic constipation: With Linzess  72 mcg 5.  Opioid dependence: On Oxycodone   Plan: 1.  Scheduled patient for diagnostic EGD and colonoscopy with Dr. Legrand in the [facility].  Did provide the patient with a detailed list of risks for the procedures and he agrees to proceed. Patient is appropriate for endoscopic procedure(s) in the ambulatory ([facility]) setting.  2.  Patient will have a 2-day bowel prep given chronic constipation and history of opioid dependence 3.  Continue Linzess  72 mcg daily 4.  Continue Nexium 40 mg twice daily 5.  Requesting recent labs done at [facility] 6.  Repeat CBC, CMP and iron studies today 7.  Patient to  follow in clinic per recommendations after labs and procedures above.  Delon Failing, PA-C [facility] 08/17/2024, 1:41 PM  Cc: Kevin Clapper, MD

## 2024-08-18 ENCOUNTER — Ambulatory Visit: Payer: Self-pay | Admitting: Physician Assistant

## 2024-08-18 NOTE — Progress Notes (Signed)
 ____________________________________________________________  Attending physician addendum:  Thank you for sending this case to me. I have reviewed the entire note and agree with the plan.   Victory Brand, MD  ____________________________________________________________

## 2024-08-19 ENCOUNTER — Encounter: Payer: Self-pay | Admitting: Gastroenterology

## 2024-08-25 ENCOUNTER — Other Ambulatory Visit: Payer: Self-pay | Admitting: Family Medicine

## 2024-08-25 DIAGNOSIS — Z79891 Long term (current) use of opiate analgesic: Secondary | ICD-10-CM

## 2024-08-25 NOTE — Telephone Encounter (Unsigned)
 Copied from CRM [id number]. Topic: Clinical - Medication Refill >> Aug 25, 2024 10:55 AM Tysheama G wrote: Medication: Oxycodone  HCl 20 MG TABS  Has the patient contacted their pharmacy? No (Agent: If no, request that the patient contact the pharmacy for the refill. If patient does not wish to contact the pharmacy document the reason why and proceed with request.) (Agent: If yes, when and what did the pharmacy advise?)  This is the patient's preferred pharmacy:  [id number] - [place], [place] - [address] AT [facility] [address] [place] [address] Phone: [phone] Fax: [phone]  Is this the correct pharmacy for this prescription? Yes If no, delete pharmacy and type the correct one.   Has the prescription been filled recently? No  Is the patient out of the medication? No  Has the patient been seen for an appointment in the last year OR does the patient have an upcoming appointment? Yes  Can we respond through MyChart? Yes  Agent: Please be advised that Rx refills may take up to 3 business days. We ask that you follow-up with your pharmacy.

## 2024-08-26 ENCOUNTER — Encounter: Payer: Self-pay | Admitting: Gastroenterology

## 2024-08-26 ENCOUNTER — Ambulatory Visit: Admitting: Gastroenterology

## 2024-08-26 VITALS — BP 103/57 | HR 63 | Temp 97.3°F | Resp 17 | Ht 70.0 in | Wt 207.0 lb

## 2024-08-26 DIAGNOSIS — K2950 Unspecified chronic gastritis without bleeding: Secondary | ICD-10-CM

## 2024-08-26 DIAGNOSIS — K3189 Other diseases of stomach and duodenum: Secondary | ICD-10-CM

## 2024-08-26 DIAGNOSIS — K2951 Unspecified chronic gastritis with bleeding: Secondary | ICD-10-CM | POA: Diagnosis not present

## 2024-08-26 DIAGNOSIS — R1013 Epigastric pain: Secondary | ICD-10-CM

## 2024-08-26 DIAGNOSIS — K449 Diaphragmatic hernia without obstruction or gangrene: Secondary | ICD-10-CM | POA: Diagnosis not present

## 2024-08-26 DIAGNOSIS — K921 Melena: Secondary | ICD-10-CM

## 2024-08-26 DIAGNOSIS — K648 Other hemorrhoids: Secondary | ICD-10-CM | POA: Diagnosis not present

## 2024-08-26 DIAGNOSIS — K649 Unspecified hemorrhoids: Secondary | ICD-10-CM

## 2024-08-26 DIAGNOSIS — K5730 Diverticulosis of large intestine without perforation or abscess without bleeding: Secondary | ICD-10-CM | POA: Diagnosis not present

## 2024-08-26 MED ORDER — OXYCODONE HCL 20 MG PO TABS: 20.0000 mg | ORAL_TABLET | Freq: Four times a day (QID) | ORAL | 0 refills | Status: DC | PRN

## 2024-08-26 MED ORDER — SODIUM CHLORIDE 0.9 % IV SOLN: 500.0000 mL | Freq: Once | INTRAVENOUS | Status: DC

## 2024-08-26 NOTE — Progress Notes (Signed)
 A/o x 3, VSS, good SR's, pleased with anesthesia, report to RN

## 2024-08-26 NOTE — Progress Notes (Signed)
 Called to room to assist during endoscopic procedure.  Patient ID and intended procedure confirmed with present staff. Received instructions for my participation in the procedure from the performing physician.

## 2024-08-26 NOTE — Op Note (Signed)
 [facility] Patient Name: Kevin Pena Procedure Date: 08/26/2024 3:08 PM MRN: [id number] Endoscopist: Victory L. Legrand , MD, [id number] Age: 58 Referring MD:  Date of Birth: 24-May-1966 Gender: Male Account #: [id number] Procedure:                Colonoscopy Indications:              Hematochezia Medicines:                Monitored Anesthesia Care Procedure:                Pre-Anesthesia Assessment:                           - Prior to the procedure, a History and Physical                            was performed, and patient medications and                            allergies were reviewed. The patient's tolerance of                            previous anesthesia was also reviewed. The risks                            and benefits of the procedure and the sedation                            options and risks were discussed with the patient.                            All questions were answered, and informed consent                            was obtained. Prior Anticoagulants: The patient has                            taken no anticoagulant or antiplatelet agents. ASA                            Grade Assessment: III - A patient with severe                            systemic disease. After reviewing the risks and                            benefits, the patient was deemed in satisfactory                            condition to undergo the procedure.                           After obtaining informed consent, the colonoscope  was passed under direct vision. Throughout the                            procedure, the patient's blood pressure, pulse, and                            oxygen saturations were monitored continuously. The                            CF [id number] #[id number] was introduced through the anus                            and advanced to the the cecum, identified by                            appendiceal orifice and ileocecal valve.  The                            colonoscopy was performed without difficulty. The                            patient tolerated the procedure well. The quality                            of the bowel preparation was good. The ileocecal                            valve, appendiceal orifice, and rectum were                            photographed. The bowel preparation used was 2 day                            Suprep/Miralax. Scope In: 3:26:24 PM Scope Out: 3:39:02 PM Scope Withdrawal Time: 0 hours 9 minutes 15 seconds  Total Procedure Duration: 0 hours 12 minutes 38 seconds  Findings:                 The perianal and digital rectal examinations were                            normal.                           Repeat examination of right colon under NBI                            performed.                           A few small-mouthed diverticula were found in the                            entire colon.  Internal hemorrhoids were found.                           The exam was otherwise without abnormality on                            direct and retroflexion views. Complications:            No immediate complications. Estimated Blood Loss:     Estimated blood loss: none. Impression:               - Diverticulosis in the entire examined colon.                           - Internal hemorrhoids.                           - The examination was otherwise normal on direct                            and retroflexion views.                           - No specimens collected. Recommendation:           - Patient has a contact number available for                            emergencies. The signs and symptoms of potential                            delayed complications were discussed with the                            patient. Return to normal activities tomorrow.                            Written discharge instructions were provided to the                             patient.                           - Resume previous diet.                           - Continue present medications.                           - Repeat colonoscopy in 10 years for screening                            purposes.                           - Return to my office at the next available  appointment if interested in hemorrhoidal banding.                           - Begin taking a daily fiber supplement                           - See the other procedure note for documentation of                            additional recommendations. Rohith Fauth L. Legrand, MD 08/26/2024 3:53:59 PM This report has been signed electronically.

## 2024-08-26 NOTE — Op Note (Signed)
 [facility] Patient Name: Kevin Pena Procedure Date: 08/26/2024 3:08 PM MRN: [id number] Endoscopist: Victory L. Legrand , MD, [id number] Age: 58 Referring MD:  Date of Birth: 1966-08-16 Gender: Male Account #: [id number] Procedure:                Upper GI endoscopy Indications:              Epigastric abdominal pain, Abdominal bloating Medicines:                Monitored Anesthesia Care Procedure:                Pre-Anesthesia Assessment:                           - Prior to the procedure, a History and Physical                            was performed, and patient medications and                            allergies were reviewed. The patient's tolerance of                            previous anesthesia was also reviewed. The risks                            and benefits of the procedure and the sedation                            options and risks were discussed with the patient.                            All questions were answered, and informed consent                            was obtained. Prior Anticoagulants: The patient has                            taken no anticoagulant or antiplatelet agents. ASA                            Grade Assessment: III - A patient with severe                            systemic disease. After reviewing the risks and                            benefits, the patient was deemed in satisfactory                            condition to undergo the procedure.                           After obtaining informed consent, the endoscope was  passed under direct vision. Throughout the                            procedure, the patient's blood pressure, pulse, and                            oxygen saturations were monitored continuously. The                            GIF [id number] #[id number] was introduced through the                            mouth, and advanced to the second part of duodenum.                             The upper GI endoscopy was accomplished without                            difficulty. The patient tolerated the procedure                            well. Scope In: 3:41:48 PM Scope Out: 3:49:37 PM Total Procedure Duration: 0 hours 7 minutes 48 seconds  Findings:                 A 2 cm hiatal hernia was present.                           The exam of the esophagus was otherwise normal.                           Patchy mildly erythematous mucosa was found in the                            gastric body. Several biopsies were obtained in the                            gastric body and in the gastric antrum with cold                            forceps for histology. (r/o H. pylori)                           The exam of the stomach was otherwise normal,                            including on retroflexion. Distended well with                            insufflation                           Normal mucosa was found in the entire duodenum.  Biopsies for histology were taken with a cold                            forceps for evaluation of celiac disease. Complications:            No immediate complications. Estimated Blood Loss:     Estimated blood loss was minimal. Impression:               - 2 cm hiatal hernia.                           - Erythematous mucosa in the gastric body.                           - Normal mucosa was found in the entire examined                            duodenum. Biopsied.                           - Several biopsies were obtained in the gastric                            body and in the gastric antrum. Recommendation:           - Patient has a contact number available for                            emergencies. The signs and symptoms of potential                            delayed complications were discussed with the                            patient. Return to normal activities tomorrow.                            Written discharge  instructions were provided to the                            patient.                           - Resume previous diet.                           - Continue present medications.                           - Await pathology results.                           - See the other procedure note for documentation of                            additional recommendations. Bleu Minerd L. Legrand, MD 08/26/2024 3:57:42 PM This report has been signed  electronically.

## 2024-08-26 NOTE — Patient Instructions (Signed)
 YOU HAD AN ENDOSCOPIC PROCEDURE TODAY AT THE [facility]:   Refer to the procedure report that was given to you for any specific questions about what was found during the examination.  If the procedure report does not answer your questions, please call your gastroenterologist to clarify.  If you requested that your care partner not be given the details of your procedure findings, then the procedure report has been included in a sealed envelope for you to review at your convenience later.  YOU SHOULD EXPECT: Some feelings of bloating in the abdomen. Passage of more gas than usual.  Walking can help get rid of the air that was put into your GI tract during the procedure and reduce the bloating. If you had a lower endoscopy (such as a colonoscopy or flexible sigmoidoscopy) you may notice spotting of blood in your stool or on the toilet paper. If you underwent a bowel prep for your procedure, you may not have a normal bowel movement for a few days.  Please Note:  You might notice some irritation and congestion in your nose or some drainage.  This is from the oxygen used during your procedure.  There is no need for concern and it should clear up in a day or so.  SYMPTOMS TO REPORT IMMEDIATELY:  Following lower endoscopy (colonoscopy or flexible sigmoidoscopy):  Excessive amounts of blood in the stool  Significant tenderness or worsening of abdominal pains  Swelling of the abdomen that is new, acute  Fever of 100F or higher  Following upper endoscopy (EGD)  Vomiting of blood or coffee ground material  New chest pain or pain under the shoulder blades  Painful or persistently difficult swallowing  New shortness of breath  Fever of 100F or higher  Black, tarry-looking stools  Resume previous diet Continue present medications Repeat colonoscopy in 10 years for screening purposes Begin taking a daily fiber supplement  Handouts on hemorrhoids and diverticulosis given  For urgent or  emergent issues, a gastroenterologist can be reached at any hour by calling [phone]. Do not use MyChart messaging for urgent concerns.    DIET:  We do recommend a small meal at first, but then you may proceed to your regular diet.  Drink plenty of fluids but you should avoid alcoholic beverages for 24 hours.  ACTIVITY:  You should plan to take it easy for the rest of today and you should NOT DRIVE or use heavy machinery until tomorrow (because of the sedation medicines used during the test).    FOLLOW UP: Our staff will call the number listed on your records the next business day following your procedure.  We will call around 7:15- 8:00 am to check on you and address any questions or concerns that you may have regarding the information given to you following your procedure. If we do not reach you, we will leave a message.     If any biopsies were taken you will be contacted by phone or by letter within the next 1-3 weeks.  Please call us  at [phone] if you have not heard about the biopsies in 3 weeks.    SIGNATURES/CONFIDENTIALITY: You and/or your care partner have signed paperwork which will be entered into your electronic medical record.  These signatures attest to the fact that that the information above on your After Visit Summary has been reviewed and is understood.  Full responsibility of the confidentiality of this discharge information lies with you and/or your care-partner.

## 2024-08-26 NOTE — Progress Notes (Signed)
 No significant changes to clinical history since GI office visit on 08/17/24.  The patient is appropriate for an endoscopic procedure in the ambulatory setting.  - Victory Brand, MD

## 2024-08-29 ENCOUNTER — Telehealth: Payer: Self-pay | Admitting: Lactation Services

## 2024-08-29 NOTE — Telephone Encounter (Signed)
  Follow up Call-     08/26/2024    2:14 PM  Call back number  Post procedure Call Back phone  # [phone]  Permission to leave phone message Yes     Patient questions:  Do you have a fever, pain , or abdominal swelling? No. Pain Score  0 *  Have you tolerated food without any problems? Yes.    Have you been able to return to your normal activities? Yes.    Do you have any questions about your discharge instructions: Diet   No. Medications  No. Follow up visit  No.  Do you have questions or concerns about your Care? No.  Actions: * If pain score is 4 or above: No action needed, pain <4.

## 2024-08-31 ENCOUNTER — Ambulatory Visit: Payer: Self-pay | Admitting: Gastroenterology

## 2024-08-31 LAB — SURGICAL PATHOLOGY

## 2024-09-23 ENCOUNTER — Other Ambulatory Visit: Payer: Self-pay | Admitting: Family Medicine

## 2024-09-23 DIAGNOSIS — Z79891 Long term (current) use of opiate analgesic: Secondary | ICD-10-CM

## 2024-09-23 NOTE — Telephone Encounter (Signed)
 Copied from CRM [id number]. Topic: Clinical - Medication Refill >> Sep 23, 2024 10:13 AM Winona R wrote: Medication: Oxycodone  HCl 20 MG TABS   Has the patient contacted their pharmacy? Yes (Agent: If no, request that the patient contact the pharmacy for the refill. If patient does not wish to contact the pharmacy document the reason why and proceed with request.) (Agent: If yes, when and what did the pharmacy advise?)  This is the patient's preferred pharmacy:  [id number] - [place], [place] - [address] AT [facility] [address] [place] [address] Phone: [phone] Fax: [phone]  Is this the correct pharmacy for this prescription? Yes If no, delete pharmacy and type the correct one.   Has the prescription been filled recently? Yes  Is the patient out of the medication? Yes  Has the patient been seen for an appointment in the last year OR does the patient have an upcoming appointment? Yes  Can we respond through MyChart? Yes  Agent: Please be advised that Rx refills may take up to 3 business days. We ask that you follow-up with your pharmacy.

## 2024-09-24 MED ORDER — OXYCODONE HCL 20 MG PO TABS: 20.0000 mg | ORAL_TABLET | Freq: Four times a day (QID) | ORAL | 0 refills | Status: DC | PRN

## 2024-10-04 ENCOUNTER — Encounter: Payer: Self-pay | Admitting: Family Medicine

## 2024-10-04 ENCOUNTER — Ambulatory Visit: Admitting: Family Medicine

## 2024-10-04 VITALS — BP 116/78 | HR 84 | Temp 98.0°F | Ht 70.0 in | Wt 215.0 lb

## 2024-10-04 DIAGNOSIS — K2100 Gastro-esophageal reflux disease with esophagitis, without bleeding: Secondary | ICD-10-CM

## 2024-10-04 DIAGNOSIS — Z23 Encounter for immunization: Secondary | ICD-10-CM

## 2024-10-04 DIAGNOSIS — E66811 Obesity, class 1: Secondary | ICD-10-CM

## 2024-10-04 DIAGNOSIS — R600 Localized edema: Secondary | ICD-10-CM

## 2024-10-04 DIAGNOSIS — G90521 Complex regional pain syndrome I of right lower limb: Secondary | ICD-10-CM

## 2024-10-04 DIAGNOSIS — E782 Mixed hyperlipidemia: Secondary | ICD-10-CM

## 2024-10-04 DIAGNOSIS — B20 Human immunodeficiency virus [HIV] disease: Secondary | ICD-10-CM

## 2024-10-04 DIAGNOSIS — F1120 Opioid dependence, uncomplicated: Secondary | ICD-10-CM

## 2024-10-04 DIAGNOSIS — E6609 Other obesity due to excess calories: Secondary | ICD-10-CM

## 2024-10-04 DIAGNOSIS — Z6830 Body mass index (BMI) 30.0-30.9, adult: Secondary | ICD-10-CM

## 2024-10-04 DIAGNOSIS — K921 Melena: Secondary | ICD-10-CM

## 2024-10-04 LAB — POCT LIPID PANEL
HDL: 71
LDL: 116
Non-HDL: 133
TC: 204
TRG: 84

## 2024-10-04 MED ORDER — PHENTERMINE HCL 37.5 MG PO CAPS: 37.5000 mg | ORAL_CAPSULE | ORAL | 0 refills | Status: DC

## 2024-10-04 MED ORDER — ATORVASTATIN CALCIUM 40 MG PO TABS: 40.0000 mg | ORAL_TABLET | Freq: Every day | ORAL | 0 refills | Status: DC

## 2024-10-04 NOTE — Progress Notes (Signed)
 Subjective:  Patient ID: Kevin Pena, male    DOB: 05-22-1966  Age: 58 y.o. MRN: [id number]  Chief Complaint  Patient presents with   Medical Management of Chronic Issues    HPI: Discussed the use of AI scribe software for clinical note transcription with the patient, who gave verbal consent to proceed.  History of Present Illness Kevin Pena is a 58 year old male who presents with ongoing rectal bleeding.  Rectal bleeding and gastrointestinal symptoms - Persistent daily rectal bleeding. - Recent endoscopic evaluation revealed no significant findings; biopsies were normal. - Lightheadedness present, without severe dizziness or syncope. - Symptom management with probiotics and fiber supplements. - On Linzess  for constipation and Nexium 40 mg twice daily. - No bladder issues.  Metabolic and cardiovascular health - On atorvastatin 20 mg daily for hyperlipidemia. - Recent lipid panel: total cholesterol 204 mg/dL, HDL 71 mg/dL, triglycerides 84 mg/dL, LDL 883 mg/dL. - No diagnosis of diabetes; previous A1c 5.2%. - No history of heart problems; recent cardiac evaluations normal. - Blood pressure readings have been normal.  Neuropsychiatric symptoms - History of anxiety and depression, previously treated with citalopram. - Discontinued citalopram about one week after a hospital stay due to fatigue. - No current depressive symptoms.  Appetite and hydration - Frequent hunger, feeling starved shortly after eating. - Diet consists of chicken, fish, and salmon; hunger unrelated to food type, more related to frequency of eating. - Walks 5-6 miles daily. - Drinks a lot of water , with decreased intake in winter.  Musculoskeletal symptoms - Headaches located at the occipital region, associated with back pain. - Physically demanding lifestyle, works on a farm and walks extensively. - Prolonged sitting or lying down causes knee and hip pain, alleviated by  activity. - History of leg swelling, persistent but not severe.  Respiratory and nasal symptoms - Uses Flonase  nasal spray and albuterol  inhaler. - No chest pain or breathing problems. - Denies earaches, sore throat, or nasal congestion.  Visual disturbances and balance - Episodes of blurry vision and sensation of impending fall. - Attributes symptoms to environmental exposure ('chicken dust'), but etiology unclear.  Other symptoms - Occasional night sweats, attributed to bedding.  HIV Management per specialist.  Continue odefsey.        10/04/2024   10:33 AM 06/27/2024    9:11 AM 05/05/2024    1:33 PM 03/23/2024   10:08 AM 09/07/2023    1:32 PM  Depression screen PHQ 2/9  Decreased Interest 0 0 1 1 0  Down, Depressed, Hopeless 0 0 1 1 0  PHQ - 2 Score 0 0 2 2 0  Altered sleeping 0 0 1 1 2   Tired, decreased energy 3 2 2 2 1   Change in appetite 2 2 2 2 1   Feeling bad or failure about yourself  0 0 1 1 0  Trouble concentrating 0 0 1 1 0  Moving slowly or fidgety/restless 0 0 0 0 0  Suicidal thoughts 0 0 0 0 0  PHQ-9 Score 5 4  9  9  4    Difficult doing work/chores Not difficult at all Somewhat difficult Not difficult at all Somewhat difficult Somewhat difficult     Data saved with a previous flowsheet row definition        06/27/2024    9:10 AM  Fall Risk   Falls in the past year? 1  Number falls in past yr: 0  Injury with Fall? 0  Risk for fall due to :  No Fall Risks  Follow up Falls evaluation completed    Patient Care Team: [name], MD as PCP - General (Internal Medicine) Makhari Dovidio, Arley HERO, MD as Referring Physician (Infectious Diseases)   Review of Systems  All other systems reviewed and are negative.   Current Outpatient Medications on File Prior to Visit  Medication Sig Dispense Refill   albuterol  (VENTOLIN  HFA) 108 (90 Base) MCG/ACT inhaler TAKE 2 PUFFS BY MOUTH EVERY 6 HOURS AS NEEDED FOR WHEEZE OR SHORTNESS OF BREATH 17 each 3   esomeprazole (NEXIUM)  40 MG capsule TAKE 1 CAPSULE BY MOUTH TWICE A DAY 180 capsule 1   fluticasone  (FLONASE ) 50 MCG/ACT nasal spray SPRAY 1 SPRAY INTO EACH NOSTRIL EVERY DAY AS NEEDED FOR ALLERGIES 48 mL 2   linaclotide  (LINZESS ) 72 MCG capsule TAKE 1 CAPSULE BY MOUTH EVERY DAY BEFORE BREAKFAST 90 capsule 1   ODEFSEY 200-25-25 MG TABS tablet Take 1 tablet by mouth every morning.     Oxycodone  HCl 20 MG TABS Take 1 tablet (20 mg total) by mouth 4 (four) times daily as needed (severe pain). 120 tablet 0   promethazine  (PHENERGAN ) 25 MG tablet TAKE 1 TABLET BY MOUTH EVERY 8 HOURS AS NEEDED FOR NAUSEA OR VOMITING. 30 tablet 1   valACYclovir (VALTREX) 500 MG tablet Take 1 tablet by mouth daily as needed (outbreaks).      No current facility-administered medications on file prior to visit.   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   Fecal urgency 09/15/2014   GERD (gastroesophageal reflux disease)    HIV (human immunodeficiency virus infection) (HCC)    Hyperlipidemia    Hypertension 03/29/2014   Internal hemorrhoids    Left hip pain 05/18/2022   Long-term current use of opiate analgesic 03/14/2020   Lumbar pain 05/18/2022   Mixed hyperlipidemia 03/29/2014   Neuropathy 06/18/2017   Opioid use 09/14/2022   Plantar fasciitis of right foot 01/04/2016   Postoperative examination 03/10/2016   Preoperative clearance 12/24/2022   Presence of tooth-root and mandibular implants 05/29/2020   Rash left leg-- using topical cream    Spondylosis with myelopathy, 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   Thrombosed external hemorrhoid 01/28/2016   Tobacco abuse 11/06/2016   Toenail deformity 05/31/2023   Past Surgical History:  Procedure Laterality Date   APPENDECTOMY  1981   CHOLECYSTECTOMY  2006   dental implants  11/2020   FOOT SURGERY     HAMMER TOE SURGERY  OCT 2010   INTERSTIM IMPLANT PLACEMENT  06/17/2012   Procedure: RENNA IMPLANT FIRST STAGE;  Surgeon: Glendia DELENA Elizabeth, MD;  Location: [facility];  Service: Urology;  Laterality: N/A;  RAD TECH OK PER VICKIE AT MAIN OR    INTERSTIM IMPLANT PLACEMENT  06/17/2012   Procedure: INTERSTIM IMPLANT SECOND STAGE;  Surgeon: Glendia DELENA Elizabeth, MD;  Location: [facility];  Service: Urology;  Laterality: N/A;   MOUTH SURGERY  02/16/2020   REPAIR RECURRENT RIGHT INGUINAL HERNIA  2000   RIGHT INGUINAL HERNIA REPAIR  1997   UMBILICAL HERNIA REPAIR  2010    Family History  Problem Relation Age of Onset   Other Mother        colon resection, tumor near liver   Diabetes Mother    Hyperlipidemia Mother    Hypertension Mother    Varicose Veins Mother    Heart Problems Father    Diabetes Father    Heart disease Father    Hyperlipidemia Father    Hypertension Father    Pancreatic cancer Neg Hx    Rectal cancer Neg Hx    Stomach cancer Neg Hx    Colon cancer Neg Hx    Esophageal cancer Neg Hx    Social History   Socioeconomic History   Marital status: Significant Other    Spouse name: Dempsey Code   Number of children: 1   Years of education: College   Highest education level: Not on file  Occupational History    Employer: OTHER    Comment: disability  Tobacco Use   Smoking status: Former    Current packs/day: 0.00    Average packs/day: 1 pack/day for 30.0 years (30.0 ttl pk-yrs)    Types: Cigarettes    Start date: 06/11/1980    Quit date: 06/11/2010    Years since quitting: 14.3   Smokeless tobacco: Never  Vaping Use   Vaping  status: Former  Substance and Sexual Activity   Alcohol  use: Yes    Alcohol /week: 2.0 standard drinks of alcohol     Types: 2 Cans of beer per week    Comment: rarely   Drug use: No   Sexual activity: Not Currently    Partners: Male  Other Topics Concern   Not on file  Social History Narrative   Patient lives at home with partner.   Caffeine Use: 32oz daily coffee/tea      Disability.    Social Drivers of Corporate Investment Banker Strain: Low Risk  (05/05/2024)   Overall Financial Resource Strain (CARDIA)    Difficulty of Paying Living Expenses: Not hard at all  Food Insecurity: No Food Insecurity (05/23/2024)   Received from [facility]   Hunger Vital Sign    Within the past 12 months, you worried that your food would run out before you got the money to buy more.: Never true    Within the past 12 months, the food you bought just didn't last and you didn't have money to get more.: Never true  Transportation Needs: No Transportation Needs (05/05/2024)   PRAPARE - Administrator, Civil Service (Medical): No    Lack of Transportation (Non-Medical): No  Physical Activity: Sufficiently Active (05/05/2024)   Exercise Vital Sign    Days of Exercise per Week: 5 days    Minutes of Exercise per Session: 30 min  Recent Concern: Physical Activity - Insufficiently Active (03/23/2024)   Exercise Vital Sign    Days of Exercise per Week: 3 days    Minutes of Exercise per Session: 30 min  Stress: No Stress Concern Present (05/05/2024)   [employer] of Occupational Health - Occupational Stress Questionnaire    Feeling of Stress: Not at all  Social Connections: Moderately Isolated (05/05/2024)   Social Connection and Isolation Panel    Frequency of Communication with Friends and Family: Twice a week    Frequency of Social Gatherings with Friends and Family: Twice a week    Attends Religious Services: Never    Active Member of  Clubs or Organizations: No     Attends Banker Meetings: Never    Marital Status: Living with partner    Objective:  BP 116/78   Pulse 84   Temp 98 F (36.7 C)   Ht 5' 10 (1.778 m)   Wt 215 lb (97.5 kg)   SpO2 97%   BMI 30.85 kg/m      10/04/2024   10:32 AM 08/26/2024    4:13 PM 08/26/2024    4:03 PM  BP/Weight  Systolic BP 116 103 120  Diastolic BP 78 57 87  Wt. (Lbs) 215    BMI 30.85 kg/m2      Physical Exam Vitals reviewed.  Constitutional:      Appearance: Normal appearance.  Neck:     Vascular: No carotid bruit.  Cardiovascular:     Rate and Rhythm: Normal rate and regular rhythm.     Heart sounds: Normal heart sounds.  Pulmonary:     Effort: Pulmonary effort is normal.     Breath sounds: Normal breath sounds. No wheezing, rhonchi or rales.  Abdominal:     General: Bowel sounds are normal.     Palpations: Abdomen is soft.     Tenderness: There is no abdominal tenderness.  Musculoskeletal:        General: Normal range of motion.  Neurological:     Mental Status: He is alert.  Psychiatric:        Mood and Affect: Mood normal.        Behavior: Behavior normal.         Lab Results  Component Value Date   WBC 9.0 10/04/2024   HGB 14.0 10/04/2024   HCT 42.1 10/04/2024   PLT 203 10/04/2024   GLUCOSE 94 10/04/2024   CHOL 157 03/23/2024   TRIG 86 03/23/2024   HDL 33 (L) 03/23/2024   LDLCALC 108 (H) 03/23/2024   ALT 19 10/04/2024   AST 25 10/04/2024   NA 136 10/04/2024   K 4.1 10/04/2024   CL 103 10/04/2024   CREATININE 1.31 (H) 10/04/2024   BUN 18 10/04/2024   CO2 21 10/04/2024   HGBA1C 5.2 03/28/2024    Results for orders placed or performed in visit on 10/04/24  POCT Lipid Panel   Collection Time: 10/04/24 10:48 AM  Result Value Ref Range   TC 204    HDL 71    TRG 84    LDL 116    Non-HDL 133    TC/HDL    CBC with Differential/Platelet   Collection Time: 10/04/24 11:40 AM  Result Value Ref Range   WBC 9.0 3.4 - 10.8 x10E3/uL   RBC 4.39 4.14 -  5.80 x10E6/uL   Hemoglobin 14.0 13.0 - 17.7 g/dL   Hematocrit 57.8 62.4 - 51.0 %   MCV 96 79 - 97 fL   MCH 31.9 26.6 - 33.0 pg   MCHC 33.3 31.5 - 35.7 g/dL   RDW 87.3 88.3 - 84.5 %   Platelets 203 150 - 450 x10E3/uL   Neutrophils 67 Not Estab. %   Lymphs 22 Not Estab. %   Monocytes 9 Not Estab. %   Eos 1 Not Estab. %   Basos 1 Not Estab. %   Neutrophils Absolute 6.0 1.4 - 7.0 x10E3/uL   Lymphocytes Absolute 2.0 0.7 - 3.1 x10E3/uL   Monocytes Absolute 0.8 0.1 - 0.9 x10E3/uL   EOS (ABSOLUTE) 0.1 0.0 - 0.4 x10E3/uL   Basophils Absolute 0.1 0.0 - 0.2 x10E3/uL  Immature Granulocytes 0 Not Estab. %   Immature Grans (Abs) 0.0 0.0 - 0.1 x10E3/uL  Comprehensive metabolic panel with GFR   Collection Time: 10/04/24 11:40 AM  Result Value Ref Range   Glucose 94 70 - 99 mg/dL   BUN 18 8 - 27 mg/dL   Creatinine, Ser 8.68 (H) 0.76 - 1.27 mg/dL   eGFR 62 >40 fO/fpw/8.26   BUN/Creatinine Ratio 14 10 - 24   Sodium 136 134 - 144 mmol/L   Potassium 4.1 3.5 - 5.2 mmol/L   Chloride 103 96 - 106 mmol/L   CO2 21 20 - 29 mmol/L   Calcium 8.6 8.6 - 10.2 mg/dL   Total Protein 6.4 6.0 - 8.5 g/dL   Albumin 4.2 3.8 - 4.9 g/dL   Globulin, Total 2.2 1.5 - 4.5 g/dL   Bilirubin Total 0.4 0.0 - 1.2 mg/dL   Alkaline Phosphatase 87 47 - 123 IU/L   AST 25 0 - 40 IU/L   ALT 19 0 - 44 IU/L  .  Assessment & Plan:   Assessment & Plan Mixed hyperlipidemia LDL cholesterol elevated at 116 mg/dL. Total cholesterol 204 mg/dL, HDL 71 mg/dL, triglycerides 84 mg/dL. On atorvastatin 20 mg daily. - Increase atorvastatin to 40 mg daily. Orders:   POCT Lipid Panel   atorvastatin (LIPITOR) 40 MG tablet; Take 1 tablet (40 mg total) by mouth daily.  Encounter for immunization  Orders:   Pneumococcal conjugate vaccine 20-valent  Hematochezia Check for anemia.  Management per specialist.  Agree with plan for endoscopy.  Orders:   CBC with Differential/Platelet  Complex regional pain syndrome type 1 of right  lower extremity Chronic pain in right lower limb likely due to complex regional pain syndrome. Exacerbated by prolonged sitting or lying down. Continue current medication.    Gastroesophageal reflux disease with esophagitis without hemorrhage The current medical regimen is effective;  continue present plan and medications. Continue nexium 40 mg twice daily.      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  Orders:   Comprehensive metabolic panel with GFR  Class 1 obesity due to excess calories with serious comorbidity and body mass index (BMI) of 30.0 to 30.9 in adult Recommend continue to work on eating healthy diet and exercise. Prescription for phentermine sent.  Orders:   phentermine 37.5 MG capsule; Take 1 capsule (37.5 mg total) by mouth every morning.  Human immunodeficiency virus (HIV) disease (HCC) The current medical regimen is effective;  continue present plan and medications. Management per specialist.        Body mass index is 30.85 kg/m.    Meds ordered this encounter  Medications   atorvastatin (LIPITOR) 40 MG tablet    Sig: Take 1 tablet (40 mg total) by mouth daily.    Dispense:  90 tablet    Refill:  0   phentermine 37.5 MG capsule    Sig: Take 1 capsule (37.5 mg total) by mouth every morning.    Dispense:  30 capsule    Refill:  0    Orders Placed This Encounter  Procedures   Pneumococcal conjugate vaccine 20-valent   CBC with Differential/Platelet   Comprehensive metabolic panel with GFR   POCT Lipid Panel       Follow-up: Return in about 4 weeks (around 11/01/2024) for Dina or Dr. Sirivol for wt management. 3 months follow up with me. .  An After Visit Summary was printed and given to the patient.  Abigail Free, MD [facility] [phone]

## 2024-10-04 NOTE — Assessment & Plan Note (Addendum)
 LDL cholesterol elevated at 116 mg/dL. Total cholesterol 204 mg/dL, HDL 71 mg/dL, triglycerides 84 mg/dL. On atorvastatin 20 mg daily. - Increase atorvastatin to 40 mg daily. Orders:   POCT Lipid Panel   atorvastatin (LIPITOR) 40 MG tablet; Take 1 tablet (40 mg total) by mouth daily.

## 2024-10-05 ENCOUNTER — Ambulatory Visit: Payer: Self-pay | Admitting: Family Medicine

## 2024-10-05 LAB — COMPREHENSIVE METABOLIC PANEL WITH GFR
ALT: 19 IU/L (ref 0–44)
AST: 25 IU/L (ref 0–40)
Albumin: 4.2 g/dL (ref 3.8–4.9)
Alkaline Phosphatase: 87 IU/L (ref 47–123)
BUN/Creatinine Ratio: 14 (ref 10–24)
BUN: 18 mg/dL (ref 8–27)
Bilirubin Total: 0.4 mg/dL (ref 0.0–1.2)
CO2: 21 mmol/L (ref 20–29)
Calcium: 8.6 mg/dL (ref 8.6–10.2)
Chloride: 103 mmol/L (ref 96–106)
Creatinine, Ser: 1.31 mg/dL — ABNORMAL HIGH (ref 0.76–1.27)
Globulin, Total: 2.2 g/dL (ref 1.5–4.5)
Glucose: 94 mg/dL (ref 70–99)
Potassium: 4.1 mmol/L (ref 3.5–5.2)
Sodium: 136 mmol/L (ref 134–144)
Total Protein: 6.4 g/dL (ref 6.0–8.5)
eGFR: 62 mL/min/1.73 (ref >59)

## 2024-10-05 LAB — CBC WITH DIFFERENTIAL/PLATELET
Basophils Absolute: 0.1 x10E3/uL (ref 0.0–0.2)
Basos: 1 %
EOS (ABSOLUTE): 0.1 x10E3/uL (ref 0.0–0.4)
Eos: 1 %
Hematocrit: 42.1 % (ref 37.5–51.0)
Hemoglobin: 14 g/dL (ref 13.0–17.7)
Immature Grans (Abs): 0 x10E3/uL (ref 0.0–0.1)
Immature Granulocytes: 0 %
Lymphocytes Absolute: 2 x10E3/uL (ref 0.7–3.1)
Lymphs: 22 %
MCH: 31.9 pg (ref 26.6–33.0)
MCHC: 33.3 g/dL (ref 31.5–35.7)
MCV: 96 fL (ref 79–97)
Monocytes Absolute: 0.8 x10E3/uL (ref 0.1–0.9)
Monocytes: 9 %
Neutrophils Absolute: 6 x10E3/uL (ref 1.4–7.0)
Neutrophils: 67 %
Platelets: 203 x10E3/uL (ref 150–450)
RBC: 4.39 x10E6/uL (ref 4.14–5.80)
RDW: 12.6 % (ref 11.6–15.4)
WBC: 9 x10E3/uL (ref 3.4–10.8)

## 2024-10-08 DIAGNOSIS — R600 Localized edema: Secondary | ICD-10-CM | POA: Insufficient documentation

## 2024-10-08 DIAGNOSIS — K921 Melena: Secondary | ICD-10-CM | POA: Insufficient documentation

## 2024-10-08 NOTE — Assessment & Plan Note (Signed)
 Recommend continue to work on eating healthy diet and exercise. Prescription for phentermine sent.  Orders:   phentermine 37.5 MG capsule; Take 1 capsule (37.5 mg total) by mouth every morning.

## 2024-10-08 NOTE — Assessment & Plan Note (Signed)
Due to RPS

## 2024-10-08 NOTE — Assessment & Plan Note (Signed)
 Chronic pain in right lower limb likely due to complex regional pain syndrome. Exacerbated by prolonged sitting or lying down. Continue current medication.

## 2024-10-08 NOTE — Assessment & Plan Note (Signed)
 Check for anemia.  Management per specialist.  Agree with plan for endoscopy.  Orders:   CBC with Differential/Platelet

## 2024-10-08 NOTE — Assessment & Plan Note (Signed)
 Recommend elevation.  Orders:   Comprehensive metabolic panel with GFR

## 2024-10-08 NOTE — Assessment & Plan Note (Signed)
The current medical regimen is effective;  continue present plan and medications. Continue nexium 40 mg twice daily.  

## 2024-10-17 ENCOUNTER — Encounter: Payer: Self-pay | Admitting: Gastroenterology

## 2024-10-17 ENCOUNTER — Ambulatory Visit: Admitting: Gastroenterology

## 2024-10-17 VITALS — BP 118/82 | HR 69 | Ht 66.0 in | Wt 204.4 lb

## 2024-10-17 DIAGNOSIS — K648 Other hemorrhoids: Secondary | ICD-10-CM | POA: Diagnosis not present

## 2024-10-17 DIAGNOSIS — K5909 Other constipation: Secondary | ICD-10-CM

## 2024-10-17 NOTE — Progress Notes (Signed)
 [facility] Progress Note  Chief Complaint: Chronic constipation and hemorrhoidal bleeding  Summary of GI history:  Office note with APP 08/13/2024.  Subsequent procedures 08/26/2024  Subjective  HPI:  Kevin Pena is glad to report that his constipation is improved on daily fiber supplement and probiotic.  He has a BM every day with less need for straining he is not sitting on the toilet as long.  He has also then had a decrease in the frequency and severity of hemorrhoidal bleeding.  He had 1 episode a week ago and none since.  He has been using a hemorrhoidal cream, and his PCP suggested that we might discuss trying another with a has hydrocortisone  in it.  He does not experience perianal burning or itching  ROS: Cardiovascular:  no chest pain Respiratory: no dyspnea  The patient's Past Medical, Family and Social History were reviewed and are on file in the EMR. 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   Fecal urgency 09/15/2014   GERD (gastroesophageal reflux disease)    HIV (human immunodeficiency virus infection) (HCC)    Hyperlipidemia    Hypertension 03/29/2014   Internal hemorrhoids    Left hip pain 05/18/2022   Long-term current use of opiate analgesic 03/14/2020   Lumbar pain 05/18/2022   Mixed hyperlipidemia 03/29/2014   Neuropathy 06/18/2017   Opioid use 09/14/2022   Plantar fasciitis of right foot 01/04/2016   Postoperative examination 03/10/2016   Preoperative clearance  12/24/2022   Presence of tooth-root and mandibular implants 05/29/2020   Rash left leg-- using topical cream   Spondylosis with myelopathy, 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   Thrombosed external hemorrhoid 01/28/2016   Tobacco abuse 11/06/2016   Toenail deformity 05/31/2023    Past Surgical History:  Procedure Laterality Date   APPENDECTOMY  1981   CHOLECYSTECTOMY  2006   dental implants  11/2020   FOOT SURGERY     HAMMER TOE SURGERY  OCT 2010   INTERSTIM IMPLANT PLACEMENT  06/17/2012   Procedure: RENNA IMPLANT FIRST STAGE;  Surgeon: Glendia DELENA Elizabeth, MD;  Location: [facility];  Service: Urology;  Laterality: N/A;  RAD TECH OK PER VICKIE AT MAIN OR    INTERSTIM IMPLANT PLACEMENT  06/17/2012   Procedure: INTERSTIM IMPLANT SECOND STAGE;  Surgeon: Glendia DELENA Elizabeth, MD;  Location: [facility];  Service: Urology;  Laterality: N/A;   MOUTH SURGERY  02/16/2020   REPAIR RECURRENT RIGHT INGUINAL HERNIA  2000   RIGHT INGUINAL HERNIA REPAIR  1997   UMBILICAL HERNIA REPAIR  2010    Objective:  Med list reviewed  Current Outpatient Medications:    albuterol  (VENTOLIN  HFA) 108 (90 Base) MCG/ACT inhaler, TAKE 2 PUFFS BY MOUTH EVERY 6 HOURS AS NEEDED FOR WHEEZE OR SHORTNESS OF BREATH, Disp: 17 each, Rfl: 3   atorvastatin  (LIPITOR) 40 MG tablet, Take 1 tablet (40 mg total) by mouth daily., Disp: 90 tablet, Rfl: 0   esomeprazole (NEXIUM) 40 MG capsule, TAKE 1 CAPSULE BY MOUTH TWICE A  DAY, Disp: 180 capsule, Rfl: 1   fluticasone  (FLONASE ) 50 MCG/ACT nasal spray, SPRAY 1 SPRAY INTO EACH NOSTRIL EVERY DAY AS NEEDED FOR ALLERGIES, Disp: 48 mL, Rfl: 2   linaclotide  (LINZESS ) 72 MCG capsule, TAKE 1 CAPSULE BY MOUTH EVERY DAY BEFORE BREAKFAST, Disp: 90 capsule, Rfl: 1   ODEFSEY 200-25-25 MG TABS tablet, Take 1 tablet by mouth every morning., Disp: , Rfl:    Oxycodone  HCl 20 MG TABS, Take 1 tablet (20 mg total) by mouth 4 (four) times daily as needed (severe pain)., Disp: 120  tablet, Rfl: 0   phentermine  37.5 MG capsule, Take 1 capsule (37.5 mg total) by mouth every morning., Disp: 30 capsule, Rfl: 0   promethazine  (PHENERGAN ) 25 MG tablet, TAKE 1 TABLET BY MOUTH EVERY 8 HOURS AS NEEDED FOR NAUSEA OR VOMITING., Disp: 30 tablet, Rfl: 1   valACYclovir (VALTREX) 500 MG tablet, Take 1 tablet by mouth daily as needed (outbreaks). , Disp: , Rfl:    Vital signs in last 24 hrs: Vitals:   10/17/24 1504  BP: 118/82  Pulse: 69   Wt Readings from Last 3 Encounters:  10/17/24 204 lb 6 oz (92.7 kg)  10/04/24 215 lb (97.5 kg)  08/26/24 207 lb (93.9 kg)    Physical Exam  No additional exam.  Entire visit spent in review of symptoms, results and plan. Labs:  EGD and colonoscopy reports on file ___________________________________________ Radiologic studies:   ____________________________________________ Other:   _____________________________________________ Assessment & Plan  Assessment: Encounter Diagnoses  Name Primary?   Chronic constipation Yes   Bleeding internal hemorrhoids    Constipation improved lately with increased dietary fiber, and with that the bleeding has become less frequent.  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  Regarding topical therapy, I recommended OTC Preparation H suppository with just a phenylephrine and without hydrocortisone  as likely more effective therapy than creams and ointments.  I also gave him some samples of Calmol suppositories if he should develop hemorrhoidal burning and itching.  We will wait to hear from him if he decides to proceed with banding.  I described the procedure and how it would usually take 3 (and sometimes 4) sessions for hemorrhoidal eradication.  If he contacts us  requesting that our staff will get him in with me.   20 minutes were spent on this encounter  (including chart review, history/exam, counseling/coordination of care, and documentation) > 50% of that time was spent on counseling and coordination of care.   Kevin Pena

## 2024-10-24 ENCOUNTER — Telehealth: Payer: Self-pay | Admitting: Family Medicine

## 2024-10-24 DIAGNOSIS — Z79891 Long term (current) use of opiate analgesic: Secondary | ICD-10-CM

## 2024-10-24 NOTE — Telephone Encounter (Unsigned)
 Copied from CRM #[id number]. Topic: Clinical - Medication Refill >> Oct 24, 2024 10:25 AM Berwyn MATSU wrote: Medication: Oxycodone  HCl 20 MG TABS   Has the patient contacted their pharmacy? Yes (Agent: If no, request that the patient contact the pharmacy for the refill. If patient does not wish to contact the pharmacy document the reason why and proceed with request.) (Agent: If yes, when and what did the pharmacy advise?)  This is the patient's preferred pharmacy:  [id number] - [place], [place] - [address] AT [facility] [address] [place] [address] Phone: [phone] Fax: [phone]  Is this the correct pharmacy for this prescription? Yes If no, delete pharmacy and type the correct one.   Has the prescription been filled recently? Yes  Is the patient out of the medication? Yes  Has the patient been seen for an appointment in the last year OR does the patient have an upcoming appointment? Yes  Can we respond through MyChart? Yes  Agent: Please be advised that Rx refills may take up to 3 business days. We ask that you follow-up with your pharmacy.

## 2024-10-25 MED ORDER — OXYCODONE HCL 20 MG PO TABS: 20.0000 mg | ORAL_TABLET | Freq: Four times a day (QID) | ORAL | 0 refills | Status: DC | PRN

## 2024-11-02 ENCOUNTER — Encounter: Payer: Self-pay | Admitting: Family Medicine

## 2024-11-02 ENCOUNTER — Ambulatory Visit: Admitting: Family Medicine

## 2024-11-02 DIAGNOSIS — E66811 Obesity, class 1: Secondary | ICD-10-CM

## 2024-11-02 DIAGNOSIS — Z6830 Body mass index (BMI) 30.0-30.9, adult: Secondary | ICD-10-CM

## 2024-11-02 DIAGNOSIS — E6609 Other obesity due to excess calories: Secondary | ICD-10-CM

## 2024-11-02 MED ORDER — PHENTERMINE HCL 37.5 MG PO CAPS: 37.5000 mg | ORAL_CAPSULE | ORAL | 0 refills | Status: DC

## 2024-11-02 NOTE — Progress Notes (Unsigned)
 Subjective:  Patient ID: Kevin Pena, male    DOB: 22-Sep-1966  Age: 58 y.o. MRN: [id number]  Chief Complaint  Patient presents with   Medical Management of Chronic Issues    HPI: Discussed the use of AI scribe software for clinical note transcription with the patient, who gave verbal consent to proceed.         10/04/2024   10:33 AM 06/27/2024    9:11 AM 05/05/2024    1:33 PM 03/23/2024   10:08 AM 09/07/2023    1:32 PM  Depression screen PHQ 2/9  Decreased Interest 0 0 1 1 0  Down, Depressed, Hopeless 0 0 1 1 0  PHQ - 2 Score 0 0 2 2 0  Altered sleeping 0 0 1 1 2   Tired, decreased energy 3 2 2 2 1   Change in appetite 2 2 2 2 1   Feeling bad or failure about yourself  0 0 1 1 0  Trouble concentrating 0 0 1 1 0  Moving slowly or fidgety/restless 0 0 0 0 0  Suicidal thoughts 0 0 0 0 0  PHQ-9 Score 5 4  9  9  4    Difficult doing work/chores Not difficult at all Somewhat difficult Not difficult at all Somewhat difficult Somewhat difficult     Data saved with a previous flowsheet row definition        06/27/2024    9:10 AM  Fall Risk   Falls in the past year? 1  Number falls in past yr: 0  Injury with Fall? 0   Risk for fall due to : No Fall Risks  Follow up Falls evaluation completed     Data saved with a previous flowsheet row definition    Patient Care Team: [name], MD as PCP - General (Internal Medicine) Cox, Arley HERO, MD as Referring Physician (Infectious Diseases)   Review of Systems  Constitutional:  Negative for appetite change, fatigue and fever.  HENT:  Negative for congestion, ear pain, sinus pressure and sore throat.   Eyes: Negative.   Respiratory:  Negative for cough, chest tightness, shortness of breath and wheezing.   Cardiovascular:  Negative for chest pain and palpitations.  Gastrointestinal:  Negative for abdominal pain, constipation, diarrhea, nausea and vomiting.  Endocrine: Negative.   Genitourinary:  Negative for dysuria and  hematuria.  Musculoskeletal:  Negative for arthralgias, back pain, joint swelling and myalgias.  Skin:  Negative for rash.  Allergic/Immunologic: Negative.   Neurological:  Negative for dizziness, weakness, light-headedness and headaches.  Hematological: Negative.   Psychiatric/Behavioral:  Negative for dysphoric mood. The patient is not nervous/anxious.     Current Outpatient Medications on File Prior to Visit  Medication Sig Dispense Refill   albuterol  (VENTOLIN  HFA) 108 (90 Base) MCG/ACT inhaler TAKE 2 PUFFS BY MOUTH EVERY 6 HOURS AS NEEDED FOR WHEEZE OR SHORTNESS OF BREATH 17 each 3   atorvastatin  (LIPITOR) 40 MG tablet Take 1 tablet (40 mg total) by mouth daily. 90 tablet 0   esomeprazole (NEXIUM) 40 MG capsule TAKE 1 CAPSULE BY MOUTH TWICE A DAY 180 capsule 1   fluticasone  (FLONASE ) 50 MCG/ACT nasal spray SPRAY 1 SPRAY INTO EACH NOSTRIL EVERY DAY AS NEEDED FOR ALLERGIES 48 mL 2   linaclotide  (LINZESS ) 72 MCG capsule TAKE 1 CAPSULE BY MOUTH EVERY DAY BEFORE BREAKFAST 90 capsule 1   ODEFSEY 200-25-25 MG TABS tablet Take 1 tablet by mouth every morning.     Oxycodone  HCl 20 MG TABS Take 1  tablet (20 mg total) by mouth 4 (four) times daily as needed (severe pain). 120 tablet 0   phentermine  37.5 MG capsule Take 1 capsule (37.5 mg total) by mouth every morning. 30 capsule 0   promethazine  (PHENERGAN ) 25 MG tablet TAKE 1 TABLET BY MOUTH EVERY 8 HOURS AS NEEDED FOR NAUSEA OR VOMITING. 30 tablet 1   valACYclovir (VALTREX) 500 MG tablet Take 1 tablet by mouth daily as needed (outbreaks).      No current facility-administered medications on file prior to visit.   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   Fecal urgency 09/15/2014   GERD (gastroesophageal reflux disease)    HIV (human immunodeficiency virus infection) (HCC)    Hyperlipidemia    Hypertension 03/29/2014   Internal hemorrhoids    Left hip pain 05/18/2022   Long-term current use of opiate analgesic 03/14/2020   Lumbar pain 05/18/2022   Mixed hyperlipidemia 03/29/2014   Neuropathy 06/18/2017   Opioid use 09/14/2022   Plantar fasciitis of right foot 01/04/2016   Postoperative examination 03/10/2016   Preoperative clearance 12/24/2022   Presence of tooth-root and mandibular implants 05/29/2020   Rash left leg-- using topical cream   Spondylosis with myelopathy, 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   Thrombosed external hemorrhoid 01/28/2016   Tobacco abuse 11/06/2016   Toenail deformity 05/31/2023   Past Surgical History:  Procedure Laterality Date   APPENDECTOMY  1981   CHOLECYSTECTOMY  2006   dental implants  11/2020   FOOT SURGERY     HAMMER TOE SURGERY  OCT 2010   INTERSTIM IMPLANT PLACEMENT  06/17/2012   Procedure: RENNA IMPLANT FIRST STAGE;  Surgeon: Glendia DELENA Elizabeth, MD;  Location: [facility];  Service: Urology;  Laterality: N/A;  RAD TECH OK PER VICKIE AT MAIN OR    INTERSTIM IMPLANT PLACEMENT  06/17/2012   Procedure: INTERSTIM IMPLANT SECOND STAGE;  Surgeon: Glendia DELENA Elizabeth, MD;  Location: [facility];  Service: Urology;  Laterality: N/A;   MOUTH SURGERY  02/16/2020   REPAIR RECURRENT RIGHT INGUINAL HERNIA  2000   RIGHT INGUINAL HERNIA REPAIR  1997   UMBILICAL HERNIA REPAIR  2010    Family History  Problem Relation Age of Onset   Other Mother        colon resection, tumor near liver   Diabetes Mother    Hyperlipidemia Mother    Hypertension Mother    Varicose Veins Mother    Heart  Problems Father    Diabetes Father    Heart disease Father    Hyperlipidemia Father    Hypertension Father    Pancreatic cancer Neg Hx    Rectal cancer Neg Hx    Stomach cancer Neg Hx    Colon cancer Neg Hx    Esophageal cancer Neg Hx    Social History   Socioeconomic History   Marital status: Significant Other    Spouse name: Dempsey Code   Number of children: 1   Years of education: [employer] education  level: Not on file  Occupational History    Employer: OTHER    Comment: disability  Tobacco Use   Smoking status: Former    Current packs/day: 0.00    Average packs/day: 1 pack/day for 30.0 years (30.0 ttl pk-yrs)    Types: Cigarettes    Start date: 06/11/1980    Quit date: 06/11/2010    Years since quitting: 14.4   Smokeless tobacco: Never  Vaping Use   Vaping status: Former  Substance and Sexual Activity   Alcohol  use: Yes    Alcohol /week: 2.0 standard drinks of alcohol     Types: 2 Cans of beer per week    Comment: rarely   Drug use: No   Sexual activity: Not Currently    Partners: Male  Other Topics Concern   Not on file  Social History Narrative   Patient lives at home with partner.   Caffeine Use: 32oz daily coffee/tea      Disability.    Social Drivers of Corporate Investment Banker Strain: Low Risk  (05/05/2024)   Overall Financial Resource Strain (CARDIA)    Difficulty of Paying Living Expenses: Not hard at all  Food Insecurity: No Food Insecurity (05/23/2024)   Received from [facility]   Hunger Vital Sign    Within the past 12 months, you worried that your food would run out before you got the money to buy more.: Never true    Within the past 12 months, the food you bought just didn't last and you didn't have money to get more.: Never true  Transportation Needs: No Transportation Needs (05/05/2024)   PRAPARE - Administrator, Civil Service (Medical): No    Lack of Transportation (Non-Medical): No  Physical  Activity: Sufficiently Active (05/05/2024)   Exercise Vital Sign    Days of Exercise per Week: 5 days    Minutes of Exercise per Session: 30 min  Recent Concern: Physical Activity - Insufficiently Active (03/23/2024)   Exercise Vital Sign    Days of Exercise per Week: 3 days    Minutes of Exercise per Session: 30 min  Stress: No Stress Concern Present (05/05/2024)   [employer] of Occupational Health - Occupational Stress Questionnaire    Feeling of Stress: Not at all  Social Connections: Moderately Isolated (05/05/2024)   Social Connection and Isolation Panel    Frequency of Communication with Friends and Family: Twice a week    Frequency of Social Gatherings with Friends and Family: Twice a week    Attends Religious Services: Never    Diplomatic Services Operational Officer: No    Attends Engineer, Structural: Never    Marital Status: Living with partner    Objective:  BP 110/82   Pulse 76   Temp 97.8 F (36.6 C) (Temporal)   Resp 16   Ht 5' 6 (1.676 m)   Wt 202 lb 12.8 oz (92 kg)   SpO2 98%   BMI 32.73 kg/m      11/02/2024    1:39 PM 10/17/2024    3:04 PM 10/04/2024   10:32 AM  BP/Weight  Systolic BP 110 118 116  Diastolic BP 82 82 78  Wt. (Lbs) 202.8 204.38 215  BMI 32.73 kg/m2 32.99 kg/m2 30.85 kg/m2    Physical Exam  {Perform Simple Foot Exam  Perform Detailed exam:1} {Insert foot Exam (Optional):30965}   Lab Results  Component Value Date   WBC 9.0 10/04/2024   HGB 14.0 10/04/2024  HCT 42.1 10/04/2024   PLT 203 10/04/2024   GLUCOSE 94 10/04/2024   CHOL 157 03/23/2024   TRIG 86 03/23/2024   HDL 33 (L) 03/23/2024   LDLCALC 108 (H) 03/23/2024   ALT 19 10/04/2024   AST 25 10/04/2024   NA 136 10/04/2024   K 4.1 10/04/2024   CL 103 10/04/2024   CREATININE 1.31 (H) 10/04/2024   BUN 18 10/04/2024   CO2 21 10/04/2024   HGBA1C 5.2 03/28/2024    Results for orders placed or performed in visit on 10/04/24  POCT Lipid Panel   Collection  Time: 10/04/24 10:48 AM  Result Value Ref Range   TC 204    HDL 71    TRG 84    LDL 116    Non-HDL 133    TC/HDL    CBC with Differential/Platelet   Collection Time: 10/04/24 11:40 AM  Result Value Ref Range   WBC 9.0 3.4 - 10.8 x10E3/uL   RBC 4.39 4.14 - 5.80 x10E6/uL   Hemoglobin 14.0 13.0 - 17.7 g/dL   Hematocrit 57.8 62.4 - 51.0 %   MCV 96 79 - 97 fL   MCH 31.9 26.6 - 33.0 pg   MCHC 33.3 31.5 - 35.7 g/dL   RDW 87.3 88.3 - 84.5 %   Platelets 203 150 - 450 x10E3/uL   Neutrophils 67 Not Estab. %   Lymphs 22 Not Estab. %   Monocytes 9 Not Estab. %   Eos 1 Not Estab. %   Basos 1 Not Estab. %   Neutrophils Absolute 6.0 1.4 - 7.0 x10E3/uL   Lymphocytes Absolute 2.0 0.7 - 3.1 x10E3/uL   Monocytes Absolute 0.8 0.1 - 0.9 x10E3/uL   EOS (ABSOLUTE) 0.1 0.0 - 0.4 x10E3/uL   Basophils Absolute 0.1 0.0 - 0.2 x10E3/uL   Immature Granulocytes 0 Not Estab. %   Immature Grans (Abs) 0.0 0.0 - 0.1 x10E3/uL  Comprehensive metabolic panel with GFR   Collection Time: 10/04/24 11:40 AM  Result Value Ref Range   Glucose 94 70 - 99 mg/dL   BUN 18 8 - 27 mg/dL   Creatinine, Ser 8.68 (H) 0.76 - 1.27 mg/dL   eGFR 62 >40 fO/fpw/8.26   BUN/Creatinine Ratio 14 10 - 24   Sodium 136 134 - 144 mmol/L   Potassium 4.1 3.5 - 5.2 mmol/L   Chloride 103 96 - 106 mmol/L   CO2 21 20 - 29 mmol/L   Calcium  8.6 8.6 - 10.2 mg/dL   Total Protein 6.4 6.0 - 8.5 g/dL   Albumin 4.2 3.8 - 4.9 g/dL   Globulin, Total 2.2 1.5 - 4.5 g/dL   Bilirubin Total 0.4 0.0 - 1.2 mg/dL   Alkaline Phosphatase 87 47 - 123 IU/L   AST 25 0 - 40 IU/L   ALT 19 0 - 44 IU/L  .  Assessment & Plan:   Assessment & Plan    Body mass index is 32.73 kg/m.  Assessment and Plan      No orders of the defined types were placed in this encounter.   No orders of the defined types were placed in this encounter.      Follow-up: No follow-ups on file.  An After Visit Summary was printed and given to the patient.  Harrie CHRISTELLA Cedar, FNP [facility] [phone]

## 2024-11-04 NOTE — Assessment & Plan Note (Signed)
 Class 1 obesity Weight loss of 13 pounds. Blood pressure and blood sugar levels normalizing. Increased energy levels. Mild dry mouth and reduced coffee consumption noted. Fiber and probiotics beneficial. - Refilled phentermine  prescription.    Orders:   phentermine  37.5 MG capsule; Take 1 capsule (37.5 mg total) by mouth every morning.

## 2024-11-22 ENCOUNTER — Other Ambulatory Visit: Payer: Self-pay | Admitting: Family Medicine

## 2024-11-22 DIAGNOSIS — Z79891 Long term (current) use of opiate analgesic: Secondary | ICD-10-CM

## 2024-11-22 MED ORDER — OXYCODONE HCL 20 MG PO TABS: 20.0000 mg | ORAL_TABLET | Freq: Four times a day (QID) | ORAL | 0 refills | Status: DC | PRN

## 2024-11-22 NOTE — Telephone Encounter (Signed)
 Copied from CRM #[id number]. Topic: Clinical - Medication Refill >> Nov 22, 2024 10:38 AM Vanessa G wrote: Medication: Oxycodone  HCl 20 MG TABS  Has the patient contacted their pharmacy? Yes, referred to provider (Agent: If no, request that the patient contact the pharmacy for the refill. If patient does not wish to contact the pharmacy document the reason why and proceed with request.) (Agent: If yes, when and what did the pharmacy advise?)  This is the patient's preferred pharmacy:  [id number] [place], [place] - [address] AT [facility] [address] [place] [address] Phone: [phone] Fax: [phone]  Is this the correct pharmacy for this prescription? Yes If no, delete pharmacy and type the correct one.   Has the prescription been filled recently? No  Is the patient out of the medication? No  Has the patient been seen for an appointment in the last year OR does the patient have an upcoming appointment? Yes  Can we respond through MyChart? Yes  Agent: Please be advised that Rx refills may take up to 3 business days. We ask that you follow-up with your pharmacy.

## 2024-12-01 ENCOUNTER — Other Ambulatory Visit: Payer: Self-pay | Admitting: Family Medicine

## 2024-12-01 DIAGNOSIS — E66811 Obesity, class 1: Secondary | ICD-10-CM

## 2024-12-01 NOTE — Telephone Encounter (Unsigned)
 Copied from CRM [id number]. Topic: Clinical - Medication Refill >> Dec 01, 2024 11:42 AM Tinnie C wrote: Medication: phentermine  37.5 MG capsule  Has the patient contacted their pharmacy? No  This is the patient's preferred pharmacy:  [id number] - [place], [place] - [address] AT [facility] [address] [place] [address] Phone: [phone] Fax: [phone]  Is this the correct pharmacy for this prescription? Yes If no, delete pharmacy and type the correct one.   Has the prescription been filled recently? Yes  Is the patient out of the medication? 3 days   Has the patient been seen for an appointment in the last year OR does the patient have an upcoming appointment? Yes  Can we respond through MyChart? Yes, or call, either  Agent: Please be advised that Rx refills may take up to 3 business days. We ask that you follow-up with your pharmacy.

## 2024-12-02 ENCOUNTER — Other Ambulatory Visit: Payer: Self-pay | Admitting: Family Medicine

## 2024-12-02 DIAGNOSIS — E66811 Obesity, class 1: Secondary | ICD-10-CM

## 2024-12-20 ENCOUNTER — Other Ambulatory Visit: Payer: Self-pay | Admitting: Family Medicine

## 2024-12-22 ENCOUNTER — Other Ambulatory Visit: Payer: Self-pay

## 2024-12-22 DIAGNOSIS — Z79891 Long term (current) use of opiate analgesic: Secondary | ICD-10-CM

## 2024-12-22 MED ORDER — OXYCODONE HCL 20 MG PO TABS: 20.0000 mg | ORAL_TABLET | Freq: Four times a day (QID) | ORAL | 0 refills | Status: AC | PRN

## 2024-12-22 NOTE — Telephone Encounter (Signed)
 Copied from CRM [id number]. Topic: Clinical - Medication Refill >> Dec 22, 2024 10:49 AM Viola F wrote: Medication: Oxycodone  HCl 20 MG TABS [[id number]]  Has the patient contacted their pharmacy? Yes (Agent: If no, request that the patient contact the pharmacy for the refill. If patient does not wish to contact the pharmacy document the reason why and proceed with request.) (Agent: If yes, when and what did the pharmacy advise?)  This is the patient's preferred pharmacy:   [id number] - [place], [place] - [address] AT [facility] [address] [place] [address] Phone: [phone] Fax: [phone]  Is this the correct pharmacy for this prescription? Yes If no, delete pharmacy and type the correct one.   Has the prescription been filled recently? Yes  Is the patient out of the medication? No  Has the patient been seen for an appointment in the last year OR does the patient have an upcoming appointment? Yes  Can we respond through MyChart? Yes  Agent: Please be advised that Rx refills may take up to 3 business days. We ask that you follow-up with your pharmacy.

## 2024-12-28 ENCOUNTER — Other Ambulatory Visit: Payer: Self-pay

## 2024-12-28 DIAGNOSIS — E6609 Other obesity due to excess calories: Secondary | ICD-10-CM

## 2024-12-28 NOTE — Telephone Encounter (Signed)
 Copied from CRM [id number]. Topic: Clinical - Medication Refill >> Dec 28, 2024 11:47 AM Kevin Pena wrote: Medication: phentermine  37.5 MG capsule  Has the patient contacted their pharmacy? yes  This is the patient's preferred pharmacy:  [id number] - [place], [place] - [address] AT [facility] [address] [place] [address] Phone: [phone] Fax: [phone]  Is this the correct pharmacy for this prescription? Yes  Has the prescription been filled recently? No  Is the patient out of the medication? No  Has the patient been seen for an appointment in the last year OR does the patient have an upcoming appointment? Yes  Can we respond through MyChart? Yes  Agent: Please be advised that Rx refills may take up to 3 business days. We ask that you follow-up with your pharmacy.

## 2024-12-29 ENCOUNTER — Other Ambulatory Visit: Payer: Self-pay | Admitting: Family Medicine

## 2024-12-29 DIAGNOSIS — E782 Mixed hyperlipidemia: Secondary | ICD-10-CM

## 2024-12-29 MED ORDER — PHENTERMINE HCL 37.5 MG PO CAPS: 37.5000 mg | ORAL_CAPSULE | Freq: Every morning | ORAL | 0 refills | Status: AC

## 2025-01-18 ENCOUNTER — Ambulatory Visit: Admitting: Family Medicine

## 2025-05-11 ENCOUNTER — Ambulatory Visit
